# Patient Record
Sex: Male | Born: 1955 | Race: Black or African American | Hispanic: No | Smoking: Never smoker
Health system: Southern US, Community
[De-identification: ages and names within clinical notes are randomized; demographics above are authoritative.]

## PROBLEM LIST (undated history)

## (undated) DIAGNOSIS — I10 Essential (primary) hypertension: Secondary | ICD-10-CM

## (undated) DIAGNOSIS — I471 Supraventricular tachycardia, unspecified: Secondary | ICD-10-CM

## (undated) DIAGNOSIS — Z95810 Presence of automatic (implantable) cardiac defibrillator: Secondary | ICD-10-CM

## (undated) DIAGNOSIS — E785 Hyperlipidemia, unspecified: Secondary | ICD-10-CM

## (undated) DIAGNOSIS — H409 Unspecified glaucoma: Secondary | ICD-10-CM

## (undated) DIAGNOSIS — E119 Type 2 diabetes mellitus without complications: Secondary | ICD-10-CM

## (undated) DIAGNOSIS — J45909 Unspecified asthma, uncomplicated: Secondary | ICD-10-CM

## (undated) DIAGNOSIS — M549 Dorsalgia, unspecified: Secondary | ICD-10-CM

## (undated) DIAGNOSIS — I5022 Chronic systolic (congestive) heart failure: Secondary | ICD-10-CM

## (undated) DIAGNOSIS — I428 Other cardiomyopathies: Secondary | ICD-10-CM

## (undated) DIAGNOSIS — E669 Obesity, unspecified: Secondary | ICD-10-CM

## (undated) DIAGNOSIS — D869 Sarcoidosis, unspecified: Secondary | ICD-10-CM

## (undated) DIAGNOSIS — F32A Depression, unspecified: Secondary | ICD-10-CM

## (undated) DIAGNOSIS — F329 Major depressive disorder, single episode, unspecified: Secondary | ICD-10-CM

## (undated) DIAGNOSIS — I77810 Thoracic aortic ectasia: Secondary | ICD-10-CM

## (undated) DIAGNOSIS — G4730 Sleep apnea, unspecified: Secondary | ICD-10-CM

## (undated) DIAGNOSIS — I480 Paroxysmal atrial fibrillation: Secondary | ICD-10-CM

## (undated) DIAGNOSIS — I442 Atrioventricular block, complete: Secondary | ICD-10-CM

## (undated) HISTORY — PX: PACEMAKER INSERTION: SHX728

## (undated) HISTORY — PX: TONSILLECTOMY: SUR1361

## (undated) HISTORY — DX: Atrioventricular block, complete: I44.2

## (undated) HISTORY — DX: Unspecified glaucoma: H40.9

## (undated) HISTORY — DX: Depression, unspecified: F32.A

## (undated) HISTORY — DX: Essential (primary) hypertension: I10

## (undated) HISTORY — DX: Unspecified asthma, uncomplicated: J45.909

## (undated) HISTORY — DX: Obesity, unspecified: E66.9

## (undated) HISTORY — DX: Paroxysmal atrial fibrillation: I48.0

## (undated) HISTORY — DX: Supraventricular tachycardia: I47.1

## (undated) HISTORY — DX: Hyperlipidemia, unspecified: E78.5

## (undated) HISTORY — DX: Thoracic aortic ectasia: I77.810

## (undated) HISTORY — DX: Dorsalgia, unspecified: M54.9

## (undated) HISTORY — DX: Supraventricular tachycardia, unspecified: I47.10

## (undated) HISTORY — DX: Major depressive disorder, single episode, unspecified: F32.9

## (undated) HISTORY — DX: Sarcoidosis, unspecified: D86.9

## (undated) HISTORY — PX: HEMORRHOID SURGERY: SHX153

## (undated) HISTORY — DX: Type 2 diabetes mellitus without complications: E11.9

## (undated) HISTORY — DX: Other cardiomyopathies: I42.8

## (undated) HISTORY — DX: Chronic systolic (congestive) heart failure: I50.22

---

## 1997-04-10 ENCOUNTER — Encounter: Admission: RE | Admit: 1997-04-10 | Discharge: 1997-07-09 | Payer: Self-pay | Admitting: Internal Medicine

## 1999-07-30 ENCOUNTER — Emergency Department (HOSPITAL_COMMUNITY): Admission: EM | Admit: 1999-07-30 | Discharge: 1999-07-30 | Payer: Self-pay | Admitting: Emergency Medicine

## 1999-11-19 ENCOUNTER — Encounter: Payer: Self-pay | Admitting: Orthopedic Surgery

## 1999-11-19 ENCOUNTER — Encounter: Admission: RE | Admit: 1999-11-19 | Discharge: 1999-11-19 | Payer: Self-pay | Admitting: Orthopedic Surgery

## 2001-12-03 ENCOUNTER — Ambulatory Visit (HOSPITAL_BASED_OUTPATIENT_CLINIC_OR_DEPARTMENT_OTHER): Admission: RE | Admit: 2001-12-03 | Discharge: 2001-12-03 | Payer: Self-pay | Admitting: *Deleted

## 2002-07-11 ENCOUNTER — Encounter: Payer: Self-pay | Admitting: *Deleted

## 2002-07-11 ENCOUNTER — Encounter: Payer: Self-pay | Admitting: Emergency Medicine

## 2002-07-11 ENCOUNTER — Observation Stay (HOSPITAL_COMMUNITY): Admission: EM | Admit: 2002-07-11 | Discharge: 2002-07-12 | Payer: Self-pay | Admitting: Emergency Medicine

## 2002-12-25 ENCOUNTER — Ambulatory Visit (HOSPITAL_COMMUNITY): Admission: RE | Admit: 2002-12-25 | Discharge: 2002-12-25 | Payer: Self-pay | Admitting: Cardiology

## 2002-12-30 ENCOUNTER — Ambulatory Visit (HOSPITAL_COMMUNITY): Admission: RE | Admit: 2002-12-30 | Discharge: 2002-12-30 | Payer: Self-pay | Admitting: Gastroenterology

## 2003-01-17 ENCOUNTER — Emergency Department (HOSPITAL_COMMUNITY): Admission: EM | Admit: 2003-01-17 | Discharge: 2003-01-17 | Payer: Self-pay | Admitting: Emergency Medicine

## 2003-03-26 ENCOUNTER — Ambulatory Visit (HOSPITAL_BASED_OUTPATIENT_CLINIC_OR_DEPARTMENT_OTHER): Admission: RE | Admit: 2003-03-26 | Discharge: 2003-03-26 | Payer: Self-pay | Admitting: Internal Medicine

## 2003-05-21 ENCOUNTER — Encounter: Admission: RE | Admit: 2003-05-21 | Discharge: 2003-05-21 | Payer: Self-pay | Admitting: Internal Medicine

## 2003-06-17 ENCOUNTER — Encounter: Admission: RE | Admit: 2003-06-17 | Discharge: 2003-09-15 | Payer: Self-pay | Admitting: Internal Medicine

## 2003-10-23 ENCOUNTER — Encounter: Admission: RE | Admit: 2003-10-23 | Discharge: 2003-10-23 | Payer: Self-pay | Admitting: Internal Medicine

## 2004-02-22 ENCOUNTER — Emergency Department (HOSPITAL_COMMUNITY): Admission: EM | Admit: 2004-02-22 | Discharge: 2004-02-22 | Payer: Self-pay | Admitting: Emergency Medicine

## 2005-03-07 ENCOUNTER — Encounter: Admission: RE | Admit: 2005-03-07 | Discharge: 2005-03-07 | Payer: Self-pay | Admitting: Internal Medicine

## 2005-09-22 ENCOUNTER — Encounter: Admission: RE | Admit: 2005-09-22 | Discharge: 2005-09-22 | Payer: Self-pay | Admitting: Internal Medicine

## 2007-05-15 ENCOUNTER — Encounter: Admission: RE | Admit: 2007-05-15 | Discharge: 2007-05-15 | Payer: Self-pay | Admitting: Internal Medicine

## 2009-09-21 ENCOUNTER — Ambulatory Visit: Payer: Self-pay | Admitting: Internal Medicine

## 2009-09-21 ENCOUNTER — Inpatient Hospital Stay (HOSPITAL_COMMUNITY): Admission: EM | Admit: 2009-09-21 | Discharge: 2009-09-23 | Payer: Self-pay | Admitting: Emergency Medicine

## 2009-12-11 ENCOUNTER — Ambulatory Visit (HOSPITAL_COMMUNITY)
Admission: RE | Admit: 2009-12-11 | Discharge: 2009-12-12 | Payer: Self-pay | Source: Home / Self Care | Admitting: Cardiology

## 2010-03-22 LAB — GLUCOSE, CAPILLARY
Glucose-Capillary: 120 mg/dL — ABNORMAL HIGH (ref 70–99)
Glucose-Capillary: 152 mg/dL — ABNORMAL HIGH (ref 70–99)
Glucose-Capillary: 98 mg/dL (ref 70–99)

## 2010-03-22 LAB — SURGICAL PCR SCREEN
MRSA, PCR: NEGATIVE
Staphylococcus aureus: NEGATIVE

## 2010-03-25 LAB — GLUCOSE, CAPILLARY
Glucose-Capillary: 127 mg/dL — ABNORMAL HIGH (ref 70–99)
Glucose-Capillary: 140 mg/dL — ABNORMAL HIGH (ref 70–99)
Glucose-Capillary: 146 mg/dL — ABNORMAL HIGH (ref 70–99)
Glucose-Capillary: 189 mg/dL — ABNORMAL HIGH (ref 70–99)
Glucose-Capillary: 223 mg/dL — ABNORMAL HIGH (ref 70–99)

## 2010-03-25 LAB — POCT I-STAT, CHEM 8
BUN: 12 mg/dL (ref 6–23)
Calcium, Ion: 1.12 mmol/L (ref 1.12–1.32)
Chloride: 105 mEq/L (ref 96–112)
Creatinine, Ser: 1.1 mg/dL (ref 0.4–1.5)
Glucose, Bld: 294 mg/dL — ABNORMAL HIGH (ref 70–99)

## 2010-03-25 LAB — LIPID PANEL
Cholesterol: 134 mg/dL (ref 0–200)
Cholesterol: 139 mg/dL (ref 0–200)
HDL: 48 mg/dL (ref >39)
Total CHOL/HDL Ratio: 2.9 RATIO
VLDL: 25 mg/dL (ref 0–40)

## 2010-03-25 LAB — BRAIN NATRIURETIC PEPTIDE: Pro B Natriuretic peptide (BNP): 51 pg/mL (ref 0.0–100.0)

## 2010-03-25 LAB — CBC
Hemoglobin: 11.5 g/dL — ABNORMAL LOW (ref 13.0–17.0)
MCV: 77.8 fL — ABNORMAL LOW (ref 78.0–100.0)
RDW: 13.2 % (ref 11.5–15.5)

## 2010-03-25 LAB — TSH: TSH: 3.655 u[IU]/mL (ref 0.350–4.500)

## 2010-03-25 LAB — COMPREHENSIVE METABOLIC PANEL
Albumin: 3.5 g/dL (ref 3.5–5.2)
Alkaline Phosphatase: 104 U/L (ref 39–117)
BUN: 11 mg/dL (ref 6–23)
CO2: 25 mEq/L (ref 19–32)
Calcium: 8.7 mg/dL (ref 8.4–10.5)
GFR calc non Af Amer: >60 mL/min (ref >60)
Glucose, Bld: 287 mg/dL — ABNORMAL HIGH (ref 70–99)
Sodium: 133 mEq/L — ABNORMAL LOW (ref 135–145)
Total Protein: 7.3 g/dL (ref 6.0–8.3)

## 2010-03-25 LAB — HEMOGLOBIN A1C: Hgb A1c MFr Bld: 9.4 % — ABNORMAL HIGH (ref <5.7)

## 2010-03-25 LAB — POCT CARDIAC MARKERS
CKMB, poc: <1 ng/mL — ABNORMAL LOW (ref 1.0–8.0)
Myoglobin, poc: 41.6 ng/mL (ref 12–200)
Troponin i, poc: <0.05 ng/mL (ref 0.00–0.09)
Troponin i, poc: <0.05 ng/mL (ref 0.00–0.09)

## 2010-04-28 ENCOUNTER — Other Ambulatory Visit: Payer: Self-pay | Admitting: Orthopedic Surgery

## 2010-04-28 DIAGNOSIS — M545 Low back pain: Secondary | ICD-10-CM

## 2010-04-30 ENCOUNTER — Ambulatory Visit
Admission: RE | Admit: 2010-04-30 | Discharge: 2010-04-30 | Disposition: A | Discharge status: Home / Self Care | Payer: Medicare Other | Source: Ambulatory Visit | Attending: Orthopedic Surgery | Admitting: Orthopedic Surgery

## 2010-04-30 DIAGNOSIS — M545 Low back pain: Secondary | ICD-10-CM

## 2010-05-11 ENCOUNTER — Other Ambulatory Visit (HOSPITAL_COMMUNITY): Payer: Self-pay | Admitting: Neurosurgery

## 2010-05-11 DIAGNOSIS — M545 Low back pain, unspecified: Secondary | ICD-10-CM

## 2010-05-21 ENCOUNTER — Ambulatory Visit (HOSPITAL_COMMUNITY)
Admission: RE | Admit: 2010-05-21 | Discharge: 2010-05-21 | Disposition: A | Discharge status: Home / Self Care | Payer: Medicare Other | Source: Ambulatory Visit | Attending: Neurosurgery | Admitting: Neurosurgery

## 2010-05-21 DIAGNOSIS — M545 Low back pain: Secondary | ICD-10-CM

## 2010-05-21 DIAGNOSIS — M79609 Pain in unspecified limb: Secondary | ICD-10-CM | POA: Insufficient documentation

## 2010-05-21 DIAGNOSIS — M549 Dorsalgia, unspecified: Secondary | ICD-10-CM | POA: Insufficient documentation

## 2010-05-21 DIAGNOSIS — M48061 Spinal stenosis, lumbar region without neurogenic claudication: Secondary | ICD-10-CM | POA: Insufficient documentation

## 2010-05-21 LAB — GLUCOSE, CAPILLARY

## 2010-05-21 MED ORDER — IOHEXOL 180 MG/ML  SOLN
20.0000 mL | Freq: Once | INTRAMUSCULAR | Status: AC | PRN
  Administered 2010-05-21: 20 mL via INTRAVENOUS

## 2010-05-28 NOTE — H&P (Signed)
[name], [name]                      ACCOUNT NO.:  [id number]   MEDICAL RECORD NO.:  [id number]                   PATIENT TYPE:  OBV   LOCATION:  0347                                 FACILITY:  [facility]   PHYSICIAN:  Quita Skye. Waldon Reining, MD             DATE OF BIRTH:  [birth year]   DATE OF ADMISSION:  07/11/2002  DATE OF DISCHARGE:                                HISTORY & PHYSICAL   [name] is a 55 year old black male who was admitted to [facility] for further evaluation of chest pain.   The patient presented to the emergency department this evening with a one  week history of intermittent chest pain. The chest pain is described as a  pressure in the left anterior chest. It does not radiate. It is participated  by activity and is relieved with rest in approximately five minutes. He had  not experienced any episode at rest. Episodes are associated with dyspnea  and nausea but no diaphoresis. He has no other exacerbating or alleviating  factors. He has experienced episodes once or twice a day for the last 10  days. They have increased slightly in frequency since the onset 10 days ago.  The patient experienced chest pain at this time. The episodes appear not to  be related to position, meals, or respirations.   The patient has no past history of chest pain, myocardial infarction,  coronary artery disease, or congestive heart failure. He had a cardiac  catheterization in 1996 which reportedly demonstrated no coronary artery  disease. He underwent pacemaker implantation several years ago; it is not  clear what reason the pacemaker was implanted. He has a history of  noninsulin-dependent diabetes mellitus and a family history of early  coronary artery disease (father suffers from coronary artery disease in his  80s). There is no history of hyperlipidemia, smoking, or hypertension.   The patient is disabled due to degenerative disk disease and due to his  pacemaker. He was employed as a Copy.   The patient also has a history of sarcoid.   He is allergic to penicillin.   His current medications include:  1. Tambocor 50 mg p.o. b.i.d.  2. Avandia 8 mg p.o. daily.   The patient lives with his wife and children.   Review of systems reveals no problems related to his head, eyes, ears, nose,  mouth, throat, lungs, gastrointestinal system, genitourinary system, or  extremities. There is no history of neurological or psychiatric disorder.  There is no history of fever, chills, or weight loss.   PHYSICAL EXAMINATION:  VITAL SIGNS:  Blood pressure 160/98, pulse 104 and  regular, respirations 20, temperature 97.8.  GENERAL:  The patient was an obese, middle-aged, black male in no  discomfort. He is alert, oriented, appropriate, and responsive.  HEAD, EYES, EARS, NOSE AND MOUTH:  Normal.  NECK:  Without thyromegaly or adenopathy. Carotid pulses were palpable  bilaterally  without bruits.  CARDIAC:  Revealed a normal S1 and S2. There was no S3, S4, murmur, rub, or  click. Cardiac rhythm was regular. No chest wall tenderness was noted.  LUNGS:  Clear.  ABDOMEN:  Soft and nontender. There was no mass, hepatosplenomegaly, bruit,  distention, rebound, guarding, or rigidity. Bowel sounds were normal.  RECTAL/GENITAL EXAMINATIONS:  Not performed as they were not pertinent for  the reason for acute care hospitalization.  EXTREMITIES:  Without edema, deviation, or deformity. Radial and dorsalis  pedal pulses were palpable bilaterally.  NEUROLOGICAL:  Brief screening neurological survey was unremarkable.   Electrocardiogram revealed a paced rhythm. The chest radiograph report was  pending at the time of this dictation. Initial CK was 65 with a CK-MB of 1.0  and a troponin of 0.02. White count was 5.2 with a hemoglobin of 13.2 and  hematocrit 39.5. Potassium 3.7, BUN 11, creatinine 1.1. The remaining  studies were pending at the time  of this dictation.   IMPRESSION:  1. Chest pain, rule out coronary artery disease. The chest pain is described     as a left anterior chest pressure, participated by exertion and relieved     with rest in approximately five minutes. It has occurred once or twice a     day for the last 10 days. This history is consistent with angina.  2. Status post permanent pacemaker.  3. Noninsulin-dependent diabetes mellitus.  4. Sarcoid.   PLAN:  1. Telemetry.  2. Serial cardiac enzymes.  3. Aspirin.  4. Nitro paste.  5. Heparin.  6. Additional measures per Dr. Fraser Din.                                               Quita Skye. Waldon Reining, MD    MSC/MEDQ  D:  07/11/2002  T:  07/11/2002  Job:  [id number]   cc:   Meade Maw, M.D.  [address]  [place]  [place] [address]  Fax: [phone]

## 2010-05-28 NOTE — Op Note (Signed)
NAMEDELAINE, CANTER                      ACCOUNT NO.:  [id number]   MEDICAL RECORD NO.:  [id number]                   PATIENT TYPE:  OIB   LOCATION:  2899                                 FACILITY:  [facility]   PHYSICIAN:  Francisca December, M.D.               DATE OF BIRTH:  1955/12/06   DATE OF PROCEDURE:  12/25/2002  DATE OF DISCHARGE:                                 OPERATIVE REPORT   ADDENDUM:  The pacemaker representative has reassessed his interpretation of  the pacing data and in fact, the pacing threshold for the atrial lead is 0.5  V, 0.5 msec pulse width.  This results in a current at capture threshold of  1.6 mA, given that the lead impedance was 387 ohms.  Resulting longevity  assessment is greater than 10 years.                                               Francisca December, M.D.    JHE/MEDQ  D:  12/25/2002  T:  12/25/2002  Job:  [id number]

## 2010-05-28 NOTE — Op Note (Signed)
NAMERIGBY, [name]                      ACCOUNT NO.:  [id number]   MEDICAL RECORD NO.:  [id number]                   PATIENT TYPE:  AMB   LOCATION:  ENDO                                 FACILITY:  [facility]   PHYSICIAN:  Danise Edge, M.D.                DATE OF BIRTH:  December 14, 1955   DATE OF PROCEDURE:  12/30/2002  DATE OF DISCHARGE:                                 OPERATIVE REPORT   PROCEDURE:  Diagnostic colonoscopy.   PROCEDURE INDICATION:  [name] is a 55 year old male, born  [birth year].  [name] has guaiac positive stool and is referred for  his first diagnostic colonoscopy with polypectomy to prevent colon cancer.   ENDOSCOPIST:  Charolett Bumpers, M.D.   PREMEDICATION:  1. Versed 7.5 mg.  2. Demerol 100 mg.   DESCRIPTION OF PROCEDURE:  After obtaining informed consent, [name]  was placed in the left lateral decubitus position.  I administered  intravenous Demerol and intravenous Versed to achieve conscious sedation for  the procedure.  The patient's blood pressure, oxygen saturation, and cardiac  rhythm were monitored throughout the procedure and documented in the medical  record.   Anal inspection was normal.  Digital rectal examination was normal.  The  prostate was nonnodular.  The Olympus pediatric adjustable colonoscope was  introduced into the rectum and easily advanced to the cecum.  Colonic  preparation for the exam today was excellent.   RECTUM:  Normal.  SIGMOID COLON AND DESCENDING COLON:  Normal.  SPLENIC FLEXURE:  Normal.  TRANSVERSE COLON:  Normal.  HEPATIC FLEXURE:  Normal.  ASCENDING COLON:  Normal.  CECUM AND ILEOCECAL VALVE:  Normal.   ASSESSMENT:  1. Normal diagnostic proctocolonoscopy to the cecum.  2. No endoscopic evidence for the presence of colorectal neoplasia or     inflammatory bowel disease.                                               Danise Edge, M.D.    MJ/MEDQ  D:  12/30/2002  T:   12/30/2002  Job:  [id number]   cc:   Lilla Shook, M.D.  [address]  [place]  [place] [address]  Fax: [phone]

## 2010-05-28 NOTE — Discharge Summary (Signed)
[name], [name]                      ACCOUNT NO.:  [id number]   MEDICAL RECORD NO.:  [id number]                   PATIENT TYPE:  OBV   LOCATION:  0368                                 [facility]ACILITY:  [facility]   PHYSICIAN:  [name], M.D.                 DATE OF BIRTH:  1955/05/15   DATE OF ADMISSION:  07/11/2002  DATE OF DISCHARGE:  07/12/2002                                 DISCHARGE SUMMARY   PRIMARY CARE PHYSICIAN:  [name], M.D.   PROCEDURES:  A.  2D echocardiogram results detailed below.  B.  On (July 11, 2002) adenosine Cardiolite which was negative for ischemia,  ejection fraction of 20%.  Inferior scar.   DISCHARGE DIAGNOSES:  1. Cardiomyopathy with ejection fraction approximately 20%.  Ejection     fraction question related to sarcoidosis.  Adenosine Cardiolite this     admission was negative for evidence for ischemia.  Possible inferior     scar.  Negative cardiac valvular abnormalities.  No pericardial effusion.  2. Left atrium diameter okay at 41 millimeters.  Trivial TR.  Trivial MR.     Right ventricle not well-visualized; right atrium grossly normal.  The     patient ruled out for myocardial infarction by negative serial enzymes.     Negative congestive heart failure on chest x-ray though cardiac     enlargement.  The patient states prior cardiac catheterization in 1996     was negative for coronary artery disease.  3. Obesity with height 70 inches, weight 315 pounds.  4. Diabetes mellitus type 2.  Serum glucose this admission 339 to 184.  5. Dyslipidemia in setting of coronary artery disease; cholesterol 191,     triglycerides elevated at 156, HDL 39, LDL 121.  Further management     anticipated as an outpatient.  6. History of dual chamber pacemaker; the patient is a poor historian.  He     is on Tambocor.  Question prior ablation for supraventricular     tachyrhythmias.  7. History of sarcoid.   PLAN:  1. The patient discharged to home in  stable condition.  2. Options discussed with the patient; he wishes to have further workup as     an outpatient.  This includes plans for a left heart catheterization     assessing for evidence of coronary artery disease and right heart     catheterization for evaluation of pulmonary hypertension and hemodynamic     data.  3. Enroll in congestive heart failure clinic.  4. Medical management; continue aspirin 325 mg per day, initiation of Coreg     3.125 mg p.o. b.i.d. (new).  Continue Tambocor 15 mg p.o. b.i.d. and     Avandia 8 mg p.o. daily as prior to admission.   DISCHARGE ACTIVITIES:  No strenuous activity; no lifting more than 10  pounds.   FOLLOW UP:  1. The patient to call our  office after discharge for scheduling heart     catheterization.  2. Anticipate enrollment in our congestive heart failure clinic; he will     call our office for that appointment [phone]).  3. Follow up with rheumatology [name], as an outpatient.   HISTORY OF PRESENT ILLNESS:  [name] is a 55 year old gentleman  admitted to [name] by Dr. Darol Walsh for evaluation of  chest pain.  He has a history of obesity and sarcoid.  History of pacemaker  placement for which the patient was not clear on indication.  He is on  Tambocor.   The patient had been experiencing left anterior chest pressure, non  radiating, but precipitated by activity and relieved with rest.  Associated  dyspnea and nausea, but no diaphoresis.  These episodes were occurring daily  to b.i.d. for the last 10 days prior to admission, increasing in frequency.   The patient was admitted to telemetry and initiated on aspirin, intravenous  heparin, nitro paste.  He was given a total of 80 mEq of potassium for  admission potassium of 3.7.  Admission chest x-ray was negative for  congestive heart failure, but positive cardiomegaly.  He ruled out for a  myocardial infarction by negative serial  cardiac enzymes.  The patient had  no further chest pain during the course of his admission.  An followup  adenosine Cardiolite was negative for ischemia, but did reveal an ejection  fraction of 20% with inferior scar.  Options were discussed with the  patient.  [name] wished to have further workup as an outpatient.  Anticipate he will return for a left and right heart catheterization.  Dr.  Fraser Walsh intended for enrollment of patient in her congestive heart failure  clinic and follow up with rheumatology.  Discharge diagnosis of  cardiomyopathy, questionably related to sarcoidosis.  Dr. Fraser Walsh noted  inability to add calcium channel blockers to the patient's medical regimen  secondary to his taking Tambocor.   A followup 2D echocardiogram was performed which was negative for  pericardial effusion; only trivial MR; TR otherwise no significant cardiac  valvular abnormality.  Right atrium within normal size with inadequate  visualization of right ventricle.  Ejection fraction was estimated 15-20%.  Study was inadequate for left ventricular wall motion.  LV wall thickness  was normal.  There was no pericardial effusion.  Left atrial diameter was  okay at 41 millimeters.  End diastolic left ventricular diameter was 57.5  with left ventricular diameter and systolic 47.2.   LABORATORY DATA:  WBC 5.2, hemoglobin 13.2, hematocrit 39.5, platelets 263.  Differential within normal range.  Pro time 13.3, INR 1, PTT 31.  Sodium  136, potassium 3.7, chloride 106, CO2 24, glucose 339, subsequently 184.  BUN 11, creatinine 1.1.  Liver function tests all within normal range.  Serial cardiac enzymes including CK/MB and troponin were all totally within  normal range.  Serum cholesterol of 191, triglyceride slightly elevated at  156, HDL 39, LDL 121.  EKG revealed ventricularly paced rhythm, some atrial paced beats of varying PR intervals, question of underlying AV heart block.  Chest x-ray July 11, 2002 revealed cardiac enlargement.  Dual lead pacemaker  in place with leads in the region of right atrium and right ventricle.  No  infiltrate or effusion.   PRECYCLE HISTORY:  As above.       [name], N.P.  [name], M.D.    MES/MEDQ  D:  09/07/2002  T:  09/08/2002  Job:  [id number]   cc:   Thayer Headings, M.D.  Int. Med. - Resident - [address]  [place], [place] [address]  Fax: [phone]

## 2010-05-28 NOTE — Op Note (Signed)
[name], [name]                      ACCOUNT NO.:  [id number]   MEDICAL RECORD NO.:  [id number]                   PATIENT TYPE:  OIB   LOCATION:  2899                                 FACILITY:  [facility]   PHYSICIAN:  [name], M.D.               DATE OF BIRTH:  [birth year]   DATE OF PROCEDURE:  12/25/2002  DATE OF DISCHARGE:                                 OPERATIVE REPORT   PROCEDURE PERFORMED:  1. Temporary transvenous pacemaker.  2. Removal of old pacing generator.  3. Insertion new pacing generator dual chamber.  4. Lead testing.   INDICATIONS FOR PROCEDURE:  [name] is a pleasant 55 year old man with  a history of atrial flutter, who was status post ablation and subsequently  required permanent pacemaker insertion approximately seven years ago.  It is  now at elective replacement interval.  He has a nonischemic cardiomyopathy  with an ejection fraction of 15 to 20% by echocardiogram, July 2004.  He is  brought now to the cardiac catheterization laboratory for removal and  insertion of a new pacing generator.  Because he is pacemaker dependent, he  will have a transvenous pacemaker placed from the right groin temporarily.   DESCRIPTION OF PROCEDURE:  The patient was brought to the cardiac  catheterization laboratory in a fasting state.  The right groin and left  prepectoral region was prepped and draped in the usual sterile fashion.  Local anesthesia was obtained with infiltration of 1% lidocaine.  A 6 French  catheter sheath was inserted percutaneously into the right femoral artery  utilizing an anterior approach over a guiding J-wire.  A 100 cm balloon flow  directed temporary transvenous pacemaker was then manipulated into the right  ventricular apex using fluoroscopy where adequate pacing parameters were  obtained.  The pacing generator was placed at a rate of 55 beats per minute  on standby.  After changing gown and glove, attention was directed to the  left prepectoral region.  There again local anesthesia was obtained with  infiltration of 1% lidocaine with epinephrine throughout.  A 7 cm incision  was then made along the previous pacemaker scar.  This was carried down by  sharp dissection to the pacemaker capsule.  The capsule was incised and the  pacemaker subsequently delivered without difficulty.  The leads were  detached from the pacing generator.  The patient was stable on temporary  pacing at that time.  Each lead was tested with results as noted below,  which were adequate.  The pocket was then copiously irrigated using 1%  Kanamycin solution.  The leads were then attached to the pacing generator  and the generator then tested for security.  Each lead was identified by its  serial number and placed in the appropriate receptacle.  Each was tightened  and placed carefully and tested for security.  The pacemaker generator was  then placed in a pocket and the pocket  was inspected for bleeding.  None was  found.  The pocket was then closed using 2-0 Dexon in a running fashion for  the subcutaneous layer.  The skin was approximated using 5-0 Dexon in a  running subcuticular fashion.  Steri-Strips and a sterile dressing were  applied and the patient was transported to recovery area in stable condition  in an A sense V pace mode.   EQUIPMENT DATA:  The old pacing generator is a Production assistant, radio [id number],  serial [id number].  The new pulse generator is a Designer, jewellery model [id number],  serial [id number], Identity [id number].  The atrial lead is a Medtronic model  [id number], serial [id number]. The ventricular lead is a Medtronic model [id number],  serial [id number].   PACING DATA:  The right atrial lead had a pacing threshold of 1.5 V.  The  resistance was 540 ohms at a pulse width of 0.5 msec.  The P wave amplitude  detected was 2 mV.  The right ventricular lead had a pacing threshold of  0.5 V at 0.5 msec.  The resistance was 580 ohms.  This  resulted in a current  at capture threshold of 0.9 mA.  The lead was not tested for R wave  sensitivity since the patient was in complete heart block and temporarily  pacing.  Finally, the current at capture threshold for the right atrial lead  was 3 mA.                                               [name], M.D.    JHE/MEDQ  D:  12/25/2002  T:  12/25/2002  Job:  [id number]   cc:   [name], M.D.  [address]  [place], [place] [address]  Fax: [phone]

## 2010-05-31 ENCOUNTER — Other Ambulatory Visit: Payer: Self-pay | Admitting: Neurosurgery

## 2010-05-31 DIAGNOSIS — M47816 Spondylosis without myelopathy or radiculopathy, lumbar region: Secondary | ICD-10-CM

## 2010-06-02 ENCOUNTER — Ambulatory Visit
Admission: RE | Admit: 2010-06-02 | Discharge: 2010-06-02 | Disposition: A | Discharge status: Home / Self Care | Payer: Medicare Other | Source: Ambulatory Visit | Attending: Neurosurgery | Admitting: Neurosurgery

## 2010-06-02 ENCOUNTER — Other Ambulatory Visit: Payer: Self-pay | Admitting: Neurosurgery

## 2010-06-02 DIAGNOSIS — M47816 Spondylosis without myelopathy or radiculopathy, lumbar region: Secondary | ICD-10-CM

## 2010-06-16 ENCOUNTER — Other Ambulatory Visit: Payer: Medicare Other

## 2010-07-15 ENCOUNTER — Ambulatory Visit
Admission: RE | Admit: 2010-07-15 | Discharge: 2010-07-15 | Disposition: A | Discharge status: Home / Self Care | Payer: Medicare Other | Source: Ambulatory Visit | Attending: Neurosurgery | Admitting: Neurosurgery

## 2010-07-15 DIAGNOSIS — M47816 Spondylosis without myelopathy or radiculopathy, lumbar region: Secondary | ICD-10-CM

## 2011-03-28 ENCOUNTER — Encounter: Payer: Self-pay | Admitting: Internal Medicine

## 2011-03-28 ENCOUNTER — Ambulatory Visit (INDEPENDENT_AMBULATORY_CARE_PROVIDER_SITE_OTHER): Payer: Medicare Other | Admitting: Internal Medicine

## 2011-03-28 VITALS — BP 120/82 | HR 74 | Wt 173.8 lb

## 2011-03-28 DIAGNOSIS — I428 Other cardiomyopathies: Secondary | ICD-10-CM

## 2011-03-28 DIAGNOSIS — Z4502 Encounter for adjustment and management of automatic implantable cardiac defibrillator: Secondary | ICD-10-CM

## 2011-03-28 DIAGNOSIS — Z95810 Presence of automatic (implantable) cardiac defibrillator: Secondary | ICD-10-CM | POA: Insufficient documentation

## 2011-03-28 DIAGNOSIS — I442 Atrioventricular block, complete: Secondary | ICD-10-CM

## 2011-03-28 DIAGNOSIS — I5022 Chronic systolic (congestive) heart failure: Secondary | ICD-10-CM

## 2011-03-28 LAB — ICD DEVICE OBSERVATION
AL AMPLITUDE: 2.1 mv
AL IMPEDENCE ICD: 410 Ohm
AL THRESHOLD: 0.5 V
ATRIAL PACING ICD: 6.4 pct
DEVICE MODEL ICD: 629155
VENTRICULAR PACING ICD: 99 pct

## 2011-03-28 NOTE — Assessment & Plan Note (Signed)
Today had discussed the importance of weight loss with the patient and his wife. He has seen a nutritionist. He is not exercising. [facility] In addition he is instructed to reduce the fat in his diet.

## 2011-03-28 NOTE — Assessment & Plan Note (Signed)
His symptoms are class 2-3. He will continue his current medical therapy, maintain a low-sodium diet, and he has been asked to lose weight.

## 2011-03-28 NOTE — Patient Instructions (Addendum)
Remote monitoring is used to monitor your Pacemaker of ICD from home. This monitoring reduces the number of office visits required to check your device to one time per year. It allows us to keep an eye on the functioning of your device to ensure it is working properly. You are scheduled for a device check from home on June 30, 2011. You may send your transmission at any time that day. If you have a wireless device, the transmission will be sent automatically. After your physician reviews your transmission, you will receive a postcard with your next transmission date.  Your physician wants you to follow-up in: 1 year with Dr Taylor. You will receive a reminder letter in the mail two months in advance. If you don't receive a letter, please call our office to schedule the follow-up appointment.  

## 2011-03-28 NOTE — Assessment & Plan Note (Signed)
His device is working normally. He does not have a functioning left ventricular lead. 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 Because of his comorbidities, I would not recommend an attempt at placing a left ventricular lead at this time. We'll plan to recheck his device in several months.

## 2011-03-28 NOTE — Progress Notes (Signed)
HPI [name] is referred today by Dr. [name] for ongoing evaluation and management of his ICD. The patient has a long-standing nonischemic cardiomyopathy and chronic congestive heart failure. He also has high-grade heart block and is status post initial pacemaker insertion. He had an upgrade to an ICD several years ago. Attempts to place a left ventricular lead were unsuccessful. Despite RV pacing, his heart failure has been fairly well controlled. He has class II symptoms. He is limited by his obesity and arthritis. The patient denies chest pain. He has class III symptoms but is limited more by arthritis including back pain and knee pain. He admits to dietary indiscretion. He has had no recent ICD shock. He denies peripheral edema. No syncope. Allergies  Allergen Reactions  . Penicillins Other (See Comments)    dizziness     Current Outpatient Prescriptions  Medication Sig Dispense Refill  . albuterol (PROVENTIL HFA;VENTOLIN HFA) 108 (90 BASE) MCG/ACT inhaler Inhale 2 puffs into the lungs every 6 (six) hours as needed.      Marland Kitchen aspirin 81 MG tablet Take 81 mg by mouth daily.      . carvedilol (COREG) 25 MG tablet Take 25 mg by mouth 2 (two) times daily with a meal.      . furosemide (LASIX) 20 MG tablet Take 20 mg by mouth daily.      Marland Kitchen glimepiride (AMARYL) 2 MG tablet Take 2 mg by mouth daily before breakfast.      . insulin glargine (LANTUS) 100 UNIT/ML injection Inject into the skin as directed. 100 units and then 50 units      . insulin lispro (HUMALOG) 100 UNIT/ML injection Inject 100 Units into the skin as needed.      . simvastatin (ZOCOR) 40 MG tablet Take 40 mg by mouth every evening.      . valsartan (DIOVAN) 320 MG tablet Take 320 mg by mouth daily.         Past Medical History  Diagnosis Date  . DM (diabetes mellitus)   . Nonischemic cardiomyopathy     EF of 15%  . HTN (hypertension)   . Hyperlipidemia   . Sarcoid   . Obesity   . CHF (congestive heart failure)   .  Back pain     ROS:   All systems reviewed and negative except as noted in the HPI.   Past Surgical History  Procedure Date  . Pacemaker insertion      No family history on file.   History   Social History  . Marital Status: Married    Spouse Name: N/A    Number of Children: N/A  . Years of Education: N/A   Occupational History  . Not on file.   Social History Main Topics  . Smoking status: Never Smoker   . Smokeless tobacco: Not on file  . Alcohol Use: Yes     occasionally  . Drug Use: No  . Sexually Active: Not on file   Other Topics Concern  . Not on file   Social History Narrative  . No narrative on file     BP 120/82  Pulse 74  Wt 78.835 kg (173 lb 12.8 oz)  Physical Exam:  Obese, middle-aged but otherwise well appearing man, NAD HEENT: Unremarkable Neck:  7 cm JVD, no thyromegally Lungs:  Clear with minimal rales in the bases. No wheezes or rhonchi. HEART:  Regular rate rhythm, no murmurs, no rubs, no clicks Abd:  Obese, soft, positive bowel sounds,  no organomegally, no rebound, no guarding Ext:  2 plus pulses, 1+ peripheral edema, no cyanosis, no clubbing Skin:  No rashes no nodules Neuro:  CN II through XII intact, motor grossly intact  DEVICE  Normal device function.  See PaceArt for details.   Assess/Plan:

## 2011-06-30 ENCOUNTER — Encounter: Payer: Medicare Other | Admitting: *Deleted

## 2011-07-11 ENCOUNTER — Encounter: Payer: Self-pay | Admitting: *Deleted

## 2011-07-18 ENCOUNTER — Ambulatory Visit (INDEPENDENT_AMBULATORY_CARE_PROVIDER_SITE_OTHER): Payer: Medicare Other | Admitting: *Deleted

## 2011-07-18 ENCOUNTER — Telehealth: Payer: Self-pay | Admitting: Internal Medicine

## 2011-07-18 ENCOUNTER — Encounter: Payer: Self-pay | Admitting: Internal Medicine

## 2011-07-18 DIAGNOSIS — Z4502 Encounter for adjustment and management of automatic implantable cardiac defibrillator: Secondary | ICD-10-CM

## 2011-07-18 DIAGNOSIS — I5022 Chronic systolic (congestive) heart failure: Secondary | ICD-10-CM

## 2011-07-18 NOTE — Telephone Encounter (Signed)
Please return call to patient at [phone] or [phone] regarding pacer ck questions.

## 2011-07-19 NOTE — Telephone Encounter (Signed)
Spoke with patient, he will be released from [facility] then we can process his latest transmission for MD to read.  We will send him a letter informing him of results along with next transmission date.

## 2011-07-22 LAB — REMOTE ICD DEVICE
AL AMPLITUDE: 2 mv
ATRIAL PACING ICD: 9.8 pct
BAMS-0003: 70 {beats}/min
BRDY-0002RV: 60 {beats}/min
DEVICE MODEL ICD: 629155
RV LEAD IMPEDENCE ICD: 380 Ohm
TZAT-0012SLOWVT: 200 ms
TZAT-0013SLOWVT: 3
TZAT-0019SLOWVT: 7.5 V
TZAT-0020SLOWVT: 1 ms
TZON-0004SLOWVT: 16
TZON-0005SLOWVT: 6
TZST-0001SLOWVT: 4
TZST-0003SLOWVT: 36 J

## 2011-08-05 ENCOUNTER — Encounter: Payer: Self-pay | Admitting: *Deleted

## 2011-08-27 ENCOUNTER — Emergency Department (HOSPITAL_COMMUNITY)
Admission: EM | Admit: 2011-08-27 | Discharge: 2011-08-27 | Disposition: A | Discharge status: Home / Self Care | Payer: Medicare Other | Attending: Emergency Medicine | Admitting: Emergency Medicine

## 2011-08-27 ENCOUNTER — Encounter (HOSPITAL_COMMUNITY): Payer: Self-pay | Admitting: *Deleted

## 2011-08-27 ENCOUNTER — Emergency Department (HOSPITAL_COMMUNITY): Payer: Medicare Other

## 2011-08-27 DIAGNOSIS — Z7982 Long term (current) use of aspirin: Secondary | ICD-10-CM | POA: Insufficient documentation

## 2011-08-27 DIAGNOSIS — E785 Hyperlipidemia, unspecified: Secondary | ICD-10-CM | POA: Insufficient documentation

## 2011-08-27 DIAGNOSIS — I509 Heart failure, unspecified: Secondary | ICD-10-CM | POA: Insufficient documentation

## 2011-08-27 DIAGNOSIS — E119 Type 2 diabetes mellitus without complications: Secondary | ICD-10-CM | POA: Insufficient documentation

## 2011-08-27 DIAGNOSIS — M549 Dorsalgia, unspecified: Secondary | ICD-10-CM

## 2011-08-27 DIAGNOSIS — Z794 Long term (current) use of insulin: Secondary | ICD-10-CM | POA: Insufficient documentation

## 2011-08-27 DIAGNOSIS — Z880 Allergy status to penicillin: Secondary | ICD-10-CM | POA: Insufficient documentation

## 2011-08-27 DIAGNOSIS — I10 Essential (primary) hypertension: Secondary | ICD-10-CM | POA: Insufficient documentation

## 2011-08-27 DIAGNOSIS — Z79899 Other long term (current) drug therapy: Secondary | ICD-10-CM | POA: Insufficient documentation

## 2011-08-27 DIAGNOSIS — R109 Unspecified abdominal pain: Secondary | ICD-10-CM | POA: Insufficient documentation

## 2011-08-27 LAB — URINALYSIS, ROUTINE W REFLEX MICROSCOPIC
Bilirubin Urine: NEGATIVE
Nitrite: NEGATIVE
Specific Gravity, Urine: 1.019 (ref 1.005–1.030)
Urobilinogen, UA: 0.2 mg/dL (ref 0.0–1.0)

## 2011-08-27 MED ORDER — CYCLOBENZAPRINE HCL 10 MG PO TABS: 10.0000 mg | ORAL_TABLET | Freq: Two times a day (BID) | ORAL | Status: AC | PRN

## 2011-08-27 MED ORDER — HYDROCODONE-ACETAMINOPHEN 5-500 MG PO TABS: 1.0000 | ORAL_TABLET | Freq: Four times a day (QID) | ORAL | Status: AC | PRN

## 2011-08-27 NOTE — ED Provider Notes (Signed)
Medical screening examination/treatment/procedure(s) were performed by non-physician practitioner and as supervising physician I was immediately available for consultation/collaboration.   Amahia Madonia, MD 08/27/11 1459 

## 2011-08-27 NOTE — ED Notes (Signed)
NAD noted at time of d/c home 

## 2011-08-27 NOTE — ED Provider Notes (Signed)
History     CSN: [id number]  Arrival date & time 08/27/11  0941   First MD Initiated Contact with Patient 08/27/11 1110      Chief Complaint  Patient presents with  . Flank Pain    (Consider location/radiation/quality/duration/timing/severity/associated sxs/prior treatment) Patient is a 56 y.o. male presenting with flank pain. The history is provided by the patient.  Flank Pain This is a new problem. The current episode started in the past 7 days. The problem occurs constantly. The problem has been unchanged. Associated symptoms include coughing. Pertinent negatives include no abdominal pain, chest pain, chills, fever, nausea, neck pain, numbness, urinary symptoms or weakness. The symptoms are aggravated by bending and twisting. He has tried NSAIDs for the symptoms. The treatment provided mild relief.  Pt states pain in the right flank/ mid back. States noted it when woke up 5 days ago. Pain radiates into right hip. No abdominal pain, no urinary symptoms. Does have a cough. NO fever, chills, malaise. No known injuries. No hx of the same.   Past Medical History  Diagnosis Date  . DM (diabetes mellitus)   . Nonischemic cardiomyopathy     EF of 15%  . HTN (hypertension)   . Hyperlipidemia   . Sarcoid   . Obesity   . CHF (congestive heart failure)   . Back pain     Past Surgical History  Procedure Date  . Pacemaker insertion     History reviewed. No pertinent family history.  History  Substance Use Topics  . Smoking status: Never Smoker   . Smokeless tobacco: Not on file  . Alcohol Use: Yes     occasionally      Review of Systems  Constitutional: Negative for fever and chills.  HENT: Negative for neck pain and neck stiffness.   Respiratory: Positive for cough.   Cardiovascular: Negative for chest pain.  Gastrointestinal: Negative for nausea and abdominal pain.  Genitourinary: Positive for flank pain. Negative for dysuria, urgency and hematuria.  Musculoskeletal:  Positive for back pain.  Skin: Negative.   Neurological: Negative for dizziness, weakness and numbness.    Allergies  Penicillins  Home Medications   Current Outpatient Rx  Name Route Sig Dispense Refill  . ALBUTEROL SULFATE HFA 108 (90 BASE) MCG/ACT IN AERS Inhalation Inhale 2 puffs into the lungs every 6 (six) hours as needed. For shortness of breath    . ASPIRIN 81 MG PO TABS Oral Take 81 mg by mouth daily.    Marland Kitchen CARVEDILOL 25 MG PO TABS Oral Take 50 mg by mouth 2 (two) times daily with a meal.     . FUROSEMIDE 20 MG PO TABS Oral Take 20 mg by mouth every evening.     Marland Kitchen GLIMEPIRIDE 2 MG PO TABS Oral Take 2 mg by mouth daily at 12 noon.     . INSULIN GLARGINE 100 UNIT/ML [place] SOLN Subcutaneous Inject 50 Units into the skin 2 (two) times daily. 100 units and then 50 units    . INSULIN LISPRO (HUMAN) 100 UNIT/ML [place] SOLN Subcutaneous Inject 5-10 Units into the skin 3 (three) times daily before meals. Sliding scale    . OLOPATADINE HCL 0.2 % OP SOLN Ophthalmic Apply 1 drop to eye daily as needed. For runny eyes    . SIMVASTATIN 40 MG PO TABS Oral Take 40 mg by mouth every evening.    Marland Kitchen TRAMADOL HCL 50 MG PO TABS Oral Take 50 mg by mouth every 6 (six) hours as needed.  For pain    . VALSARTAN 320 MG PO TABS Oral Take 320 mg by mouth every evening.       BP 152/79  Pulse 80  Temp 98.4 F (36.9 C) (Oral)  Resp 20  SpO2 95%  Physical Exam  Nursing note and vitals reviewed. Constitutional: He is oriented to person, place, and time. He appears well-developed and well-nourished.       Morbidly obese  HENT:  Head: Normocephalic.  Cardiovascular: Normal rate and regular rhythm.   Pulmonary/Chest: Effort normal and breath sounds normal. No respiratory distress. He has no wheezes. He has no rales.  Abdominal: Soft. Bowel sounds are normal. He exhibits no distension. There is no tenderness. There is no rebound.       Obese, no CVA tenderness  Musculoskeletal: Normal range of motion. He  exhibits no edema.       No pain with bilateral straight leg raise. Full ROM at hip joint as allowed by pts body habitus. No tenderness over thoracic or lumbar spine midline, or paraspinal muscles. Normal dorsal pedal pulses bilat  Neurological: He is alert and oriented to person, place, and time.       5/5 and equal LE strength, 5/5 strength with dorsiflexion, plantar flexions of feet bilat. 2+ patellar reflexes bilat  Skin: Skin is warm and dry.  Psychiatric: He has a normal mood and affect.    ED Course  Procedures (including critical care time)  Pt with right mid back pain, reproduced with twisting of the torso and leaning forward. No neuro deficits. Normal LE sensation and strength. No abdominal pain. No urinary symptoms. No red flags to suggest cauda equina. No fever. WIll get UA and CXR.   Results for orders placed during the hospital encounter of 08/27/11  URINALYSIS, ROUTINE W REFLEX MICROSCOPIC      Component Value Range   Color, Urine YELLOW  YELLOW   APPearance CLEAR  CLEAR   Specific Gravity, Urine 1.019  1.005 - 1.030   pH 5.5  5.0 - 8.0   Glucose, UA NEGATIVE  NEGATIVE mg/dL   Hgb urine dipstick NEGATIVE  NEGATIVE   Bilirubin Urine NEGATIVE  NEGATIVE   Ketones, ur NEGATIVE  NEGATIVE mg/dL   Protein, ur NEGATIVE  NEGATIVE mg/dL   Urobilinogen, UA 0.2  0.0 - 1.0 mg/dL   Nitrite NEGATIVE  NEGATIVE   Leukocytes, UA NEGATIVE  NEGATIVE   Dg Chest 2 View  08/27/2011  *RADIOLOGY REPORT*  Clinical Data: Right-sided flank pain for 5 days.  History of pacemaker/defibrillator.  Diabetes.  Hypertension.  CHF.  CHEST - 2 VIEW  Comparison: 12/12/2009  Findings: Pacer / AICD device.  Leads unchanged position, without lead discontinuity.  Midline trachea.  Moderate cardiomegaly. No pleural effusion or pneumothorax.  Mild left base scarring suspected. No congestive failure.  IMPRESSION: Cardiomegaly, without congestive failure or acute disease.  Original Report Authenticated By: Consuello Bossier, M.D.    12:52 PM UA negative. CXR shows cardiomegaly with no acute findings. Pt non toxic, no acute distress. No abdominal pain or tenderness, no neuro deficits, normal distal pulses. I suspect pt's pain is muscular, will try muscle relaxant, pain medications, and follow up with pcp in 2 days.     1. Back pain       MDM          Lottie Mussel, PA 08/27/11 1255

## 2011-08-27 NOTE — ED Notes (Addendum)
Pt states pain started on Monday in right flank, denies urinary burning/urgency/frequency/blood. States pain 'comes and goes'. Pain radiates into right hip/RLQ/pelvis. Pt states OTC aleve helps with the pain

## 2011-08-27 NOTE — ED Notes (Signed)
Pt reports right flank/back pain that started on Monday, denies injury to back, denies urinary symptoms.

## 2011-10-24 ENCOUNTER — Ambulatory Visit (INDEPENDENT_AMBULATORY_CARE_PROVIDER_SITE_OTHER): Payer: Medicare Other | Admitting: *Deleted

## 2011-10-24 DIAGNOSIS — Z4502 Encounter for adjustment and management of automatic implantable cardiac defibrillator: Secondary | ICD-10-CM

## 2011-10-24 DIAGNOSIS — I5022 Chronic systolic (congestive) heart failure: Secondary | ICD-10-CM

## 2011-10-25 ENCOUNTER — Encounter: Payer: Self-pay | Admitting: Internal Medicine

## 2011-10-25 ENCOUNTER — Encounter: Payer: Self-pay | Admitting: *Deleted

## 2011-10-25 LAB — REMOTE ICD DEVICE
AL AMPLITUDE: 1.6 mv
BAMS-0001: 160 {beats}/min
DEVICE MODEL ICD: 629155
HV IMPEDENCE: 46 Ohm
RV LEAD IMPEDENCE ICD: 410 Ohm
TZAT-0001SLOWVT: 1
TZAT-0004SLOWVT: 8
TZON-0003SLOWVT: 350 ms
TZON-0010SLOWVT: 40 ms
TZST-0001SLOWVT: 3
TZST-0001SLOWVT: 5
TZST-0003SLOWVT: 25 J
TZST-0003SLOWVT: 40 J

## 2011-11-30 ENCOUNTER — Encounter: Payer: Self-pay | Admitting: *Deleted

## 2012-01-30 ENCOUNTER — Ambulatory Visit (INDEPENDENT_AMBULATORY_CARE_PROVIDER_SITE_OTHER): Payer: Medicare Other | Admitting: *Deleted

## 2012-01-30 ENCOUNTER — Encounter: Payer: Self-pay | Admitting: Internal Medicine

## 2012-01-30 DIAGNOSIS — I428 Other cardiomyopathies: Secondary | ICD-10-CM

## 2012-01-30 DIAGNOSIS — I5022 Chronic systolic (congestive) heart failure: Secondary | ICD-10-CM

## 2012-01-30 DIAGNOSIS — Z4502 Encounter for adjustment and management of automatic implantable cardiac defibrillator: Secondary | ICD-10-CM

## 2012-01-30 LAB — REMOTE ICD DEVICE
AL AMPLITUDE: 0.5 mv
AL IMPEDENCE ICD: 410 Ohm
BAMS-0003: 70 {beats}/min
BRDY-0002RV: 60 {beats}/min
BRDY-0003RV: 120 {beats}/min
RV LEAD IMPEDENCE ICD: 410 Ohm
TZAT-0012SLOWVT: 200 ms
TZAT-0013SLOWVT: 3
TZAT-0018SLOWVT: NEGATIVE
TZAT-0019SLOWVT: 7.5 V
TZAT-0020SLOWVT: 1 ms
TZON-0004SLOWVT: 16
TZON-0005SLOWVT: 6
TZST-0001SLOWVT: 2
TZST-0001SLOWVT: 4
TZST-0003SLOWVT: 36 J

## 2012-02-09 ENCOUNTER — Encounter: Payer: Self-pay | Admitting: *Deleted

## 2012-04-12 ENCOUNTER — Encounter: Payer: Self-pay | Admitting: Internal Medicine

## 2012-04-12 ENCOUNTER — Ambulatory Visit (INDEPENDENT_AMBULATORY_CARE_PROVIDER_SITE_OTHER): Payer: Medicare Other | Admitting: Internal Medicine

## 2012-04-12 VITALS — BP 152/87 | HR 72 | Ht 71.0 in | Wt 365.4 lb

## 2012-04-12 DIAGNOSIS — Z4502 Encounter for adjustment and management of automatic implantable cardiac defibrillator: Secondary | ICD-10-CM

## 2012-04-12 DIAGNOSIS — I5022 Chronic systolic (congestive) heart failure: Secondary | ICD-10-CM

## 2012-04-12 LAB — ICD DEVICE OBSERVATION
AL AMPLITUDE: 1.5 mv
BAMS-0001: 160 {beats}/min
CHARGE TIME: 9.5 s
DEVICE MODEL ICD: 629155
FVT: 0
HV IMPEDENCE: 43 Ohm
MODE SWITCH EPISODES: 19
RV LEAD AMPLITUDE: 12 mv
RV LEAD THRESHOLD: 0.625 V
TOT-0007: 1
TOT-0010: 8
TZAT-0001SLOWVT: 1
TZAT-0018SLOWVT: NEGATIVE
TZAT-0019SLOWVT: 7.5 V
TZON-0003SLOWVT: 350 ms
TZON-0004SLOWVT: 16
TZON-0010SLOWVT: 40 ms
TZST-0001SLOWVT: 2
TZST-0001SLOWVT: 4
TZST-0003SLOWVT: 25 J
TZST-0003SLOWVT: 40 J
VENTRICULAR PACING ICD: 99.53 pct
VF: 0

## 2012-04-12 NOTE — Assessment & Plan Note (Signed)
The patient remains morbidly obese. I discussed with him the importance of weight loss, and maintain a low-sodium diet.

## 2012-04-12 NOTE — Patient Instructions (Signed)
Remote monitoring is used to monitor your Pacemaker of ICD from home. This monitoring reduces the number of office visits required to check your device to one time per year. It allows [place] to keep an eye on the functioning of your device to ensure it is working properly. You are scheduled for a device check from home on 07/16/12. You may send your transmission at any time that day. If you have a wireless device, the transmission will be sent automatically. After your physician reviews your transmission, you will receive a postcard with your next transmission date.  Your physician wants you to follow-up in: 1 year with Dr. Ladona Ridgel. You will receive a reminder letter in the mail two months in advance. If you don't receive a letter, please call our office to schedule the follow-up appointment.  Your physician recommends that you continue on your current medications as directed. Please refer to the Current Medication list given to you today.

## 2012-04-12 NOTE — Assessment & Plan Note (Signed)
His [employer] biventricular device with dual-chamber pacing is working normally. He does not have a left ventricular lead. We'll plan to recheck in several months. His device demonstrates elevation of his fluid index, and I've asked the patient increase his dose of diuretic for 3 days.

## 2012-04-12 NOTE — Assessment & Plan Note (Signed)
Despite his massive obesity, his heart failure symptoms remain class II.he will continue his current medical therapy, and I've encouraged the patient to maintain a low-sodium diet.

## 2012-04-12 NOTE — Progress Notes (Signed)
HPI [name] returns today for followup. He is a very pleasant 57 year old man with morbid obesity, a nonischemic cardiomyopathy, complete heart block, status post ICD implantation. When his initial ICD was placed, the patient was unable to receive a left ventricular lead. In the interim, he has been stable. He denies chest pain or shortness of breath. He has minimal peripheral edema. He denies syncope. He is frustrated by his inability to lose and keep off weight. Allergies  Allergen Reactions  . Penicillins Other (See Comments)    dizziness     Current Outpatient Prescriptions  Medication Sig Dispense Refill  . albuterol (PROVENTIL HFA;VENTOLIN HFA) 108 (90 BASE) MCG/ACT inhaler Inhale 2 puffs into the lungs every 6 (six) hours as needed. For shortness of breath      . aspirin 81 MG tablet Take 81 mg by mouth daily.      . carvedilol (COREG) 25 MG tablet Take 50 mg by mouth 2 (two) times daily with a meal.       . furosemide (LASIX) 20 MG tablet Take 20 mg by mouth every evening.       Marland Kitchen glimepiride (AMARYL) 2 MG tablet Take 2 mg by mouth daily at 12 noon.       . insulin glargine (LANTUS) 100 UNIT/ML injection Inject 50 Units into the skin 2 (two) times daily. 100 units and then 50 units      . insulin lispro (HUMALOG) 100 UNIT/ML injection Inject 5-10 Units into the skin 3 (three) times daily before meals. Sliding scale      . Olopatadine HCl (PATADAY) 0.2 % SOLN Apply 1 drop to eye daily as needed. For runny eyes      . simvastatin (ZOCOR) 40 MG tablet Take 40 mg by mouth every evening.      . traMADol (ULTRAM) 50 MG tablet Take 50 mg by mouth every 6 (six) hours as needed. For pain      . valsartan (DIOVAN) 320 MG tablet Take 320 mg by mouth every evening.        No current facility-administered medications for this visit.     Past Medical History  Diagnosis Date  . DM (diabetes mellitus)   . Nonischemic cardiomyopathy     EF of 15%  . HTN (hypertension)   . Hyperlipidemia    . Sarcoid   . Obesity   . CHF (congestive heart failure)   . Back pain     ROS:   All systems reviewed and negative except as noted in the HPI.   Past Surgical History  Procedure Laterality Date  . Pacemaker insertion       No family history on file.   History   Social History  . Marital Status: Married    Spouse Name: N/A    Number of Children: N/A  . Years of Education: N/A   Occupational History  . Not on file.   Social History Main Topics  . Smoking status: Never Smoker   . Smokeless tobacco: Not on file  . Alcohol Use: Yes     Comment: occasionally  . Drug Use: No  . Sexually Active: Not on file   Other Topics Concern  . Not on file   Social History Narrative  . No narrative on file     BP 152/87  Pulse 72  Ht 5\' 11"  (1.803 m)  Wt 365 lb 6.4 oz (165.744 kg)  BMI 50.99 kg/m2  Physical Exam:  Morbidly obese appearing NAD HEENT:  Unremarkable Neck:  7 cm JVD, no thyromegally Lungs:  Clear except for scattered basilar rales. Decreased breath sounds. HEART:  Regular rate rhythm, no murmurs, no rubs, no clicks, decreased heart sounds. Abd:  soft, obese,positive bowel sounds, no organomegally, no rebound, no guarding Ext:  2 plus pulses, no edema, no cyanosis, no clubbing Skin:  No rashes no nodules Neuro:  CN II through XII intact, motor grossly intact  EKG  DEVICE  Normal device function.  See PaceArt for details.   Assess/Plan:

## 2012-05-14 ENCOUNTER — Other Ambulatory Visit: Payer: Self-pay | Admitting: Gastroenterology

## 2012-06-27 ENCOUNTER — Encounter (HOSPITAL_COMMUNITY): Payer: Self-pay | Admitting: Pharmacy Technician

## 2012-06-28 ENCOUNTER — Encounter (HOSPITAL_COMMUNITY): Payer: Self-pay | Admitting: *Deleted

## 2012-06-28 DIAGNOSIS — G4730 Sleep apnea, unspecified: Secondary | ICD-10-CM

## 2012-06-28 HISTORY — DX: Sleep apnea, unspecified: G47.30

## 2012-07-16 ENCOUNTER — Encounter: Payer: Self-pay | Admitting: Internal Medicine

## 2012-07-16 ENCOUNTER — Ambulatory Visit (INDEPENDENT_AMBULATORY_CARE_PROVIDER_SITE_OTHER): Payer: Medicare Other | Admitting: *Deleted

## 2012-07-16 DIAGNOSIS — I5022 Chronic systolic (congestive) heart failure: Secondary | ICD-10-CM

## 2012-07-16 DIAGNOSIS — Z4502 Encounter for adjustment and management of automatic implantable cardiac defibrillator: Secondary | ICD-10-CM

## 2012-07-16 LAB — REMOTE ICD DEVICE
AL AMPLITUDE: 2.1 mv
BAMS-0001: 160 {beats}/min
BAMS-0003: 70 {beats}/min
DEVICE MODEL ICD: 629155
HV IMPEDENCE: 43 Ohm
RV LEAD AMPLITUDE: 7 mv
TZAT-0012SLOWVT: 200 ms
TZAT-0013SLOWVT: 3
TZST-0001SLOWVT: 2
TZST-0001SLOWVT: 5
TZST-0003SLOWVT: 36 J
VENTRICULAR PACING ICD: 100 pct

## 2012-07-17 ENCOUNTER — Ambulatory Visit (HOSPITAL_COMMUNITY): Payer: Medicare Other | Admitting: Anesthesiology

## 2012-07-17 ENCOUNTER — Encounter (HOSPITAL_COMMUNITY): Payer: Self-pay | Admitting: Anesthesiology

## 2012-07-17 ENCOUNTER — Encounter (HOSPITAL_COMMUNITY)
Admission: RE | Disposition: A | Discharge status: Home / Self Care | Payer: Self-pay | Source: Ambulatory Visit | Attending: Gastroenterology

## 2012-07-17 ENCOUNTER — Encounter (HOSPITAL_COMMUNITY): Payer: Self-pay | Admitting: *Deleted

## 2012-07-17 ENCOUNTER — Ambulatory Visit (HOSPITAL_COMMUNITY)
Admission: RE | Admit: 2012-07-17 | Discharge: 2012-07-17 | Disposition: A | Discharge status: Home / Self Care | Payer: Medicare Other | Source: Ambulatory Visit | Attending: Gastroenterology | Admitting: Gastroenterology

## 2012-07-17 DIAGNOSIS — I459 Conduction disorder, unspecified: Secondary | ICD-10-CM | POA: Insufficient documentation

## 2012-07-17 DIAGNOSIS — Z95810 Presence of automatic (implantable) cardiac defibrillator: Secondary | ICD-10-CM | POA: Insufficient documentation

## 2012-07-17 DIAGNOSIS — G4733 Obstructive sleep apnea (adult) (pediatric): Secondary | ICD-10-CM | POA: Insufficient documentation

## 2012-07-17 DIAGNOSIS — I10 Essential (primary) hypertension: Secondary | ICD-10-CM | POA: Insufficient documentation

## 2012-07-17 DIAGNOSIS — Z1211 Encounter for screening for malignant neoplasm of colon: Secondary | ICD-10-CM | POA: Insufficient documentation

## 2012-07-17 DIAGNOSIS — I428 Other cardiomyopathies: Secondary | ICD-10-CM | POA: Insufficient documentation

## 2012-07-17 DIAGNOSIS — E119 Type 2 diabetes mellitus without complications: Secondary | ICD-10-CM | POA: Insufficient documentation

## 2012-07-17 DIAGNOSIS — E669 Obesity, unspecified: Secondary | ICD-10-CM | POA: Insufficient documentation

## 2012-07-17 DIAGNOSIS — E7800 Pure hypercholesterolemia, unspecified: Secondary | ICD-10-CM | POA: Insufficient documentation

## 2012-07-17 HISTORY — PX: COLONOSCOPY WITH PROPOFOL: SHX5780

## 2012-07-17 HISTORY — DX: Sleep apnea, unspecified: G47.30

## 2012-07-17 HISTORY — DX: Presence of automatic (implantable) cardiac defibrillator: Z95.810

## 2012-07-17 LAB — POCT I-STAT 4, (NA,K, GLUC, HGB,HCT)
Hemoglobin: 13.9 g/dL (ref 13.0–17.0)
Sodium: 143 mEq/L (ref 135–145)

## 2012-07-17 SURGERY — COLONOSCOPY WITH PROPOFOL
Anesthesia: Monitor Anesthesia Care

## 2012-07-17 MED ORDER — PROMETHAZINE HCL 25 MG/ML IJ SOLN: 6.2500 mg | INTRAMUSCULAR | Status: DC | PRN

## 2012-07-17 MED ORDER — KETAMINE HCL 10 MG/ML IJ SOLN
INTRAMUSCULAR | Status: DC | PRN
  Administered 2012-07-17: 20 mg via INTRAVENOUS
  Administered 2012-07-17: 10 mg via INTRAVENOUS

## 2012-07-17 MED ORDER — MIDAZOLAM HCL 5 MG/5ML IJ SOLN
INTRAMUSCULAR | Status: DC | PRN
  Administered 2012-07-17: 2 mg via INTRAVENOUS

## 2012-07-17 MED ORDER — SODIUM CHLORIDE 0.9 % IV SOLN: INTRAVENOUS | Status: DC

## 2012-07-17 MED ORDER — PROPOFOL INFUSION 10 MG/ML OPTIME
INTRAVENOUS | Status: DC | PRN
  Administered 2012-07-17: 180 ug/kg/min via INTRAVENOUS

## 2012-07-17 MED ORDER — LACTATED RINGERS IV SOLN
INTRAVENOUS | Status: DC
  Administered 2012-07-17: 1000 mL via INTRAVENOUS

## 2012-07-17 SURGICAL SUPPLY — 22 items

## 2012-07-17 NOTE — Anesthesia Preprocedure Evaluation (Addendum)
Anesthesia Evaluation  Patient identified by MRN, date of birth, ID band Patient awake    Reviewed: Allergy & Precautions, H&P , NPO status , Patient's Chart, lab work & pertinent test results  Airway Mallampati: III TM Distance: >3 FB Neck ROM: Full    Dental  (+) Teeth Intact and Dental Advisory Given   Pulmonary sleep apnea (Noncompliant with CPAP) ,  breath sounds clear to auscultation  Pulmonary exam normal       Cardiovascular hypertension, Pt. on medications +CHF + Cardiac Defibrillator Rhythm:Regular Rate:Normal  Prior cardiac ablation.   Neuro/Psych negative neurological ROS  negative psych ROS   GI/Hepatic negative GI ROS, Neg liver ROS,   Endo/Other  diabetes, Type 2, Insulin DependentMorbid obesity  Renal/GU negative Renal ROS  negative genitourinary   Musculoskeletal negative musculoskeletal ROS (+)   Abdominal (+) + obese,   Peds  Hematology negative hematology ROS (+)   Anesthesia Other Findings   Reproductive/Obstetrics                          Anesthesia Physical Anesthesia Plan  ASA: III  Anesthesia Plan: MAC   Post-op Pain Management:    Induction: Intravenous  Airway Management Planned: Simple Face Mask  Additional Equipment:   Intra-op Plan:   Post-operative Plan:   Informed Consent: I have reviewed the patients History and Physical, chart, labs and discussed the procedure including the risks, benefits and alternatives for the proposed anesthesia with the patient or authorized representative who has indicated his/her understanding and acceptance.   Dental advisory given  Plan Discussed with: CRNA  Anesthesia Plan Comments:         Anesthesia Quick Evaluation

## 2012-07-17 NOTE — H&P (Signed)
  Procedure: Screening colonoscopy.  History: The patient is a 57 year old male born 1955/08/01. The patient underwent a normal screening colonoscopy on 12/30/2002. The patient is scheduled to undergo a repeat screening colonoscopy.  The patient's BMI is 51. He has obstructive sleep apnea syndrome. He has a non-ischemic cardiomyopathy complicated by heart block. He has a cardiac pacemaker and cardiac defibrillator implanted.  The patient is at high-risk for sedation complications. Propofol will be used for conscious sedation for the procedure today.  Past medical history: Hypertension. Type 2 diabetes mellitus. Obesity. Nonischemic cardiomyopathy. Heart block. Obstructive sleep apnea syndrome. Hypercholesterolemia. Asthma. Glaucoma. Pulmonary sarcoidosis. Supraventricular tachycardia. Anxiety with depression. Bilateral hydrocele. Cervical disc disease. Hemorrhoid surgery  Medication allergies: Penicillin  Habits: The patient has never smoked cigarettes. He does not consume alcohol.  Exam: The patient is alert and lying comfortably on the Endoscopy stretcher. Abdomen is soft and nontender to palpation. Cardiac exam reveals a regular rhythm. Lungs are clear to auscultation.  Recommendations: Proceed with screening colonoscopy.

## 2012-07-17 NOTE — Op Note (Signed)
Procedure: Screening colonoscopy. Normal screening colonoscopy performed 12/30/2002.  Endoscopist: Danise Edge  Premedication: Propofol administered by anesthesia  Procedure: The patient was placed in the left lateral decubitus position. Anal inspection and digital rectal exam were normal. The Pentax pediatric colonoscope was introduced into the rectum and advanced to the cecum. A normal-appearing ileocecal valve and appendiceal orifice were identified. Colonic preparation for the exam today was good.  Rectum. Normal. Retroflex view of the distal rectum normal.  Sigmoid colon and descending colon. Normal.  Splenic flexure. Normal.  Transverse colon. Normal.  Hepatic flexure. Normal.  Ascending colon. Normal.  Cecum and ileocecal valve. Normal.  Assessment: Normal screening proctocolonoscopy to the cecum.  Recommendations: Schedule repeat screening colonoscopy in 10 years.

## 2012-07-17 NOTE — Transfer of Care (Signed)
Immediate Anesthesia Transfer of Care Note  Patient: [name]  Procedure(s) Performed: Procedure(s): COLONOSCOPY WITH PROPOFOL (N/A)  Patient Location: PACU and Endoscopy Unit  Anesthesia Type:MAC  Level of Consciousness: awake and alert   Airway & Oxygen Therapy: Patient Spontanous Breathing and Patient connected to face mask oxygen  Post-op Assessment: Report given to PACU RN and Post -op Vital signs reviewed and stable  Post vital signs: Reviewed and stable  Complications: No apparent anesthesia complications

## 2012-07-17 NOTE — Anesthesia Postprocedure Evaluation (Signed)
Anesthesia Post Note  Patient: [name]  Procedure(s) Performed: Procedure(s) (LRB): COLONOSCOPY WITH PROPOFOL (N/A)  Anesthesia type: MAC  Patient location: PACU  Post pain: Pain level controlled  Post assessment: Post-op Vital signs reviewed  Last Vitals:  Filed Vitals:   07/17/12 1235  BP: 112/87  Pulse:   Temp:   Resp: 18    Post vital signs: Reviewed  Level of consciousness: sedated  Complications: No apparent anesthesia complications

## 2012-07-18 ENCOUNTER — Encounter (HOSPITAL_COMMUNITY): Payer: Self-pay | Admitting: Gastroenterology

## 2012-07-20 ENCOUNTER — Encounter: Payer: Self-pay | Admitting: *Deleted

## 2012-10-22 ENCOUNTER — Ambulatory Visit (INDEPENDENT_AMBULATORY_CARE_PROVIDER_SITE_OTHER): Payer: Medicare Other | Admitting: *Deleted

## 2012-10-22 DIAGNOSIS — Z4502 Encounter for adjustment and management of automatic implantable cardiac defibrillator: Secondary | ICD-10-CM

## 2012-10-22 DIAGNOSIS — I5022 Chronic systolic (congestive) heart failure: Secondary | ICD-10-CM

## 2012-10-24 ENCOUNTER — Encounter: Payer: Self-pay | Admitting: Internal Medicine

## 2012-10-24 LAB — REMOTE ICD DEVICE
AL AMPLITUDE: 2.1 mv
BAMS-0001: 160 {beats}/min
BAMS-0003: 70 {beats}/min
BRDY-0002RV: 60 {beats}/min
DEVICE MODEL ICD: 629155
HV IMPEDENCE: 44 Ohm
RV LEAD AMPLITUDE: 12 mv
TZAT-0004SLOWVT: 8
TZAT-0012SLOWVT: 200 ms
TZAT-0013SLOWVT: 3
TZAT-0019SLOWVT: 7.5 V
TZAT-0020SLOWVT: 1 ms
TZON-0004SLOWVT: 16
TZON-0005SLOWVT: 6
TZST-0001SLOWVT: 3
TZST-0001SLOWVT: 4
TZST-0003SLOWVT: 25 J
TZST-0003SLOWVT: 36 J

## 2012-11-22 ENCOUNTER — Encounter: Payer: Self-pay | Admitting: Cardiology

## 2012-11-22 ENCOUNTER — Ambulatory Visit: Payer: Medicare Other | Admitting: Cardiology

## 2012-11-22 DIAGNOSIS — I442 Atrioventricular block, complete: Secondary | ICD-10-CM | POA: Insufficient documentation

## 2012-11-22 DIAGNOSIS — I10 Essential (primary) hypertension: Secondary | ICD-10-CM | POA: Insufficient documentation

## 2012-11-22 DIAGNOSIS — E785 Hyperlipidemia, unspecified: Secondary | ICD-10-CM | POA: Insufficient documentation

## 2012-11-22 DIAGNOSIS — I471 Supraventricular tachycardia: Secondary | ICD-10-CM | POA: Insufficient documentation

## 2012-11-22 DIAGNOSIS — I428 Other cardiomyopathies: Secondary | ICD-10-CM | POA: Insufficient documentation

## 2012-11-29 ENCOUNTER — Encounter: Payer: Self-pay | Admitting: Cardiology

## 2012-11-29 ENCOUNTER — Ambulatory Visit (INDEPENDENT_AMBULATORY_CARE_PROVIDER_SITE_OTHER): Payer: Medicare Other | Admitting: Cardiology

## 2012-11-29 VITALS — BP 134/90 | HR 78 | Ht 70.0 in | Wt 352.0 lb

## 2012-11-29 DIAGNOSIS — I428 Other cardiomyopathies: Secondary | ICD-10-CM

## 2012-11-29 DIAGNOSIS — I471 Supraventricular tachycardia: Secondary | ICD-10-CM

## 2012-11-29 DIAGNOSIS — I442 Atrioventricular block, complete: Secondary | ICD-10-CM

## 2012-11-29 DIAGNOSIS — I5022 Chronic systolic (congestive) heart failure: Secondary | ICD-10-CM

## 2012-11-29 DIAGNOSIS — I10 Essential (primary) hypertension: Secondary | ICD-10-CM

## 2012-11-29 DIAGNOSIS — Z4502 Encounter for adjustment and management of automatic implantable cardiac defibrillator: Secondary | ICD-10-CM

## 2012-11-29 DIAGNOSIS — I498 Other specified cardiac arrhythmias: Secondary | ICD-10-CM

## 2012-11-29 NOTE — Progress Notes (Addendum)
[address] [place], [place]  [address] Phone: [phone] Fax:  [phone]  Date:  11/29/2012   ID:  [name], DOB December 10, 1955, MRN [id number]  PCP:  Georgann Housekeeper, MD  Cardiologist:  [name], MD     History of Present Illness: [name] is a 57 y.o. male with a history of nonischemic DCM, Sarcoidosis, chronic systolic CHF, HTN, CHB s/p AICD, and SVT s/p ablation.  He is doing well.  He denies any chest pain, SOB, DOE, palpitations, Dizziness or syncope . He has chronic LE edema which he says is very well controlled.    Wt Readings from Last 3 Encounters:  11/29/12 352 lb (159.666 kg)  07/17/12 365 lb (165.563 kg)  07/17/12 365 lb (165.563 kg)     Past Medical History  Diagnosis Date  . DM (diabetes mellitus)   . Nonischemic cardiomyopathy     EF of 15%  . Sarcoid     "lung"-no problems  . Obesity   . CHF (congestive heart failure)   . Back pain   . Automatic implantable cardioverter-defibrillator in situ   . Sleep apnea 06-28-12    no cpap used-unable to tolerate  . HTN (hypertension)   . Complete heart block     s/p PPM 1997 with upgrade to AICD 2011  . Hyperlipidemia   . Asthma   . Glaucoma   . SVT (supraventricular tachycardia)     s/p ablation  . Depression     Current Outpatient Prescriptions  Medication Sig Dispense Refill  . acetaminophen (TYLENOL) 500 MG tablet Take 500 mg by mouth every 6 (six) hours as needed for pain.      Marland Kitchen albuterol (PROVENTIL HFA;VENTOLIN HFA) 108 (90 BASE) MCG/ACT inhaler Inhale 2 puffs into the lungs every 6 (six) hours as needed. For shortness of breath      . aspirin 81 MG tablet Take 81 mg by mouth daily.      . carvedilol (COREG) 25 MG tablet Take 50 mg by mouth 2 (two) times daily with a meal.       . furosemide (LASIX) 20 MG tablet Take 20 mg by mouth every evening.       . insulin glargine (LANTUS) 100 UNIT/ML injection Inject 50 Units into the skin 2 (two) times daily.       .  insulin lispro (HUMALOG) 100 UNIT/ML injection Inject 5-10 Units into the skin 3 (three) times daily before meals. Sliding scale      . Liraglutide (VICTOZA) 18 MG/3ML SOPN Inject 1.2 mg into the skin daily.      . Olopatadine HCl (PATADAY) 0.2 % SOLN Apply 1 drop to eye daily as needed (for runny eyes).       . simvastatin (ZOCOR) 40 MG tablet Take 40 mg by mouth every evening.      . valsartan (DIOVAN) 320 MG tablet Take 320 mg by mouth every evening.       . vitamin B-12 (CYANOCOBALAMIN) 250 MCG tablet Take 250 mcg by mouth daily.      . vitamin C (ASCORBIC ACID) 500 MG tablet Take 500 mg by mouth daily.      Marland Kitchen AFLURIA PRESERVATIVE FREE injection        No current facility-administered medications for this visit.    Allergies:    Allergies  Allergen Reactions  . Penicillins Other (See Comments)    dizziness    Social History:  The patient  reports that he  has never smoked. He does not have any smokeless tobacco history on file. He reports that he drinks alcohol. He reports that he does not use illicit drugs.   Family History:  The patient's family history includes Alzheimer's disease in his mother; CVA in his father; Diabetes in his brother, mother, and sister; Epilepsy in his sister; Heart failure in his father.   ROS:  Please see the history of present illness.      All other systems reviewed and negative.   PHYSICAL EXAM: VS:  BP 134/90  Pulse 78  Ht 5\' 10"  (1.778 m)  Wt 352 lb (159.666 kg)  BMI 50.51 kg/m2 Well nourished, well developed, in no acute distress HEENT: normal Neck: no JVD Cardiac:  normal S1, S2; RRR; no murmur Lungs:  clear to auscultation bilaterally, no wheezing, rhonchi or rales Abd: soft, nontender, no hepatomegaly Ext: no edema Skin: warm and dry Neuro:  CNs 2-12 intact, no focal abnormalities noted     EKG:  NSR with V pacing and PVC's  ASSESSMENT AND PLAN:  1. Nonischemic DCM 2.  HTN - borderline control  - continue Carvedilol/Diovan   -  check BMET 3. Complete heart block s/p PPM and then upgrade to AICD 4. Chronic systolic CHF- appears well compensated on exam  - continue Carvedilol/Lasix/Diovan  Followup with me in 6 months  Signed, [name], MD 11/29/2012 9:58 PM

## 2012-11-29 NOTE — Patient Instructions (Signed)
Your physician recommends that you continue on your current medications as directed. Please refer to the Current Medication list given to you today.  Your physician recommends that you go to the lab today for a BMET  Your physician wants you to follow-up in: 6 Months with Dr Turner You will receive a reminder letter in the mail two months in advance. If you don't receive a letter, please call our office to schedule the follow-up appointment.     

## 2012-11-30 ENCOUNTER — Encounter: Payer: Self-pay | Admitting: General Surgery

## 2012-11-30 LAB — BASIC METABOLIC PANEL
BUN: 9 mg/dL (ref 6–23)
Calcium: 9.4 mg/dL (ref 8.4–10.5)
Chloride: 106 mEq/L (ref 96–112)
Creatinine, Ser: 1 mg/dL (ref 0.4–1.5)
GFR: 103.68 mL/min (ref >60.00)

## 2013-01-24 ENCOUNTER — Encounter: Payer: Self-pay | Admitting: Internal Medicine

## 2013-01-24 ENCOUNTER — Encounter: Payer: Medicare Other | Admitting: *Deleted

## 2013-01-24 DIAGNOSIS — I5022 Chronic systolic (congestive) heart failure: Secondary | ICD-10-CM

## 2013-01-24 DIAGNOSIS — I442 Atrioventricular block, complete: Secondary | ICD-10-CM

## 2013-01-24 DIAGNOSIS — I428 Other cardiomyopathies: Secondary | ICD-10-CM

## 2013-01-24 LAB — MDC_IDC_ENUM_SESS_TYPE_REMOTE
Battery Remaining Longevity: 60 mo
Battery Remaining Percentage: 62 %
Battery Voltage: 2.95 V
Brady Statistic AP VS Percent: 1 %
Brady Statistic RA Percent Paced: 3.3 %
Brady Statistic RV Percent Paced: 95 %
HIGH POWER IMPEDANCE MEASURED VALUE: 41 Ohm
Lead Channel Impedance Value: 410 Ohm
Lead Channel Impedance Value: 410 Ohm
Lead Channel Pacing Threshold Amplitude: 0.5 V
Lead Channel Pacing Threshold Pulse Width: 0.5 ms
Lead Channel Sensing Intrinsic Amplitude: 2.1 mV
Lead Channel Setting Pacing Amplitude: 2 V
Lead Channel Setting Pacing Amplitude: 2 V
Lead Channel Setting Pacing Pulse Width: 0.5 ms
Lead Channel Setting Sensing Sensitivity: 2 mV
MDC IDC MSMT LEADCHNL RV PACING THRESHOLD AMPLITUDE: 0.625 V
MDC IDC MSMT LEADCHNL RV PACING THRESHOLD PULSEWIDTH: 0.5 ms
MDC IDC MSMT LEADCHNL RV SENSING INTR AMPL: 12 mV
MDC IDC PG SERIAL: 629155
MDC IDC SESS DTM: 20150115072136
MDC IDC STAT BRADY AP VP PERCENT: 4.6 %
MDC IDC STAT BRADY AS VP PERCENT: 91 %
MDC IDC STAT BRADY AS VS PERCENT: 2.6 %
Zone Setting Detection Interval: 310 ms
Zone Setting Detection Interval: 350 ms

## 2013-02-05 ENCOUNTER — Encounter: Payer: Self-pay | Admitting: *Deleted

## 2013-04-17 ENCOUNTER — Encounter: Payer: Self-pay | Admitting: Internal Medicine

## 2013-04-17 ENCOUNTER — Ambulatory Visit (INDEPENDENT_AMBULATORY_CARE_PROVIDER_SITE_OTHER): Payer: Medicare Other | Admitting: Internal Medicine

## 2013-04-17 VITALS — BP 113/75 | HR 81 | Ht 70.0 in | Wt 360.8 lb

## 2013-04-17 DIAGNOSIS — N529 Male erectile dysfunction, unspecified: Secondary | ICD-10-CM

## 2013-04-17 DIAGNOSIS — I428 Other cardiomyopathies: Secondary | ICD-10-CM

## 2013-04-17 DIAGNOSIS — I442 Atrioventricular block, complete: Secondary | ICD-10-CM

## 2013-04-17 DIAGNOSIS — I5022 Chronic systolic (congestive) heart failure: Secondary | ICD-10-CM

## 2013-04-17 LAB — MDC_IDC_ENUM_SESS_TYPE_INCLINIC
Battery Remaining Longevity: 57.6 mo
Brady Statistic RV Percent Paced: 96 %
Date Time Interrogation Session: 20150408180730
HighPow Impedance: 44.3045
Implantable Pulse Generator Serial Number: 629155
Lead Channel Impedance Value: 387.5 Ohm
Lead Channel Pacing Threshold Amplitude: 0.5 V
Lead Channel Pacing Threshold Amplitude: 0.5 V
Lead Channel Pacing Threshold Amplitude: 0.625 V
Lead Channel Pacing Threshold Pulse Width: 0.5 ms
Lead Channel Pacing Threshold Pulse Width: 0.5 ms
Lead Channel Pacing Threshold Pulse Width: 0.5 ms
Lead Channel Sensing Intrinsic Amplitude: 1.3 mV
Lead Channel Setting Pacing Amplitude: 2 V
Lead Channel Setting Pacing Amplitude: 2 V
Lead Channel Setting Sensing Sensitivity: 2 mV
MDC IDC MSMT LEADCHNL RA IMPEDANCE VALUE: 425 Ohm
MDC IDC MSMT LEADCHNL RV SENSING INTR AMPL: 12 mV
MDC IDC SET LEADCHNL RV PACING PULSEWIDTH: 0.5 ms
MDC IDC SET ZONE DETECTION INTERVAL: 310 ms
MDC IDC STAT BRADY RA PERCENT PACED: 3 %
Zone Setting Detection Interval: 350 ms

## 2013-04-17 MED ORDER — TADALAFIL 10 MG PO TABS: 10.0000 mg | ORAL_TABLET | Freq: Every day | ORAL | Status: DC | PRN

## 2013-04-17 NOTE — Progress Notes (Signed)
HPI [name] returns today for followup. He is a very pleasant 58 year old man with morbid obesity, a nonischemic cardiomyopathy, complete heart block, status post ICD implantation. When his initial ICD was placed, the patient was unable to receive a left ventricular lead. In the interim, he has been stable. He denies chest pain or shortness of breath. He has minimal peripheral edema. He denies syncope. He is frustrated by his inability to lose and keep off weight. He admits to dietary indiscretion. Finally, the patient complains of ED and is requesting medical therapy.  Allergies  Allergen Reactions  . Penicillins Other (See Comments)    dizziness     Current Outpatient Prescriptions  Medication Sig Dispense Refill  . acetaminophen (TYLENOL) 500 MG tablet Take 500 mg by mouth every 6 (six) hours as needed for pain.      Marland Kitchen AFLURIA PRESERVATIVE FREE injection Inject 0.25 mLs into the muscle daily.       Marland Kitchen albuterol (PROVENTIL HFA;VENTOLIN HFA) 108 (90 BASE) MCG/ACT inhaler Inhale 2 puffs into the lungs every 6 (six) hours as needed. For shortness of breath      . aspirin 81 MG tablet Take 81 mg by mouth daily.      . carvedilol (COREG) 25 MG tablet Take 50 mg by mouth 2 (two) times daily with a meal.       . furosemide (LASIX) 20 MG tablet Take 20 mg by mouth every evening.       . insulin glargine (LANTUS) 100 UNIT/ML injection Inject 50 Units into the skin 2 (two) times daily.       . Liraglutide (VICTOZA) 18 MG/3ML SOPN Inject 1.2 mg into the skin daily.      . Olopatadine HCl (PATADAY) 0.2 % SOLN Apply 1 drop to eye daily as needed (for runny eyes).       . simvastatin (ZOCOR) 40 MG tablet Take 40 mg by mouth every evening.      . valsartan (DIOVAN) 320 MG tablet Take 320 mg by mouth every evening.       . vitamin B-12 (CYANOCOBALAMIN) 250 MCG tablet Take 250 mcg by mouth daily.      . vitamin C (ASCORBIC ACID) 500 MG tablet Take 500 mg by mouth daily.      . tadalafil (CIALIS) 10 MG  tablet Take 1 tablet (10 mg total) by mouth daily as needed for erectile dysfunction.  10 tablet  3   No current facility-administered medications for this visit.     Past Medical History  Diagnosis Date  . DM (diabetes mellitus)   . Nonischemic cardiomyopathy     EF of 15%  . Sarcoid     "lung"-no problems  . Obesity   . CHF (congestive heart failure)   . Back pain   . Automatic implantable cardioverter-defibrillator in situ   . Sleep apnea 06-28-12    no cpap used-unable to tolerate  . HTN (hypertension)   . Complete heart block     s/p PPM 1997 with upgrade to AICD 2011  . Hyperlipidemia   . Asthma   . Glaucoma   . SVT (supraventricular tachycardia)     s/p ablation  . Depression     ROS:   All systems reviewed and negative except as noted in the HPI.   Past Surgical History  Procedure Laterality Date  . Pacemaker insertion    . Tonsillectomy    . Hemorrhoid surgery      early 20's  .  Colonoscopy with propofol N/A 07/17/2012    Procedure: COLONOSCOPY WITH PROPOFOL;  Surgeon: Charolett BumpersMartin K Johnson, MD;  Location: [facility];  Service: Endoscopy;  Laterality: N/A;     Family History  Problem Relation Age of Onset  . Diabetes Mother   . Alzheimer's disease Mother   . CVA Father   . Heart failure Father   . Epilepsy Sister   . Diabetes Brother   . Diabetes Sister      History   Social History  . Marital Status: Married    Spouse Name: N/A    Number of Children: N/A  . Years of Education: N/A   Occupational History  . Not on file.   Social History Main Topics  . Smoking status: Never Smoker   . Smokeless tobacco: Not on file  . Alcohol Use: Yes     Comment: occasionally  . Drug Use: No  . Sexual Activity: Yes   Other Topics Concern  . Not on file   Social History Narrative  . No narrative on file     BP 113/75  Pulse 81  Ht 5\' 10"  (1.778 m)  Wt 360 lb 12.8 oz (163.658 kg)  BMI 51.77 kg/m2  Physical Exam:  Morbidly obese appearing  NAD HEENT: Unremarkable Neck:  7 cm JVD, no thyromegally Lungs:  Clear except for scattered basilar rales. Decreased breath sounds. HEART:  Regular rate rhythm, no murmurs, no rubs, no clicks, decreased heart sounds. Abd:  soft, obese,positive bowel sounds, no organomegally, no rebound, no guarding Ext:  2 plus pulses, no edema, no cyanosis, no clubbing Skin:  No rashes no nodules Neuro:  CN II through XII intact, motor grossly intact   DEVICE  Normal device function.  See PaceArt for details.   Assess/Plan:

## 2013-04-17 NOTE — Assessment & Plan Note (Signed)
His symptoms are currently class 2. He is instructed to maintain a low sodium diet. I have asked the patient to continue his current meds and attempt to lose weight.

## 2013-04-17 NOTE — Assessment & Plan Note (Signed)
He has multiple reasons for this problem. I have asked him to avoid all nitrate continue substances. He will continue his current meds and will be prescribed Cialis.

## 2013-04-17 NOTE — Patient Instructions (Signed)
Remote monitoring is used to monitor your ICD from home. This monitoring reduces the number of office visits required to check your device to one time per year. It allows us to keep an eye on the functioning of your device to ensure it is working properly. You are scheduled for a device check from home on 07-22-2013. You may send your transmission at any time that day. If you have a wireless device, the transmission will be sent automatically. After your physician reviews your transmission, you will receive a postcard with your next transmission date.  Your physician recommends that you schedule a follow-up appointment in: 12 months with Dr.Taylor    

## 2013-04-17 NOTE — Assessment & Plan Note (Signed)
He admits to dietary indiscretion, eating high fat, sweet snacks in excess. I have asked the patient to lose weight and we have set a goal of 20 lbs. He is to avoid sweet snacks and walk every day.

## 2013-06-17 ENCOUNTER — Other Ambulatory Visit: Payer: Self-pay | Admitting: Cardiology

## 2013-06-20 ENCOUNTER — Telehealth: Payer: Self-pay | Admitting: Cardiology

## 2013-06-20 NOTE — Telephone Encounter (Signed)
Lm with leslie at [facility] to re-fax request and to call back if she has questions.

## 2013-06-20 NOTE — Telephone Encounter (Signed)
New message   Fax request sent on 5/20.    Checking to see if the fax request has been address .

## 2013-07-22 ENCOUNTER — Ambulatory Visit (INDEPENDENT_AMBULATORY_CARE_PROVIDER_SITE_OTHER): Payer: Medicare Other | Admitting: *Deleted

## 2013-07-22 DIAGNOSIS — I428 Other cardiomyopathies: Secondary | ICD-10-CM

## 2013-07-22 DIAGNOSIS — I5022 Chronic systolic (congestive) heart failure: Secondary | ICD-10-CM

## 2013-07-22 DIAGNOSIS — I442 Atrioventricular block, complete: Secondary | ICD-10-CM

## 2013-07-23 NOTE — Progress Notes (Signed)
Remote ICD transmission.   

## 2013-07-25 ENCOUNTER — Other Ambulatory Visit: Payer: Self-pay | Admitting: Cardiology

## 2013-07-25 LAB — MDC_IDC_ENUM_SESS_TYPE_REMOTE
Brady Statistic AP VS Percent: 1 %
Brady Statistic AS VP Percent: 97 %
Brady Statistic AS VS Percent: 1 %
Brady Statistic RA Percent Paced: 2.9 %
Brady Statistic RV Percent Paced: 99 %
Date Time Interrogation Session: 20150713062711
HighPow Impedance: 40 Ohm
Implantable Pulse Generator Serial Number: 629155
Lead Channel Impedance Value: 410 Ohm
Lead Channel Pacing Threshold Amplitude: 0.625 V
Lead Channel Pacing Threshold Pulse Width: 0.5 ms
Lead Channel Sensing Intrinsic Amplitude: 2.1 mV
Lead Channel Setting Sensing Sensitivity: 2 mV
MDC IDC MSMT BATTERY REMAINING LONGEVITY: 55 mo
MDC IDC MSMT BATTERY REMAINING PERCENTAGE: 57 %
MDC IDC MSMT BATTERY VOLTAGE: 2.93 V
MDC IDC MSMT LEADCHNL RA PACING THRESHOLD AMPLITUDE: 0.5 V
MDC IDC MSMT LEADCHNL RA PACING THRESHOLD PULSEWIDTH: 0.5 ms
MDC IDC MSMT LEADCHNL RV IMPEDANCE VALUE: 440 Ohm
MDC IDC MSMT LEADCHNL RV SENSING INTR AMPL: 5.4 mV
MDC IDC SET LEADCHNL RA PACING AMPLITUDE: 2 V
MDC IDC SET LEADCHNL RV PACING AMPLITUDE: 2 V
MDC IDC SET LEADCHNL RV PACING PULSEWIDTH: 0.5 ms
MDC IDC SET ZONE DETECTION INTERVAL: 310 ms
MDC IDC STAT BRADY AP VP PERCENT: 3.1 %
Zone Setting Detection Interval: 350 ms

## 2013-07-29 ENCOUNTER — Other Ambulatory Visit: Payer: Self-pay | Admitting: General Surgery

## 2013-07-29 DIAGNOSIS — R002 Palpitations: Secondary | ICD-10-CM

## 2013-07-30 ENCOUNTER — Encounter: Payer: Self-pay | Admitting: *Deleted

## 2013-07-30 ENCOUNTER — Encounter (INDEPENDENT_AMBULATORY_CARE_PROVIDER_SITE_OTHER): Payer: Medicare Other

## 2013-07-30 DIAGNOSIS — I428 Other cardiomyopathies: Secondary | ICD-10-CM

## 2013-07-30 DIAGNOSIS — R002 Palpitations: Secondary | ICD-10-CM

## 2013-07-30 DIAGNOSIS — I442 Atrioventricular block, complete: Secondary | ICD-10-CM

## 2013-07-30 NOTE — Progress Notes (Signed)
Patient ID: [name], male   DOB: November 15, 1955, 58 y.o.   MRN: [id number] [employer] 30 day cardiac event monitor applied to patient.

## 2013-08-16 ENCOUNTER — Encounter: Payer: Self-pay | Admitting: Cardiology

## 2013-08-21 ENCOUNTER — Encounter: Payer: Self-pay | Admitting: Internal Medicine

## 2013-09-03 ENCOUNTER — Telehealth: Payer: Self-pay | Admitting: Cardiology

## 2013-09-03 NOTE — Telephone Encounter (Signed)
Please let patient know that heart monitor showed paced rhythm

## 2013-09-03 NOTE — Telephone Encounter (Signed)
lmtrc

## 2013-09-06 ENCOUNTER — Encounter: Payer: Self-pay | Admitting: General Surgery

## 2013-09-06 NOTE — Telephone Encounter (Signed)
Released to pts mychart

## 2013-09-18 ENCOUNTER — Other Ambulatory Visit: Payer: Self-pay | Admitting: Internal Medicine

## 2013-10-23 ENCOUNTER — Encounter: Payer: Self-pay | Admitting: Internal Medicine

## 2013-10-23 ENCOUNTER — Ambulatory Visit (INDEPENDENT_AMBULATORY_CARE_PROVIDER_SITE_OTHER): Payer: Medicare Other | Admitting: *Deleted

## 2013-10-23 DIAGNOSIS — I429 Cardiomyopathy, unspecified: Secondary | ICD-10-CM

## 2013-10-23 DIAGNOSIS — I5022 Chronic systolic (congestive) heart failure: Secondary | ICD-10-CM

## 2013-10-23 DIAGNOSIS — I428 Other cardiomyopathies: Secondary | ICD-10-CM

## 2013-10-23 LAB — MDC_IDC_ENUM_SESS_TYPE_REMOTE
Battery Voltage: 2.93 V
Brady Statistic AP VP Percent: 3.7 %
Brady Statistic AP VS Percent: 1 %
Brady Statistic AS VP Percent: 96 %
Brady Statistic AS VS Percent: 1 %
Brady Statistic RA Percent Paced: 3.4 %
Brady Statistic RV Percent Paced: 99 %
Date Time Interrogation Session: 20151014070147
HighPow Impedance: 43 Ohm
Implantable Pulse Generator Serial Number: 629155
Lead Channel Impedance Value: 390 Ohm
Lead Channel Pacing Threshold Pulse Width: 0.5 ms
Lead Channel Sensing Intrinsic Amplitude: 1.1 mV
Lead Channel Sensing Intrinsic Amplitude: 12 mV
Lead Channel Setting Pacing Amplitude: 2 V
MDC IDC MSMT BATTERY REMAINING LONGEVITY: 52 mo
MDC IDC MSMT BATTERY REMAINING PERCENTAGE: 54 %
MDC IDC MSMT LEADCHNL RA IMPEDANCE VALUE: 410 Ohm
MDC IDC MSMT LEADCHNL RA PACING THRESHOLD AMPLITUDE: 0.5 V
MDC IDC MSMT LEADCHNL RA PACING THRESHOLD PULSEWIDTH: 0.5 ms
MDC IDC MSMT LEADCHNL RV PACING THRESHOLD AMPLITUDE: 0.625 V
MDC IDC SET LEADCHNL RA PACING AMPLITUDE: 2 V
MDC IDC SET LEADCHNL RV PACING PULSEWIDTH: 0.5 ms
MDC IDC SET LEADCHNL RV SENSING SENSITIVITY: 2 mV
MDC IDC SET ZONE DETECTION INTERVAL: 310 ms
Zone Setting Detection Interval: 350 ms

## 2013-10-23 NOTE — Progress Notes (Signed)
Remote ICD transmission.   

## 2013-10-25 ENCOUNTER — Other Ambulatory Visit: Payer: Self-pay

## 2013-10-25 MED ORDER — FUROSEMIDE 20 MG PO TABS: ORAL_TABLET | ORAL | Status: DC

## 2013-10-28 ENCOUNTER — Encounter: Payer: Self-pay | Admitting: *Deleted

## 2013-10-28 ENCOUNTER — Encounter: Payer: Medicare Other | Attending: Internal Medicine | Admitting: *Deleted

## 2013-10-28 DIAGNOSIS — Z713 Dietary counseling and surveillance: Secondary | ICD-10-CM | POA: Diagnosis not present

## 2013-10-28 DIAGNOSIS — E119 Type 2 diabetes mellitus without complications: Secondary | ICD-10-CM

## 2013-10-28 DIAGNOSIS — Z794 Long term (current) use of insulin: Secondary | ICD-10-CM | POA: Diagnosis not present

## 2013-10-28 NOTE — Patient Instructions (Signed)
Goals:  Follow Diabetes Meal Plan as instructed  Eat 3 meals and 2 snacks, every 3-5 hrs  Limit carbohydrate intake to 60 grams carbohydrate/meal  Limit carbohydrate intake to 30 grams carbohydrate/snack  Add lean protein foods to meals/snacks  Monitor glucose levels as instructed by your doctor  Aim for 30 mins of physical activity daily  Bring food record and glucose log to your next nutrition visit  Take insulin at 11:30 am and 11:30 pm

## 2013-10-28 NOTE — Progress Notes (Signed)
Diabetes Self-Management Education  Visit Type:    Appt. Start Time: 1400 Appt. End Time: 1500  10/28/2013  [name], identified by name and date of birth, is a 58 y.o. male with a diagnosis of Diabetes:  .  Other people present during visit:  Patient   ASSESSMENT   Initial Visit Information:  Are you currently following a meal plan?: No   Are you taking your medications as prescribed?: Yes Are you checking your feet?: No   How often do you need to have someone help you when you read instructions, pamphlets, or other written materials from your doctor or pharmacy?: 1 - Never What is the last grade level you completed in school?: high school diploma  Psychosocial:     Patient Belief/Attitude about Diabetes: Other (comment) Self-management support: None Other persons present: Patient Patient Concerns: Nutrition/Meal planning;Medication;Monitoring;Healthy Lifestyle;Weight Control;Support Special Needs: None Preferred Learning Style: Visual;Hands on Learning Readiness: Ready  Complications:   How often do you check your blood sugar?: 3-4 times/day Fasting Blood glucose range (mg/dL): 53-664 Postprandial Blood glucose range (mg/dL): 403-474 Number of hypoglycemic episodes per month: 6 Number of hyperglycemic episodes per week: 1 Have you had a dental exam in the past 12 months?: No  Diet Intake:  Breakfast: banana and ritz crackers; oatmeal; biscuit and coffee Snack (morning): not usually Lunch: chicken salad or sandwich with carrots; burger Snack (afternoon): trail mix or carrots or peanut butter Dinner: chicken or biscuit or peanuts or frozen meal Snack (evening): cookies sometimes Beverage(s): water, tea sometimes or coffee; sometimes soda; sometimes lemonade  Exercise:  Exercise: Light (walking / raking leaves)  Individualized Plan for Diabetes Self-Management Training:   Learning Objective:  Patient will have a greater understanding of diabetes  self-management.  Patient education plan per assessed needs and concerns is to attend individual sessions for     Education Topics Reviewed with Patient Today:  Definition of diabetes, type 1 and 2, and the diagnosis of diabetes;Factors that contribute to the development of diabetes Role of diet in the treatment of diabetes and the relationship between the three main macronutrients and blood glucose level;Food label reading, portion sizes and measuring food.;Carbohydrate counting;Meal timing in regards to the patients' current diabetes medication.;Effects of alcohol on blood glucose and safety factors with consumption of alcohol. Role of exercise on diabetes management, blood pressure control and cardiac health.;Identified with patient nutritional and/or medication changes necessary with exercise. Reviewed patients medication for diabetes, action, purpose, timing of dose and side effects. Purpose and frequency of SMBG. Taught treatment of hypoglycemia - the 15 rule.;Discussed and identified patients' treatment of hyperglycemia. Relationship between chronic complications and blood glucose control        PATIENTS GOALS/Plan (Developed by the patient):  Nutrition: Follow meal plan discussed Physical Activity: Exercise 3-5 times per week;30 minutes per day Medications: take my medication as prescribed Monitoring : test my blood glucose as discussed (note x per day with comment);test blood glucose pre and post meals as discussed  Plan:   Patient Instructions  Goals:  Follow Diabetes Meal Plan as instructed  Eat 3 meals and 2 snacks, every 3-5 hrs  Limit carbohydrate intake to 60 grams carbohydrate/meal  Limit carbohydrate intake to 30 grams carbohydrate/snack  Add lean protein foods to meals/snacks  Monitor glucose levels as instructed by your doctor  Aim for 30 mins of physical activity daily  Bring food record and glucose log to your next nutrition visit  Take insulin at  11:30 am and 11:30  pm   Expected Outcomes:  Demonstrated interest in learning. Expect positive outcomes  Education material provided: Living Well with Diabetes, Meal plan card and Support group flyer  If problems or questions, patient to contact team via:  Phone  Future DSME appointment: 3-4 months

## 2013-11-01 ENCOUNTER — Encounter: Payer: Self-pay | Admitting: *Deleted

## 2013-11-21 ENCOUNTER — Ambulatory Visit (INDEPENDENT_AMBULATORY_CARE_PROVIDER_SITE_OTHER): Payer: Medicare Other | Admitting: Cardiology

## 2013-11-21 ENCOUNTER — Encounter: Payer: Self-pay | Admitting: Cardiology

## 2013-11-21 VITALS — BP 126/92 | HR 141 | Ht 70.0 in | Wt 368.6 lb

## 2013-11-21 DIAGNOSIS — I442 Atrioventricular block, complete: Secondary | ICD-10-CM

## 2013-11-21 DIAGNOSIS — I429 Cardiomyopathy, unspecified: Secondary | ICD-10-CM

## 2013-11-21 DIAGNOSIS — I428 Other cardiomyopathies: Secondary | ICD-10-CM

## 2013-11-21 DIAGNOSIS — I77810 Thoracic aortic ectasia: Secondary | ICD-10-CM

## 2013-11-21 DIAGNOSIS — I5022 Chronic systolic (congestive) heart failure: Secondary | ICD-10-CM

## 2013-11-21 DIAGNOSIS — I10 Essential (primary) hypertension: Secondary | ICD-10-CM

## 2013-11-21 LAB — BASIC METABOLIC PANEL
BUN: 7 mg/dL (ref 6–23)
CALCIUM: 9.2 mg/dL (ref 8.4–10.5)
CO2: 22 mEq/L (ref 19–32)
CREATININE: 1 mg/dL (ref 0.4–1.5)
Chloride: 110 mEq/L (ref 96–112)
GFR: 103.33 mL/min (ref >60.00)
Glucose, Bld: 174 mg/dL — ABNORMAL HIGH (ref 70–99)
POTASSIUM: 4.1 meq/L (ref 3.5–5.1)
Sodium: 140 mEq/L (ref 135–145)

## 2013-11-21 NOTE — Patient Instructions (Addendum)
Your physician has requested that you have an echocardiogram. Echocardiography is a painless test that uses sound waves to create images of your heart. It provides your doctor with information about the size and shape of your heart and how well your heart's chambers and valves are working. This procedure takes approximately one hour. There are no restrictions for this procedure.  Your physician recommends that you have lab work TODAY (BMET).  You are schedule for a blood pressure check in two weeks. (November 24)  Your physician wants you to follow-up in: 6 months with Dr. [name]. You will receive a reminder letter in the mail two months in advance. If you don't receive a letter, please call our office to schedule the follow-up appointment.  Low-Sodium Eating Plan Sodium raises blood pressure and causes water to be held in the body. Getting less sodium from food will help lower your blood pressure, reduce any swelling, and protect your heart, liver, and kidneys. We get sodium by adding salt (sodium chloride) to food. Most of our sodium comes from canned, boxed, and frozen foods. Restaurant foods, fast foods, and pizza are also very high in sodium. Even if you take medicine to lower your blood pressure or to reduce fluid in your body, getting less sodium from your food is important. WHAT IS MY PLAN? Most people should limit their sodium intake to 2,300 mg a day. Your health care provider recommends that you limit your sodium intake to ___2 GRAMS_______ a day.  WHAT DO I NEED TO KNOW ABOUT THIS EATING PLAN? For the low-sodium eating plan, you will follow these general guidelines:  Choose foods with a % Daily Value for sodium of less than 5% (as listed on the food label).   Use salt-free seasonings or herbs instead of table salt or sea salt.   Check with your health care provider or pharmacist before using salt substitutes.   Eat fresh foods.  Eat more vegetables and fruits.  Limit canned  vegetables. If you do use them, rinse them well to decrease the sodium.   Limit cheese to 1 oz (28 g) per day.   Eat lower-sodium products, often labeled as "lower sodium" or "no salt added."  Avoid foods that contain monosodium glutamate (MSG). MSG is sometimes added to [place] food and some canned foods.  Check food labels (Nutrition Facts labels) on foods to learn how much sodium is in one serving.  Eat more home-cooked food and less restaurant, buffet, and fast food.  When eating at a restaurant, ask that your food be prepared with less salt or none, if possible.  HOW DO I READ FOOD LABELS FOR SODIUM INFORMATION? The Nutrition Facts label lists the amount of sodium in one serving of the food. If you eat more than one serving, you must multiply the listed amount of sodium by the number of servings. Food labels may also identify foods as:  Sodium free--Less than 5 mg in a serving.  Very low sodium--35 mg or less in a serving.  Low sodium--140 mg or less in a serving.  Light in sodium--50% less sodium in a serving. For example, if a food that usually has 300 mg of sodium is changed to become light in sodium, it will have 150 mg of sodium.  Reduced sodium--25% less sodium in a serving. For example, if a food that usually has 400 mg of sodium is changed to reduced sodium, it will have 300 mg of sodium. WHAT FOODS CAN I EAT? Grains Low-sodium  cereals, including oats, puffed wheat and rice, and shredded wheat cereals. Low-sodium crackers. Unsalted rice and pasta. Lower-sodium bread.  Vegetables Frozen or fresh vegetables. Low-sodium or reduced-sodium canned vegetables. Low-sodium or reduced-sodium tomato sauce and paste. Low-sodium or reduced-sodium tomato and vegetable juices.  Fruits Fresh, frozen, and canned fruit. Fruit juice.  Meat and Other Protein Products Low-sodium canned tuna and salmon. Fresh or frozen meat, poultry, seafood, and fish. Lamb. Unsalted nuts. Dried  beans, peas, and lentils without added salt. Unsalted canned beans. Homemade soups without salt. Eggs.  Dairy Milk. Soy milk. Ricotta cheese. Low-sodium or reduced-sodium cheeses. Yogurt.  Condiments Fresh and dried herbs and spices. Salt-free seasonings. Onion and garlic powders. Low-sodium varieties of mustard and ketchup. Lemon juice.  Fats and Oils Reduced-sodium salad dressings. Unsalted butter.  Other Unsalted popcorn and pretzels.  The items listed above may not be a complete list of recommended foods or beverages. Contact your dietitian for more options. WHAT FOODS ARE NOT RECOMMENDED? Grains Instant hot cereals. Bread stuffing, pancake, and biscuit mixes. Croutons. Seasoned rice or pasta mixes. Noodle soup cups. Boxed or frozen macaroni and cheese. Self-rising flour. Regular salted crackers. Vegetables Regular canned vegetables. Regular canned tomato sauce and paste. Regular tomato and vegetable juices. Frozen vegetables in sauces. Salted french fries. Olives. Rosita FirePickles. Relishes. Sauerkraut. Salsa. Meat and Other Protein Products Salted, canned, smoked, spiced, or pickled meats, seafood, or fish. Bacon, ham, sausage, hot dogs, corned beef, chipped beef, and packaged luncheon meats. Salt pork. Jerky. Pickled herring. Anchovies, regular canned tuna, and sardines. Salted nuts. Dairy Processed cheese and cheese spreads. Cheese curds. Blue cheese and cottage cheese. Buttermilk.  Condiments Onion and garlic salt, seasoned salt, table salt, and sea salt. Canned and packaged gravies. Worcestershire sauce. Tartar sauce. Barbecue sauce. Teriyaki sauce. Soy sauce, including reduced sodium. Steak sauce. Fish sauce. Oyster sauce. Cocktail sauce. Horseradish. Regular ketchup and mustard. Meat flavorings and tenderizers. Bouillon cubes. Hot sauce. Tabasco sauce. Marinades. Taco seasonings. Relishes. Fats and Oils Regular salad dressings. Salted butter. Margarine. Ghee. Bacon fat.   Other Potato and tortilla chips. Corn chips and puffs. Salted popcorn and pretzels. Canned or dried soups. Pizza. Frozen entrees and pot pies.  The items listed above may not be a complete list of foods and beverages to avoid. Contact your dietitian for more information. Document Released: 06/18/2001 Document Revised: 01/01/2013 Document Reviewed: 10/31/2012 [facility] Patient Information 2015 [employer] [place]. This information is not intended to replace advice given to you by your health care provider. Make sure you discuss any questions you have with your health care provider.

## 2013-11-21 NOTE — Progress Notes (Signed)
[address] [place], [place]  [address] Phone: [phone] Fax:  [phone]  Date:  11/21/2013   ID:  [name], DOB 03-19-1955, MRN [id number]  PCP:  Georgann Housekeeper, MD  Cardiologist:  [name], MD    History of Present Illness: [name] is a 58 y.o. male with a history of nonischemic DCM, Sarcoidosis, chronic systolic CHF, HTN, CHB s/p AICD, and SVT s/p ablation. He is doing well. He denies any chest pain, palpitations, Dizziness or syncope . He has chronic LE edema which he says is very well controlled on diuretics.  He has gained 8 pounds since he saw Dr. Thereasa Distance Readings from Last 3 Encounters:  11/21/13 368 lb 9.6 oz (167.196 kg)  04/17/13 360 lb 12.8 oz (163.658 kg)  11/29/12 352 lb (159.666 kg)     Past Medical History  Diagnosis Date  . DM (diabetes mellitus)   . Nonischemic cardiomyopathy     EF of 15%  . Sarcoid     "lung"-no problems  . Obesity   . CHF (congestive heart failure)   . Back pain   . Automatic implantable cardioverter-defibrillator in situ   . Sleep apnea 06-28-12    no cpap used-unable to tolerate  . HTN (hypertension)   . Complete heart block     s/p PPM 1997 with upgrade to AICD 2011  . Hyperlipidemia   . Asthma   . Glaucoma   . SVT (supraventricular tachycardia)     s/p ablation  . Depression     Current Outpatient Prescriptions  Medication Sig Dispense Refill  . acetaminophen (TYLENOL) 500 MG tablet Take 500 mg by mouth every 6 (six) hours as needed for pain.    Marland Kitchen AFLURIA PRESERVATIVE FREE injection Inject 0.25 mLs into the muscle daily.     Marland Kitchen albuterol (PROVENTIL HFA;VENTOLIN HFA) 108 (90 BASE) MCG/ACT inhaler Inhale 2 puffs into the lungs every 6 (six) hours as needed. For shortness of breath    . aspirin 81 MG tablet Take 81 mg by mouth daily.    . carvedilol (COREG) 25 MG tablet take 1 tablet by mouth twice a day with food 180 tablet 3  . DIOVAN 320 MG tablet take 1 tablet by mouth once  daily 90 tablet 0  . furosemide (LASIX) 20 MG tablet take 1 tablet by mouth once daily 30 tablet 0  . insulin glargine (LANTUS) 100 UNIT/ML injection Inject 50 Units into the skin 2 (two) times daily.     . Liraglutide (VICTOZA) 18 MG/3ML SOPN Inject 1.2 mg into the skin daily.    . Olopatadine HCl (PATADAY) 0.2 % SOLN Apply 1 drop to eye daily as needed (for runny eyes).     . simvastatin (ZOCOR) 40 MG tablet Take 40 mg by mouth every evening.    . tadalafil (CIALIS) 10 MG tablet Take 1 tablet (10 mg total) by mouth daily as needed for erectile dysfunction. 10 tablet 3  . vitamin B-12 (CYANOCOBALAMIN) 250 MCG tablet Take 250 mcg by mouth daily.    . vitamin C (ASCORBIC ACID) 500 MG tablet Take 500 mg by mouth daily.     No current facility-administered medications for this visit.    Allergies:    Allergies  Allergen Reactions  . Penicillins Other (See Comments)    dizziness    Social History:  The patient  reports that he has never smoked. He does not have any smokeless tobacco history  on file. He reports that he drinks alcohol. He reports that he does not use illicit drugs.   Family History:  The patient's family history includes Alzheimer's disease in his mother; CVA in his father; Diabetes in his brother, mother, and sister; Epilepsy in his sister; Heart failure in his father.   ROS:  Please see the history of present illness.      All other systems reviewed and negative.   PHYSICAL EXAM: VS:  BP 126/92 mmHg  Pulse 141  Ht 5\' 10"  (1.778 m)  Wt 368 lb 9.6 oz (167.196 kg)  BMI 52.89 kg/m2 Well nourished, well developed, in no acute distress HEENT: normal Neck: no JVD Cardiac:  normal S1, S2; RRR; no murmur Lungs:  clear to auscultation bilaterally, no wheezing, rhonchi or rales Abd: soft, nontender, no hepatomegaly Ext: no edema Skin: warm and dry Neuro:  CNs 2-12 intact, no focal abnormalities noted  EKG:  Sinus tachcyardia with V paced rhythm   ASSESSMENT AND  PLAN:  1. Nonischemic DCM EF 35-40% by echo 2013 2. HTN - borderline control - continue Carvedilol/Diovan - check BMET             - I have instructed him to try to follow a low sodium diet. He has been eating a lot of packaged food and ritz crackers.  I will have him come back in 2 weeks for BP check with the nurse 3. Complete heart block s/p PPM and then upgrade to AICD.  He was tachycardic on EKG but after pacer interrogation his HR was 70bpm with HR>100bpm on histogram on ly 5% of the time.   4. Chronic systolic CHF- appears well compensated on exam - continue Carvedilol/Lasix/Diovan   5.  Mildly dilated aortic root - repeat 2D echo since it has been 3 years  Followup with me in 6 months   Signed, Armanda Magicraci Zorah Backes, MD [facility] 11/21/2013 2:46 PM

## 2013-11-23 ENCOUNTER — Telehealth: Payer: Self-pay | Admitting: Cardiology

## 2013-11-25 ENCOUNTER — Other Ambulatory Visit: Payer: Self-pay

## 2013-11-25 MED ORDER — FUROSEMIDE 20 MG PO TABS: ORAL_TABLET | ORAL | Status: DC

## 2013-12-04 ENCOUNTER — Ambulatory Visit (HOSPITAL_COMMUNITY): Payer: Medicare Other | Attending: Cardiology | Admitting: Radiology

## 2013-12-04 ENCOUNTER — Ambulatory Visit (INDEPENDENT_AMBULATORY_CARE_PROVIDER_SITE_OTHER): Payer: Medicare Other

## 2013-12-04 VITALS — BP 132/82 | HR 76 | Resp 18 | Ht 70.0 in | Wt 365.4 lb

## 2013-12-04 DIAGNOSIS — E785 Hyperlipidemia, unspecified: Secondary | ICD-10-CM | POA: Diagnosis not present

## 2013-12-04 DIAGNOSIS — E119 Type 2 diabetes mellitus without complications: Secondary | ICD-10-CM | POA: Insufficient documentation

## 2013-12-04 DIAGNOSIS — I77810 Thoracic aortic ectasia: Secondary | ICD-10-CM

## 2013-12-04 DIAGNOSIS — I10 Essential (primary) hypertension: Secondary | ICD-10-CM | POA: Insufficient documentation

## 2013-12-04 DIAGNOSIS — I5022 Chronic systolic (congestive) heart failure: Secondary | ICD-10-CM | POA: Insufficient documentation

## 2013-12-04 NOTE — Progress Notes (Signed)
Echocardiogram performed.  

## 2013-12-04 NOTE — Patient Instructions (Signed)
Your physician recommends that you continue on your current medications as directed. Please refer to the Current Medication list given to you today.     

## 2013-12-04 NOTE — Progress Notes (Signed)
Patient in for nurse visit/ BP check. Patient has no complaints.  BP 132/82, HR 76, 96% RA.  Medications reviewed and patient states he is compliant.   To Dr. [name] and Dr. Ladona Ridgel (DOD) for review.

## 2013-12-11 ENCOUNTER — Telehealth: Payer: Self-pay

## 2013-12-11 DIAGNOSIS — I77810 Thoracic aortic ectasia: Secondary | ICD-10-CM

## 2013-12-11 NOTE — Telephone Encounter (Signed)
Patient informed of ECHO results and verbal understanding expressed.  Repeat ECHO ordered for scheduling in 11/2014.

## 2013-12-11 NOTE — Telephone Encounter (Signed)
New message ° ° °Patient returning call back to nurse.  °

## 2013-12-11 NOTE — Telephone Encounter (Signed)
-----   Message from Quintella Reichert, MD sent at 12/09/2013 10:55 AM EST ----- Please let patient know that echo showed moderately enlarged LV with severe LV dysfunction and mildly enlarged RV. The aortic root was borderline dilated at 12mm.  Repeat echo in 1 year

## 2013-12-11 NOTE — Telephone Encounter (Signed)
Patient informed of ECHO results and verbal understanding expressed.  Repeat ECHO ordered for scheduling.

## 2013-12-24 ENCOUNTER — Other Ambulatory Visit: Payer: Self-pay | Admitting: Internal Medicine

## 2014-01-27 ENCOUNTER — Other Ambulatory Visit: Payer: Self-pay | Admitting: Internal Medicine

## 2014-01-27 ENCOUNTER — Encounter: Payer: Self-pay | Admitting: Internal Medicine

## 2014-01-27 ENCOUNTER — Ambulatory Visit (INDEPENDENT_AMBULATORY_CARE_PROVIDER_SITE_OTHER): Payer: Medicare Other | Admitting: *Deleted

## 2014-01-27 ENCOUNTER — Encounter: Payer: Medicare Other | Attending: Internal Medicine | Admitting: *Deleted

## 2014-01-27 VITALS — Wt 375.0 lb

## 2014-01-27 DIAGNOSIS — Z794 Long term (current) use of insulin: Secondary | ICD-10-CM | POA: Diagnosis not present

## 2014-01-27 DIAGNOSIS — I5022 Chronic systolic (congestive) heart failure: Secondary | ICD-10-CM

## 2014-01-27 DIAGNOSIS — I429 Cardiomyopathy, unspecified: Secondary | ICD-10-CM

## 2014-01-27 DIAGNOSIS — E119 Type 2 diabetes mellitus without complications: Secondary | ICD-10-CM | POA: Diagnosis present

## 2014-01-27 DIAGNOSIS — Z713 Dietary counseling and surveillance: Secondary | ICD-10-CM | POA: Diagnosis not present

## 2014-01-27 DIAGNOSIS — I428 Other cardiomyopathies: Secondary | ICD-10-CM

## 2014-01-27 LAB — MDC_IDC_ENUM_SESS_TYPE_REMOTE
Battery Remaining Longevity: 48 mo
Battery Voltage: 2.93 V
Brady Statistic AP VP Percent: 4.1 %
Brady Statistic AP VS Percent: 1 %
Brady Statistic AS VP Percent: 95 %
Date Time Interrogation Session: 20160118073239
HIGH POWER IMPEDANCE MEASURED VALUE: 38 Ohm
Implantable Pulse Generator Serial Number: 629155
Lead Channel Impedance Value: 360 Ohm
Lead Channel Impedance Value: 380 Ohm
Lead Channel Pacing Threshold Pulse Width: 0.5 ms
Lead Channel Setting Sensing Sensitivity: 2 mV
MDC IDC MSMT BATTERY REMAINING PERCENTAGE: 51 %
MDC IDC MSMT LEADCHNL RA PACING THRESHOLD AMPLITUDE: 0.5 V
MDC IDC MSMT LEADCHNL RA SENSING INTR AMPL: 1.5 mV
MDC IDC MSMT LEADCHNL RV PACING THRESHOLD AMPLITUDE: 0.625 V
MDC IDC MSMT LEADCHNL RV PACING THRESHOLD PULSEWIDTH: 0.5 ms
MDC IDC MSMT LEADCHNL RV SENSING INTR AMPL: 12 mV
MDC IDC SET LEADCHNL RA PACING AMPLITUDE: 2 V
MDC IDC SET LEADCHNL RV PACING AMPLITUDE: 2 V
MDC IDC SET LEADCHNL RV PACING PULSEWIDTH: 0.5 ms
MDC IDC STAT BRADY AS VS PERCENT: 1 %
MDC IDC STAT BRADY RA PERCENT PACED: 3.6 %
MDC IDC STAT BRADY RV PERCENT PACED: 99 %
Zone Setting Detection Interval: 310 ms
Zone Setting Detection Interval: 350 ms

## 2014-01-27 NOTE — Patient Instructions (Addendum)
Check sugar  Before bed.  Goal is 100-140.  If you're less than 100, eat a snack before bed Check 2 hours after lunch and indicate on log that it's after lunch Don't worry about scale;  Pay attention to how clothes fit and how you feel better from exercising Consider portion plate for more vegetables and less starch

## 2014-01-27 NOTE — Progress Notes (Signed)
Remote ICD transmission.   

## 2014-01-27 NOTE — Progress Notes (Signed)
Appointment start time: 1400  Appointment end time: 1430  Assessment:  [name] is here for follow up diabetes self management education.  patient reports he is drinking more water, he also eats less, but states he is still eating cookies.  He think he is exercising more, but feels like he is getting heavier.  He states his glucose levels are stable.  He started off trying to portion things out, but wasn't able to sustain the smaller portions  Physical activty: elliptical 30 minutes total, stationary bike 10 minutes, weight training also.  3-4 days/week most weeks.  His pants fit looser, but his weight is increasing.  He does have issues with fluid retention and he is taking a diuretic for this. He does have 148 lb of fluid  TANITA  BODY COMP RESULTS  01/27/14   BMI (kg/m^2) 53.8   Fat Mass (lbs) 172.5   Fat Free Mass (lbs) 202.5   Total Body Water (lbs) 148  %body fat 46      Nutrition diagnosis:  Unintentional weight gain as related to increased activity level and fluid retention.  As evidenced by 10 pound weight gain in 2 months.   Intervention:  Nutrition counseling provided.  Discussed effects of increased muscle mass on weight.   Discussed glucose goals for before and after meals.  Discussed strategies for treating hypoglycemia.   Goals: Check sugar  Before bed.  Goal is 100-140.  If you're less than 100, eat a snack before bed Check 2 hours after lunch and indicate on log that it's after lunch Don't worry about scale;  Pay attention to how clothes fit and how you feel better from exercising Consider portion plate for more vegetables and less starch Take 3-4 glucose tablets if glucose is less than 70 mg/dl   Monitoring:  Patient will follow up in 3 months

## 2014-02-11 ENCOUNTER — Encounter: Payer: Self-pay | Admitting: Cardiology

## 2014-02-25 ENCOUNTER — Encounter: Payer: Self-pay | Admitting: Cardiology

## 2014-03-25 ENCOUNTER — Other Ambulatory Visit: Payer: Self-pay | Admitting: Internal Medicine

## 2014-04-22 ENCOUNTER — Encounter: Payer: Self-pay | Admitting: Internal Medicine

## 2014-04-22 ENCOUNTER — Ambulatory Visit (INDEPENDENT_AMBULATORY_CARE_PROVIDER_SITE_OTHER): Payer: Medicare Other | Admitting: Internal Medicine

## 2014-04-22 VITALS — BP 128/84 | HR 103 | Ht 70.0 in | Wt 375.4 lb

## 2014-04-22 DIAGNOSIS — I10 Essential (primary) hypertension: Secondary | ICD-10-CM

## 2014-04-22 DIAGNOSIS — I442 Atrioventricular block, complete: Secondary | ICD-10-CM

## 2014-04-22 DIAGNOSIS — I429 Cardiomyopathy, unspecified: Secondary | ICD-10-CM

## 2014-04-22 DIAGNOSIS — I471 Supraventricular tachycardia: Secondary | ICD-10-CM

## 2014-04-22 DIAGNOSIS — I428 Other cardiomyopathies: Secondary | ICD-10-CM

## 2014-04-22 DIAGNOSIS — I5022 Chronic systolic (congestive) heart failure: Secondary | ICD-10-CM | POA: Diagnosis not present

## 2014-04-22 DIAGNOSIS — Z95810 Presence of automatic (implantable) cardiac defibrillator: Secondary | ICD-10-CM

## 2014-04-22 LAB — MDC_IDC_ENUM_SESS_TYPE_INCLINIC
Battery Remaining Longevity: 45.6 mo
Brady Statistic RV Percent Paced: 99 %
Date Time Interrogation Session: 20160412125257
HighPow Impedance: 39.4629
Implantable Pulse Generator Serial Number: 629155
Lead Channel Impedance Value: 362.5 Ohm
Lead Channel Impedance Value: 375 Ohm
Lead Channel Pacing Threshold Pulse Width: 0.5 ms
Lead Channel Sensing Intrinsic Amplitude: 1.5 mV
Lead Channel Sensing Intrinsic Amplitude: 12 mV
Lead Channel Setting Pacing Amplitude: 2 V
Lead Channel Setting Pacing Amplitude: 2 V
Lead Channel Setting Sensing Sensitivity: 2 mV
MDC IDC MSMT LEADCHNL RA PACING THRESHOLD AMPLITUDE: 0.75 V
MDC IDC MSMT LEADCHNL RA PACING THRESHOLD PULSEWIDTH: 0.5 ms
MDC IDC MSMT LEADCHNL RV PACING THRESHOLD AMPLITUDE: 0.625 V
MDC IDC SET LEADCHNL RV PACING PULSEWIDTH: 0.5 ms
MDC IDC SET ZONE DETECTION INTERVAL: 350 ms
MDC IDC STAT BRADY RA PERCENT PACED: 3.3 %
Zone Setting Detection Interval: 310 ms

## 2014-04-22 NOTE — Assessment & Plan Note (Signed)
His blood pressure is controlled. He will try to reduce his sodium intake and to lose weight.

## 2014-04-22 NOTE — Assessment & Plan Note (Signed)
We discussed the importance of regular daily exercise and stopping sweetened drinks. He will try to do better.

## 2014-04-22 NOTE — Progress Notes (Signed)
HPI [name] returns today for followup. He is a very pleasant 59 year old man with morbid obesity, a nonischemic cardiomyopathy, complete heart block, status post ICD implantation. When his initial ICD was placed, the patient was unable to receive a left ventricular lead. In the interim, he has been stable. He denies chest pain or shortness of breath. He has minimal peripheral edema. He denies syncope. He is frustrated by his inability to lose and keep off weight. He admits to dietary indiscretion.  Allergies  Allergen Reactions  . Penicillins Other (See Comments)    dizziness     Current Outpatient Prescriptions  Medication Sig Dispense Refill  . acetaminophen (TYLENOL) 500 MG tablet Take 500 mg by mouth every 6 (six) hours as needed for pain.    Marland Kitchen AFLURIA PRESERVATIVE FREE injection Inject 0.25 mLs into the muscle once.     Marland Kitchen albuterol (PROVENTIL HFA;VENTOLIN HFA) 108 (90 BASE) MCG/ACT inhaler Inhale 2 puffs into the lungs every 6 (six) hours as needed. For shortness of breath    . aspirin 81 MG tablet Take 81 mg by mouth daily.    . carvedilol (COREG) 25 MG tablet take 1 tablet by mouth twice a day with food 180 tablet 3  . furosemide (LASIX) 20 MG tablet take 1 tablet by mouth once daily 90 tablet 3  . insulin glargine (LANTUS) 100 UNIT/ML injection Inject 50 Units into the skin 2 (two) times daily.     . Liraglutide (VICTOZA) 18 MG/3ML SOPN Inject 1.2 mg into the skin daily.    . simvastatin (ZOCOR) 40 MG tablet Take 40 mg by mouth every evening.    . tadalafil (CIALIS) 10 MG tablet Take 1 tablet (10 mg total) by mouth daily as needed for erectile dysfunction. 10 tablet 3  . valsartan (DIOVAN) 320 MG tablet take 1 tablet by mouth once daily 90 tablet 0  . vitamin B-12 (CYANOCOBALAMIN) 250 MCG tablet Take 250 mcg by mouth daily.    . vitamin C (ASCORBIC ACID) 500 MG tablet Take 500 mg by mouth daily.     No current facility-administered medications for this visit.     Past  Medical History  Diagnosis Date  . DM (diabetes mellitus)   . Nonischemic cardiomyopathy     EF of 15%  . Sarcoid     "lung"-no problems  . Obesity   . CHF (congestive heart failure)   . Back pain   . Automatic implantable cardioverter-defibrillator in situ   . Sleep apnea 06-28-12    no cpap used-unable to tolerate  . HTN (hypertension)   . Complete heart block     s/p PPM 1997 with upgrade to AICD 2011  . Hyperlipidemia   . Asthma   . Glaucoma   . SVT (supraventricular tachycardia)     s/p ablation  . Depression     ROS:   All systems reviewed and negative except as noted in the HPI.   Past Surgical History  Procedure Laterality Date  . Pacemaker insertion    . Tonsillectomy    . Hemorrhoid surgery      early 20's  . Colonoscopy with propofol N/A 07/17/2012    Procedure: COLONOSCOPY WITH PROPOFOL;  Surgeon: Charolett Bumpers, MD;  Location: [facility];  Service: Endoscopy;  Laterality: N/A;     Family History  Problem Relation Age of Onset  . Diabetes Mother   . Alzheimer's disease Mother   . CVA Father   . Heart failure Father   .  Epilepsy Sister   . Diabetes Brother   . Diabetes Sister      History   Social History  . Marital Status: Married    Spouse Name: N/A  . Number of Children: N/A  . Years of Education: N/A   Occupational History  . Not on file.   Social History Main Topics  . Smoking status: Never Smoker   . Smokeless tobacco: Not on file  . Alcohol Use: Yes     Comment: occasionally  . Drug Use: No  . Sexual Activity: Yes   Other Topics Concern  . Not on file   Social History Narrative     BP 128/84 mmHg  Pulse 103  Ht 5\' 10"  (1.778 m)  Wt 375 lb 6.4 oz (170.28 kg)  BMI 53.86 kg/m2  Physical Exam:  Morbidly obese appearing NAD HEENT: Unremarkable Neck:  7 cm JVD, no thyromegally Lungs:  Clear except for scattered basilar rales. Decreased breath sounds. HEART:  Regular rate rhythm, no murmurs, no rubs, no clicks,  decreased heart sounds. Abd:  soft, obese,positive bowel sounds, no organomegally, no rebound, no guarding Ext:  2 plus pulses, no edema, no cyanosis, no clubbing Skin:  No rashes no nodules Neuro:  CN II through XII intact, motor grossly intact   DEVICE  Normal device function.  See PaceArt for details.   Assess/Plan:

## 2014-04-22 NOTE — Assessment & Plan Note (Signed)
His ICD is working normally. He is RV pacing 99% of the time. Will plan to recheck in several months.

## 2014-04-22 NOTE — Patient Instructions (Signed)
Your physician recommends that you continue on your current medications as directed. Please refer to the Current Medication list given to you today.  Remote monitoring is used to monitor your Pacemaker of ICD from home. This monitoring reduces the number of office visits required to check your device to one time per year. It allows [place] to keep an eye on the functioning of your device to ensure it is working properly. You are scheduled for a device check from home on 07/22/14. You may send your transmission at any time that day. If you have a wireless device, the transmission will be sent automatically. After your physician reviews your transmission, you will receive a postcard with your next transmission date.  Your physician wants you to follow-up in:  One year with Dr. Ladona Ridgel. You will receive a reminder letter in the mail two months in advance. If you don't receive a letter, please call our office to schedule the follow-up appointment.

## 2014-04-22 NOTE — Assessment & Plan Note (Signed)
His symptoms are class 2B. He will continue his current meds. Will hold off on attempted biv implant until the patient develops worsening CHF.

## 2014-04-29 ENCOUNTER — Ambulatory Visit
Admission: RE | Admit: 2014-04-29 | Discharge: 2014-04-29 | Disposition: A | Discharge status: Home / Self Care | Payer: Medicare Other | Source: Ambulatory Visit | Attending: Internal Medicine | Admitting: Internal Medicine

## 2014-04-29 ENCOUNTER — Other Ambulatory Visit: Payer: Self-pay | Admitting: Internal Medicine

## 2014-04-29 DIAGNOSIS — D860 Sarcoidosis of lung: Secondary | ICD-10-CM

## 2014-05-01 ENCOUNTER — Encounter: Payer: Self-pay | Admitting: Cardiology

## 2014-05-01 ENCOUNTER — Telehealth: Payer: Self-pay | Admitting: Cardiology

## 2014-05-01 NOTE — Telephone Encounter (Signed)
Reviewed pacer check and he had several mode switches with longest lasting 1 minutes with peak A 171 and peak V 76bpm.  Please get an event monitor to assess for atrial fibrillation vs. Atrial tachycardia

## 2014-05-03 NOTE — Telephone Encounter (Signed)
This encounter was created in error - please disregard.

## 2014-05-06 NOTE — Telephone Encounter (Signed)
Per [facility], patient was in A-tach.   Per Dr. [name], no monitor is needed.

## 2014-06-26 ENCOUNTER — Other Ambulatory Visit: Payer: Self-pay | Admitting: Internal Medicine

## 2014-07-07 ENCOUNTER — Other Ambulatory Visit: Payer: Self-pay

## 2014-07-22 ENCOUNTER — Ambulatory Visit (INDEPENDENT_AMBULATORY_CARE_PROVIDER_SITE_OTHER): Payer: Medicare Other | Admitting: *Deleted

## 2014-07-22 ENCOUNTER — Encounter: Payer: Self-pay | Admitting: Internal Medicine

## 2014-07-22 DIAGNOSIS — I429 Cardiomyopathy, unspecified: Secondary | ICD-10-CM | POA: Diagnosis not present

## 2014-07-22 DIAGNOSIS — I5022 Chronic systolic (congestive) heart failure: Secondary | ICD-10-CM

## 2014-07-22 DIAGNOSIS — I428 Other cardiomyopathies: Secondary | ICD-10-CM

## 2014-07-23 NOTE — Progress Notes (Signed)
Remote ICD transmission.   

## 2014-07-31 LAB — CUP PACEART REMOTE DEVICE CHECK
Battery Remaining Longevity: 43 mo
Battery Remaining Percentage: 46 %
Battery Voltage: 2.92 V
Brady Statistic AS VS Percent: 1.6 %
Brady Statistic RA Percent Paced: 3 %
Brady Statistic RV Percent Paced: 97 %
HIGH POWER IMPEDANCE MEASURED VALUE: 41 Ohm
Lead Channel Pacing Threshold Amplitude: 0.625 V
Lead Channel Pacing Threshold Pulse Width: 0.5 ms
Lead Channel Sensing Intrinsic Amplitude: 1.2 mV
Lead Channel Sensing Intrinsic Amplitude: 3.5 mV
Lead Channel Setting Pacing Amplitude: 2 V
Lead Channel Setting Pacing Amplitude: 2 V
Lead Channel Setting Pacing Pulse Width: 0.5 ms
MDC IDC MSMT LEADCHNL RA IMPEDANCE VALUE: 390 Ohm
MDC IDC MSMT LEADCHNL RV IMPEDANCE VALUE: 350 Ohm
MDC IDC SESS DTM: 20160712083350
MDC IDC SET LEADCHNL RV SENSING SENSITIVITY: 2 mV
MDC IDC STAT BRADY AP VP PERCENT: 3.9 %
MDC IDC STAT BRADY AP VS PERCENT: 1 %
MDC IDC STAT BRADY AS VP PERCENT: 93 %
Pulse Gen Serial Number: 629155
Zone Setting Detection Interval: 310 ms
Zone Setting Detection Interval: 350 ms

## 2014-08-15 ENCOUNTER — Encounter: Payer: Self-pay | Admitting: Cardiology

## 2014-09-30 ENCOUNTER — Other Ambulatory Visit: Payer: Self-pay | Admitting: Internal Medicine

## 2014-10-07 ENCOUNTER — Other Ambulatory Visit: Payer: Self-pay

## 2014-10-07 NOTE — Telephone Encounter (Signed)
Approved      Disp Refills Start End    furosemide (LASIX) 20 MG tablet 90 tablet 3 11/25/2013     Sig:  take 1 tablet by mouth once daily    Class:  Normal    DAW:  No    Authorizing Provider:  Quintella Reichert, MD    Ordering User:  Judy Pimple, LPN      Visit Pharmacy     [id number] - [place], [place] - [address]

## 2014-10-10 ENCOUNTER — Other Ambulatory Visit: Payer: Self-pay | Admitting: *Deleted

## 2014-10-10 MED ORDER — FUROSEMIDE 20 MG PO TABS: ORAL_TABLET | ORAL | Status: DC

## 2014-10-22 ENCOUNTER — Other Ambulatory Visit: Payer: Self-pay | Admitting: Cardiology

## 2014-10-22 MED ORDER — FUROSEMIDE 20 MG PO TABS: ORAL_TABLET | ORAL | Status: DC

## 2014-10-27 ENCOUNTER — Ambulatory Visit (INDEPENDENT_AMBULATORY_CARE_PROVIDER_SITE_OTHER): Payer: Medicare Other | Admitting: *Deleted

## 2014-10-27 DIAGNOSIS — I5022 Chronic systolic (congestive) heart failure: Secondary | ICD-10-CM | POA: Diagnosis not present

## 2014-10-27 DIAGNOSIS — I428 Other cardiomyopathies: Secondary | ICD-10-CM

## 2014-10-27 DIAGNOSIS — I429 Cardiomyopathy, unspecified: Secondary | ICD-10-CM

## 2014-10-27 LAB — CUP PACEART REMOTE DEVICE CHECK
Battery Remaining Percentage: 43 %
Brady Statistic AP VP Percent: 3.8 %
Brady Statistic AS VP Percent: 93 %
Brady Statistic AS VS Percent: 1.8 %
Date Time Interrogation Session: 20161017110648
HighPow Impedance: 42 Ohm
Implantable Lead Implant Date: 19961213
Implantable Lead Model: 5524
Implantable Lead Model: 7121
Lead Channel Impedance Value: 380 Ohm
Lead Channel Impedance Value: 390 Ohm
Lead Channel Pacing Threshold Amplitude: 0.75 V
Lead Channel Setting Pacing Amplitude: 2 V
Lead Channel Setting Pacing Pulse Width: 0.5 ms
Lead Channel Setting Sensing Sensitivity: 2 mV
MDC IDC LEAD IMPLANT DT: 20111202
MDC IDC LEAD LOCATION: 753859
MDC IDC LEAD LOCATION: 753860
MDC IDC MSMT BATTERY REMAINING LONGEVITY: 40 mo
MDC IDC MSMT BATTERY VOLTAGE: 2.9 V
MDC IDC MSMT LEADCHNL RA SENSING INTR AMPL: 1.7 mV
MDC IDC MSMT LEADCHNL RV PACING THRESHOLD PULSEWIDTH: 0.5 ms
MDC IDC SET LEADCHNL RA PACING AMPLITUDE: 2 V
MDC IDC STAT BRADY AP VS PERCENT: 1 %
MDC IDC STAT BRADY RA PERCENT PACED: 2.8 %
MDC IDC STAT BRADY RV PERCENT PACED: 97 %
Pulse Gen Serial Number: 629155

## 2014-10-27 NOTE — Progress Notes (Signed)
Remote ICD transmission.   

## 2014-11-27 ENCOUNTER — Encounter: Payer: Self-pay | Admitting: Dietician

## 2014-11-27 ENCOUNTER — Encounter: Payer: Medicare Other | Attending: Internal Medicine | Admitting: Dietician

## 2014-11-27 DIAGNOSIS — E119 Type 2 diabetes mellitus without complications: Secondary | ICD-10-CM | POA: Insufficient documentation

## 2014-11-27 DIAGNOSIS — Z713 Dietary counseling and surveillance: Secondary | ICD-10-CM | POA: Insufficient documentation

## 2014-11-27 DIAGNOSIS — Z794 Long term (current) use of insulin: Secondary | ICD-10-CM | POA: Insufficient documentation

## 2014-11-27 DIAGNOSIS — Z6841 Body Mass Index (BMI) 40.0 and over, adult: Secondary | ICD-10-CM | POA: Diagnosis not present

## 2014-11-27 NOTE — Progress Notes (Signed)
Medical Nutrition Therapy:  Appt start time: 0945 end time:  1045.   Assessment:  Primary concerns today: obesity. Hx of HTN, HLP, and T2DM.  Patient has goals to lose weight.  He states he eats the "wrong types of foods and can't back away from the table" referring to his large portion sizes and tendencies to go back for seconds when he has cooked enough for left overs.  He states he loves cookies and has a big sweet tooth, he will not have sweets often but when he does he "goes overboard."  He eats at the dining room table without distractions.  He goes out to eat 3-4x/week.  At home he likes to cook occasionally but mainly relies on quick and easy items.  He exercises regularly but is limited due to back pain.  He prefers wading around in the pool or light weight lifting. He states when he eats he often feels tired afterwards and so he will sometimes skip meals to avoid feeling tired.  His sleep patterns are often interrupted by getting up to urinate in the night and so he will often nap in the afternoon/late evening.  Sometimes this causes him to skip his evening meal and he will snack later in the night.  He is skeptical about losing weight at his age but appears motivated.   Preferred Learning Style:  No preference indicated   Learning Readiness:  Ready  MEDICATIONS: see list   DIETARY INTAKE:  Usual eating pattern includes 1-3 meals and 1-2 snacks per day.  Everyday foods include grits, convenience foods.  24-hr recall:  B (8:30-9 AM): grits and bacon or eggs, coffee or McD[employer] sandwich 1-2x/week or skips Snk (10-11 AM): apple and peanut butter or none  L (3 PM): [employer]'s meal or chicken wings baked in the oven with mashed potatoes and green peas or skips Snk ( PM): none D ( PM): same as lunch or skips Snk (10-11 PM): apple or sandwich or peanut butter on spoon Beverages: water, coffee 2x/week, sweet tea occasionally    Usual physical activity: Pool for 1 hour 2x/week, lifts  weights for approx. 1-2 hours 3-4 x/week  Progress Towards Goal(s):  In progress.   Nutritional Diagnosis:  NB-1.1 Food and nutrition-related knowledge deficit As related to no prior formal nutrition education for weight management.  As evidenced by patient report and request for a meal guide to help him lose weight.    Intervention:  Nutrition education and counseling for weight management and blood sugar management with diet/lifestyle strategies.  Discussed in depth the importance of regular meal timing, having meals and snacks about every 3-5 hours throughout his day.  Described how skipping meals can affect metabolism and appetite levels later in the day.  Discussed mindful eating techniques: slowing down when eating, making meals last 20-30 min, continuing to eat without distractions, getting the most enjoyment and satisfaction from eating experiences.  Stated role of deprivation/restriction in making foods more desirable and easier to binge on when around a "treat" food we may not have often.  Encouraged eating more wholesome, natural foods at home versus prepackaged convenience foods.  Provided sample quick and easy meals.  Utilized the diabetes portion plate to encourage increased vegetable consumption and demonstrated using this tool for healthy, balanced meals.  His main goal is to cook only what he wants to eat for that meal time so that he is not tempted to eat more in order to prevent wasting food.  Discussed importance of  exercise and praised him for making that a part of his normal, weekly routine.  Encouraged continued exercise of at least 250 min per week.  Discussed realistic, sustainable weight loss goals.  Goals: Cooking only what is needed for one meal.  Eat slowly making meals last 20 min. Work on reducing portion sizes which will be easier with regular meals and snacks to prevent being overly hungry/ravenous at meal times.  Teaching Method Utilized: Visual Auditory Hands  on  Handouts given during visit include:  Meal Options for Men (45 g of carb per meal)  Diabetes Portion Plate  Low Carb Snack Suggestions  Nutrition Strategies for Weight Loss  Barriers to learning/adherence to lifestyle change: none  Demonstrated degree of understanding via:  Teach Back   Monitoring/Evaluation:  Dietary intake, exercise, labs, and body weight in 2 month(s).

## 2014-12-02 ENCOUNTER — Encounter: Payer: Self-pay | Admitting: Cardiology

## 2014-12-02 ENCOUNTER — Ambulatory Visit (INDEPENDENT_AMBULATORY_CARE_PROVIDER_SITE_OTHER): Payer: Medicare Other | Admitting: Cardiology

## 2014-12-02 VITALS — BP 96/70 | HR 83 | Ht 70.0 in | Wt 382.4 lb

## 2014-12-02 DIAGNOSIS — I10 Essential (primary) hypertension: Secondary | ICD-10-CM | POA: Diagnosis not present

## 2014-12-02 DIAGNOSIS — E785 Hyperlipidemia, unspecified: Secondary | ICD-10-CM

## 2014-12-02 DIAGNOSIS — I429 Cardiomyopathy, unspecified: Secondary | ICD-10-CM

## 2014-12-02 DIAGNOSIS — I77810 Thoracic aortic ectasia: Secondary | ICD-10-CM | POA: Diagnosis not present

## 2014-12-02 DIAGNOSIS — I5022 Chronic systolic (congestive) heart failure: Secondary | ICD-10-CM

## 2014-12-02 DIAGNOSIS — I471 Supraventricular tachycardia: Secondary | ICD-10-CM | POA: Diagnosis not present

## 2014-12-02 DIAGNOSIS — I428 Other cardiomyopathies: Secondary | ICD-10-CM

## 2014-12-02 DIAGNOSIS — I442 Atrioventricular block, complete: Secondary | ICD-10-CM

## 2014-12-02 NOTE — Patient Instructions (Signed)

## 2014-12-02 NOTE — Progress Notes (Signed)
Cardiology Office Note   Date:  12/02/2014   ID:  [name], DOB May 13, 1955, MRN [id number]  PCP:  Georgann Housekeeper, MD    Chief Complaint  Patient presents with  . Congestive Heart Failure    no refills  . Cardiomyopathy      History of Present Illness: [name] is a 59 y.o. male with a history of nonischemic DCM, Sarcoidosis, chronic systolic CHF, HTN, CHB s/p AICD, and SVT s/p ablation. He is doing well. He denies any chest pain, palpitations, Dizziness or syncope . He has chronic LE edema which he says is very well controlled on diuretics. He has chronic DOE which is stable.  His weight is up 7 lbs.  He goes to the gym 3 times weekly but works with weights and no aerobic activity.  He has gained 7lbs since he saw Dr. Ladona Ridgel.  Past Medical History  Diagnosis Date  . DM (diabetes mellitus) (HCC)   . Nonischemic cardiomyopathy (HCC)     EF of 15%  . Sarcoid (HCC)     "lung"-no problems  . Obesity   . CHF (congestive heart failure) (HCC)   . Back pain   . Automatic implantable cardioverter-defibrillator in situ   . Sleep apnea 06-28-12    no cpap used-unable to tolerate  . HTN (hypertension)   . Complete heart block [facility])     s/p PPM 1997 with upgrade to AICD 2011  . Hyperlipidemia   . Asthma   . Glaucoma   . SVT (supraventricular tachycardia) (HCC)     s/p ablation  . Depression     Past Surgical History  Procedure Laterality Date  . Pacemaker insertion    . Tonsillectomy    . Hemorrhoid surgery      early 20's  . Colonoscopy with propofol N/A 07/17/2012    Procedure: COLONOSCOPY WITH PROPOFOL;  Surgeon: Charolett Bumpers, MD;  Location: [facility];  Service: Endoscopy;  Laterality: N/A;     Current Outpatient Prescriptions  Medication Sig Dispense Refill  . acetaminophen (TYLENOL) 500 MG tablet Take 500 mg by mouth every 6 (six) hours as needed for pain.    Marland Kitchen AFLURIA PRESERVATIVE FREE injection Inject 0.25 mLs into the  muscle once.     Marland Kitchen albuterol (PROVENTIL HFA;VENTOLIN HFA) 108 (90 BASE) MCG/ACT inhaler Inhale 2 puffs into the lungs every 6 (six) hours as needed. For shortness of breath    . aspirin 81 MG tablet Take 81 mg by mouth daily.    . carvedilol (COREG) 25 MG tablet take 1 tablet by mouth twice a day 180 tablet 3  . furosemide (LASIX) 20 MG tablet take 1 tablet by mouth once daily 30 tablet 1  . insulin glargine (LANTUS) 100 UNIT/ML injection Inject 50 Units into the skin 2 (two) times daily.     . Liraglutide (VICTOZA) 18 MG/3ML SOPN Inject 1.2 mg into the skin daily.    . simvastatin (ZOCOR) 40 MG tablet Take 40 mg by mouth every evening.    . tadalafil (CIALIS) 10 MG tablet Take 1 tablet (10 mg total) by mouth daily as needed for erectile dysfunction. 10 tablet 3  . valsartan (DIOVAN) 320 MG tablet take 1 tablet by mouth once daily 90 tablet 3  . vitamin B-12 (CYANOCOBALAMIN) 250 MCG tablet Take 250 mcg by mouth daily.    . vitamin C (ASCORBIC ACID) 500 MG  tablet Take 500 mg by mouth daily.     No current facility-administered medications for this visit.    Allergies:   Penicillins    Social History:  The patient  reports that he has never smoked. He does not have any smokeless tobacco history on file. He reports that he drinks alcohol. He reports that he does not use illicit drugs.   Family History:  The patient's family history includes Alzheimer's disease in his mother; CVA in his father; Diabetes in his brother, mother, and sister; Epilepsy in his sister; Heart failure in his father.    ROS:  Please see the history of present illness.   Otherwise, review of systems are positive for none.   All other systems are reviewed and negative.    PHYSICAL EXAM: VS:  BP 96/70 mmHg  Pulse 83  Ht 5\' 10"  (1.778 m)  Wt 173.456 kg (382 lb 6.4 oz)  BMI 54.87 kg/m2 , BMI Body mass index is 54.87 kg/(m^2). GEN: Well nourished, well developed, in no acute distress HEENT: normal Neck: no JVD,  carotid bruits, or masses Cardiac: RRR; no murmurs, rubs, or gallops,no edema  Respiratory:  clear to auscultation bilaterally, normal work of breathing GI: soft, nontender, nondistended, + BS MS: no deformity or atrophy Skin: warm and dry, no rash Neuro:  Strength and sensation are intact Psych: euthymic mood, full affect   EKG:  EKG was ordered today SNR with V pacing and PACs    Recent Labs: No results found for requested labs within last 365 days.    Lipid Panel    Component Value Date/Time   CHOL  09/22/2009 0330    134        ATP III CLASSIFICATION:  <200     mg/dL   Desirable  244-010  mg/dL   Borderline High  >=272    mg/dL   High          TRIG 536 09/22/2009 0330   HDL 47 09/22/2009 0330   CHOLHDL 2.9 09/22/2009 0330   VLDL 25 09/22/2009 0330   LDLCALC  09/22/2009 0330    62        Total Cholesterol/HDL:CHD Risk Coronary Heart Disease Risk Table                     Men   Women  1/2 Average Risk   3.4   3.3  Average Risk       5.0   4.4  2 X Average Risk   9.6   7.1  3 X Average Risk  23.4   11.0        Use the calculated Patient Ratio above and the CHD Risk Table to determine the patient's CHD Risk.        ATP III CLASSIFICATION (LDL):  <100     mg/dL   Optimal  644-034  mg/dL   Near or Above                    Optimal  130-159  mg/dL   Borderline  742-595  mg/dL   High  >638     mg/dL   Very High      Wt Readings from Last 3 Encounters:  12/02/14 173.456 kg (382 lb 6.4 oz)  11/27/14 174.136 kg (383 lb 14.4 oz)  04/22/14 170.28 kg (375 lb 6.4 oz)     ASSESSMENT AND PLAN:  1. Nonischemic DCM EF 35-40% by echo 2013 2. HTN -  borderline control - continue Carvedilol/Diovan - check BMET 3. Complete heart block s/p PPM and then upgrade to AICD.  4. Chronic systolic CHF- appears well compensated on exam - continue Carvedilol/Lasix/Diovan  5. Mildly dilated aortic root - repeat 2D    Current  medicines are reviewed at length with the patient today.  The patient does not have concerns regarding medicines.  The following changes have been made:  no change  Labs/ tests ordered today: See above Assessment and Plan No orders of the defined types were placed in this encounter.     Disposition:   FU with me in 1 year  Signed, Quintella Reichert, MD  12/02/2014 1:38 PM    [facility] [address] [place], [place], [place]  [address] Phone: [phone]; Fax: [phone]

## 2014-12-15 ENCOUNTER — Encounter: Payer: Self-pay | Admitting: Cardiology

## 2015-01-08 ENCOUNTER — Encounter: Payer: Medicare Other | Attending: Internal Medicine | Admitting: Dietician

## 2015-01-08 DIAGNOSIS — Z6841 Body Mass Index (BMI) 40.0 and over, adult: Secondary | ICD-10-CM | POA: Insufficient documentation

## 2015-01-08 DIAGNOSIS — E118 Type 2 diabetes mellitus with unspecified complications: Secondary | ICD-10-CM

## 2015-01-08 DIAGNOSIS — E119 Type 2 diabetes mellitus without complications: Secondary | ICD-10-CM | POA: Diagnosis not present

## 2015-01-08 DIAGNOSIS — Z713 Dietary counseling and surveillance: Secondary | ICD-10-CM | POA: Diagnosis not present

## 2015-01-08 DIAGNOSIS — Z794 Long term (current) use of insulin: Secondary | ICD-10-CM | POA: Insufficient documentation

## 2015-01-08 NOTE — Progress Notes (Signed)
  Medical Nutrition Therapy:  Appt start time: 1400 end time:  1445.   Assessment: Primary concerns today: Follow-up for obesity. Hx of HTN, HLP, and T2DM.  Self-reports he has lost 5 lbs since our last visit.  Reports that he lost more but then regained some over the holidays.  He is weighing himself once a day at home.  Checking his BG 3-4 times a day.  Had one low reading in the last two weeks, a 69 in the morning, and treated with 1/2 cup of apple juice.  His highest reading over the last two weeks was 220-230 and he states he typically gets a reading in the 200s approximately 2x/week.  He was keeping a food journal for about 2-3 weeks after our visit and then fell out of the habit.  He states that he realized he only eats 1-2 larger meals each day, which is similar to his routine as a child when his mom would cook for him.  He also noticed that he was able to cut back drastically on his intake of sweets and desserts.  He states with the holidays he has started snacking on sweet foods again and wants to refocus on cutting back on the amount of these foods he consumes.  He reports still eating at a fast pace and skipping meals earlier in the day.  He is trying to eat breakfast and snacks more often - has been choosing corn flakes with unsweetened coconut milk and fruit for snacks.  He has been fairly consistent with his exercise and expresses interest in increasing his exercise levels.  As a retired man he is sometimes unmotivated to get up and do things and his sleeping patterns are erratic.  He recognizes that going to the gym first thing in the morning may help him be motivated to stay active throughout the day instead of "being a couch potato."  He also has noticed that on nights when he works out late in the evening he will sometime have low BG readings in the morning following.  He takes Lantus at night.  Again, this may be an additional reason for him to exercise in the morning, checking his blood sugar  before beginning exercise.  Usual physical activity: Pool for 1 hour 2x/week, lifts weights for approx. 1-2 hours 3-4 x/week.   Goals: Increase exercise from 3 days to 5 days a week.  Eat smaller, more frequent meals. Try eating at least one meal before noon.  His personal weight goal is to lose another 5 lbs by our next visit.  Progress Towards Goal(s):  In progress.   Monitoring/Evaluation:  Dietary intake, exercise, labs, and body weight in 2 month(s).

## 2015-01-16 ENCOUNTER — Other Ambulatory Visit: Payer: Self-pay | Admitting: Cardiology

## 2015-01-16 MED ORDER — FUROSEMIDE 20 MG PO TABS: ORAL_TABLET | ORAL | Status: DC

## 2015-01-26 ENCOUNTER — Ambulatory Visit (INDEPENDENT_AMBULATORY_CARE_PROVIDER_SITE_OTHER): Payer: Medicare Other | Admitting: *Deleted

## 2015-01-26 DIAGNOSIS — I5022 Chronic systolic (congestive) heart failure: Secondary | ICD-10-CM

## 2015-01-26 DIAGNOSIS — I429 Cardiomyopathy, unspecified: Secondary | ICD-10-CM | POA: Diagnosis not present

## 2015-01-26 DIAGNOSIS — I428 Other cardiomyopathies: Secondary | ICD-10-CM

## 2015-01-27 NOTE — Progress Notes (Signed)
Remote ICD transmission.   

## 2015-01-30 ENCOUNTER — Encounter: Payer: Self-pay | Admitting: Cardiology

## 2015-01-30 LAB — CUP PACEART REMOTE DEVICE CHECK
Battery Remaining Percentage: 40 %
Battery Voltage: 2.9 V
Brady Statistic AP VP Percent: 3.9 %
Brady Statistic AP VS Percent: 1 %
Brady Statistic AS VP Percent: 93 %
Brady Statistic AS VS Percent: 1.5 %
Brady Statistic RA Percent Paced: 3 %
Brady Statistic RV Percent Paced: 97 %
HIGH POWER IMPEDANCE MEASURED VALUE: 38 Ohm
Implantable Lead Implant Date: 19961213
Implantable Lead Location: 753859
Lead Channel Impedance Value: 340 Ohm
Lead Channel Pacing Threshold Amplitude: 0.75 V
Lead Channel Pacing Threshold Pulse Width: 0.5 ms
Lead Channel Sensing Intrinsic Amplitude: 1.2 mV
Lead Channel Sensing Intrinsic Amplitude: 9.2 mV
Lead Channel Setting Sensing Sensitivity: 2 mV
MDC IDC LEAD IMPLANT DT: 20111202
MDC IDC LEAD LOCATION: 753860
MDC IDC LEAD MODEL: 7121
MDC IDC MSMT BATTERY REMAINING LONGEVITY: 36 mo
MDC IDC MSMT LEADCHNL RA IMPEDANCE VALUE: 360 Ohm
MDC IDC PG SERIAL: 629155
MDC IDC SESS DTM: 20170116153837
MDC IDC SET LEADCHNL RA PACING AMPLITUDE: 2 V
MDC IDC SET LEADCHNL RV PACING AMPLITUDE: 2 V
MDC IDC SET LEADCHNL RV PACING PULSEWIDTH: 0.5 ms

## 2015-03-05 ENCOUNTER — Ambulatory Visit: Payer: Medicare Other | Admitting: Dietician

## 2015-04-16 ENCOUNTER — Ambulatory Visit: Payer: Medicare Other | Admitting: Dietician

## 2015-04-28 ENCOUNTER — Encounter: Payer: Self-pay | Admitting: Internal Medicine

## 2015-04-28 ENCOUNTER — Ambulatory Visit (INDEPENDENT_AMBULATORY_CARE_PROVIDER_SITE_OTHER): Payer: Medicare Other | Admitting: Internal Medicine

## 2015-04-28 VITALS — BP 118/76 | HR 143 | Ht 70.0 in | Wt 384.2 lb

## 2015-04-28 DIAGNOSIS — I5022 Chronic systolic (congestive) heart failure: Secondary | ICD-10-CM

## 2015-04-28 MED ORDER — FUROSEMIDE 40 MG PO TABS: ORAL_TABLET | ORAL | Status: DC

## 2015-04-28 NOTE — Progress Notes (Signed)
HPI [name] returns today for followup. He is a very pleasant 60 year old man with morbid obesity, a nonischemic cardiomyopathy, complete heart block, status post ICD implantation. When his initial ICD was placed, the patient was unable to receive a left ventricular lead. In the interim, he has been stable. He denies chest pain but has had some worsening dyspnea. He admits to dietary indiscretion. He has minimal peripheral edema. He denies syncope. He is frustrated by his inability to lose and keep off weight.  Allergies  Allergen Reactions  . Penicillins Other (See Comments)    dizziness     Current Outpatient Prescriptions  Medication Sig Dispense Refill  . acetaminophen (TYLENOL) 500 MG tablet Take 500 mg by mouth every 6 (six) hours as needed for pain.    Marland Kitchen AFLURIA PRESERVATIVE FREE injection Inject 0.25 mLs into the muscle once.     Marland Kitchen albuterol (PROVENTIL HFA;VENTOLIN HFA) 108 (90 BASE) MCG/ACT inhaler Inhale 2 puffs into the lungs every 6 (six) hours as needed. For shortness of breath    . aspirin 81 MG tablet Take 81 mg by mouth daily.    . carvedilol (COREG) 25 MG tablet take 1 tablet by mouth twice a day 180 tablet 3  . furosemide (LASIX) 20 MG tablet take 1 tablet by mouth once daily 30 tablet 11  . insulin glargine (LANTUS) 100 UNIT/ML injection Inject 50 Units into the skin 2 (two) times daily.     . Liraglutide (VICTOZA) 18 MG/3ML SOPN Inject 1.2 mg into the skin daily.    . simvastatin (ZOCOR) 40 MG tablet Take 40 mg by mouth every evening.    . tadalafil (CIALIS) 10 MG tablet Take 1 tablet (10 mg total) by mouth daily as needed for erectile dysfunction. 10 tablet 3  . valsartan (DIOVAN) 320 MG tablet take 1 tablet by mouth once daily 90 tablet 3  . vitamin B-12 (CYANOCOBALAMIN) 250 MCG tablet Take 250 mcg by mouth daily.    . vitamin C (ASCORBIC ACID) 500 MG tablet Take 500 mg by mouth daily.     No current facility-administered medications for this visit.     Past  Medical History  Diagnosis Date  . DM (diabetes mellitus) (HCC)   . Nonischemic cardiomyopathy (HCC)     EF of 15%  . Sarcoid (HCC)     "lung"-no problems  . Obesity   . CHF (congestive heart failure) (HCC)   . Back pain   . Automatic implantable cardioverter-defibrillator in situ   . Sleep apnea 06-28-12    no cpap used-unable to tolerate  . HTN (hypertension)   . Complete heart block [facility])     s/p PPM 1997 with upgrade to AICD 2011  . Hyperlipidemia   . Asthma   . Glaucoma   . SVT (supraventricular tachycardia) (HCC)     s/p ablation  . Depression     ROS:   All systems reviewed and negative except as noted in the HPI.   Past Surgical History  Procedure Laterality Date  . Pacemaker insertion    . Tonsillectomy    . Hemorrhoid surgery      early 20's  . Colonoscopy with propofol N/A 07/17/2012    Procedure: COLONOSCOPY WITH PROPOFOL;  Surgeon: Charolett Bumpers, MD;  Location: [facility];  Service: Endoscopy;  Laterality: N/A;     Family History  Problem Relation Age of Onset  . Diabetes Mother   . Alzheimer's disease Mother   . CVA Father   .  Heart failure Father   . Epilepsy Sister   . Diabetes Brother   . Diabetes Sister      Social History   Social History  . Marital Status: Married    Spouse Name: N/A  . Number of Children: N/A  . Years of Education: N/A   Occupational History  . Not on file.   Social History Main Topics  . Smoking status: Never Smoker   . Smokeless tobacco: Not on file  . Alcohol Use: Yes     Comment: occasionally  . Drug Use: No  . Sexual Activity: Yes   Other Topics Concern  . Not on file   Social History Narrative     BP 118/76 mmHg  Pulse 143  Ht 5\' 10"  (1.778 m)  Wt 384 lb 3.2 oz (174.272 kg)  BMI 55.13 kg/m2  Physical Exam:  Morbidly obese appearing NAD HEENT: Unremarkable Neck:  7 cm JVD, no thyromegally Lungs:  Clear except for scattered basilar rales. Decreased breath sounds. HEART:  Regular rate  rhythm, no murmurs, no rubs, no clicks, decreased heart sounds. Abd:  soft, obese,positive bowel sounds, no organomegally, no rebound, no guarding Ext:  2 plus pulses, no edema, no cyanosis, no clubbing Skin:  No rashes no nodules Neuro:  CN II through XII intact, motor grossly intact  ECG - NSR with RV pacing with a QRS of 200 ms  DEVICE  Normal device function.  See PaceArt for details.   Assess/Plan: 1. Chronic systolic heart failure -his symptoms are class 2-3. He admits to dietary indiscretion and we will increase his lasix to 40 mg daily 2. Obesity - i have strongly encouraged him to lose weight.  3. ICD - his [employer] device is working normally. Will recheck in several months. 4. Complete heart block - his escape is aroung 35/min. He will likely need attempted biv upgrade when he reaches ERI.   Leonia Reeves.D.

## 2015-04-28 NOTE — Patient Instructions (Signed)
Medication Instructions:  Your physician has recommended you make the following change in your medication:  1) Increase Furosemide to 40 mg daily    Labwork: Your physician recommends that you return for lab work in: 2 weeks: BMP    Testing/Procedures: None ordered   Follow-Up: Your physician wants you to follow-up in: 12 months with Dr Court Joy will receive a reminder letter in the mail two months in advance. If you don't receive a letter, please call our office to schedule the follow-up appointment.   Remote monitoring is used to monitor your  ICD from home. This monitoring reduces the number of office visits required to check your device to one time per year. It allows [place] to keep an eye on the functioning of your device to ensure it is working properly. You are scheduled for a device check from home on 07/28/15. You may send your transmission at any time that day. If you have a wireless device, the transmission will be sent automatically. After your physician reviews your transmission, you will receive a postcard with your next transmission date.    Any Other Special Instructions Will Be Listed Below (If Applicable).     If you need a refill on your cardiac medications before your next appointment, please call your pharmacy.

## 2015-05-04 NOTE — Addendum Note (Signed)
Addended by: Reesa Chew on: 05/04/2015 04:35 PM   Modules accepted: Orders

## 2015-05-12 ENCOUNTER — Other Ambulatory Visit (INDEPENDENT_AMBULATORY_CARE_PROVIDER_SITE_OTHER): Payer: Medicare Other

## 2015-05-12 DIAGNOSIS — I5022 Chronic systolic (congestive) heart failure: Secondary | ICD-10-CM | POA: Diagnosis not present

## 2015-05-12 LAB — BASIC METABOLIC PANEL
BUN: 13 mg/dL (ref 7–25)
CO2: 26 mmol/L (ref 20–31)
Calcium: 9.3 mg/dL (ref 8.6–10.3)
Chloride: 104 mmol/L (ref 98–110)
Creat: 1.04 mg/dL (ref 0.70–1.33)
Glucose, Bld: 133 mg/dL — ABNORMAL HIGH (ref 65–99)
Potassium: 4.3 mmol/L (ref 3.5–5.3)
Sodium: 138 mmol/L (ref 135–146)

## 2015-05-22 ENCOUNTER — Ambulatory Visit (INDEPENDENT_AMBULATORY_CARE_PROVIDER_SITE_OTHER): Payer: Medicare Other | Admitting: Internal Medicine

## 2015-05-22 ENCOUNTER — Encounter: Payer: Self-pay | Admitting: Internal Medicine

## 2015-05-22 VITALS — BP 120/82 | HR 91 | Ht 70.0 in | Wt 385.8 lb

## 2015-05-22 DIAGNOSIS — R062 Wheezing: Secondary | ICD-10-CM | POA: Insufficient documentation

## 2015-05-22 DIAGNOSIS — R0600 Dyspnea, unspecified: Secondary | ICD-10-CM | POA: Diagnosis not present

## 2015-05-22 DIAGNOSIS — R0689 Other abnormalities of breathing: Secondary | ICD-10-CM

## 2015-05-22 DIAGNOSIS — Z862 Personal history of diseases of the blood and blood-forming organs and certain disorders involving the immune mechanism: Secondary | ICD-10-CM

## 2015-05-22 DIAGNOSIS — Z8709 Personal history of other diseases of the respiratory system: Secondary | ICD-10-CM

## 2015-05-22 LAB — NITRIC OXIDE: Nitric Oxide: 29

## 2015-05-22 MED ORDER — BUDESONIDE 180 MCG/ACT IN AEPB: 2.0000 | INHALATION_SPRAY | Freq: Two times a day (BID) | RESPIRATORY_TRACT | Status: DC

## 2015-05-22 NOTE — Progress Notes (Signed)
Subjective:    Patient ID: [name], male    DOB: 02-15-1955, 60 y.o.   MRN: [id number] PCP Georgann Housekeeper, MD  HPI  IOV 05/22/2015  Chief Complaint  Patient presents with  . Pulmonary Consult    Referred by [name]; Asthma/ Sarcoidosis; SOB; chest tightness.  non-productive cough.    60 year-old morbidly obese male with nonischemic cardiac myopathy and chronic back problems and sleep apnea followed by Dr. [name] referred for evaluation of possible sarcoidosis and asthma.  He migrated here in 46 from [place]. At that time he was in his 30s. He says he had like myalgias and was diagnosed with pulmonary sarcoidosis by Dr. [name] at [facility]. He remembers having a bronchoscopy and transbronchial biopsy. Further details unknown. Other than the fact he took prednisone for few to several months. He says over the next few years as sarcoidosis resolved according to follow at [facility]. And then some 15 years ago was diagnosed with nonischemic cardiomyopathy and his been disabled. Then some 20 years ago was diagnosed to have asthma by his primary care physician. He had asthma when away but in the last year or so it has come back. For the last 6 months he been using albuterol rescue at least several times daily. It is associated with wheeze. Symptoms of asthma include dyspnea on exertion and weakness at rest. Wheezing is made worse by cold temperature and relieved by albuterol. Moderate in intensity. There is no fever or sputum production.   Asthma control questionnaire.: He never wakes up in the middle of the night because of asthma symptoms. When he wakes up in the morning he perceives very mild symptoms. He feels very mildly limited because of asthma with his activities. He experiences a moderate amount of shortness of breath with exertion relieved by rest but also made worse by cold temperature and relieved by albuterol which he perceives is because of asthma. He wheezes a  moderate amount of the time and uses albuterol for rescue at least 3-4 puffs daily. 5. question as 1.6 and shows active symptoms.   FEno  - 29ppb and borderline - this is with patient taking albuterol as needed. He is not on inhaled steroids. He is noted to be a nonspecific beta blocker carvedilol. Visualization of 5 chest x-ray from 2007 all the way to 2016 show clear lung fields without any mediastinal adenopathy. He does not have any CT chest blood work review shows hemoglobin 12 g percent and normal creatinine.        has a past medical history of DM (diabetes mellitus) (HCC); Nonischemic cardiomyopathy (HCC); Sarcoid (HCC); Obesity; CHF (congestive heart failure) (HCC); Back pain; Automatic implantable cardioverter-defibrillator in situ; Sleep apnea (06-28-12); HTN (hypertension); Complete heart block (HCC); Hyperlipidemia; Asthma; Glaucoma; SVT (supraventricular tachycardia) (HCC); and Depression.   reports that he has never smoked. He does not have any smokeless tobacco history on file.  Past Surgical History  Procedure Laterality Date  . Pacemaker insertion    . Tonsillectomy    . Hemorrhoid surgery      early 20's  . Colonoscopy with propofol N/A 07/17/2012    Procedure: COLONOSCOPY WITH PROPOFOL;  Surgeon: Charolett Bumpers, MD;  Location: [facility];  Service: Endoscopy;  Laterality: N/A;    Allergies  Allergen Reactions  . Penicillins Other (See Comments)    dizziness    Immunization History  Administered Date(s) Administered  . Influenza-Unspecified 11/11/2014    Family History  Problem  Relation Age of Onset  . Diabetes Mother   . Alzheimer's disease Mother   . CVA Father   . Heart failure Father   . Epilepsy Sister   . Diabetes Brother   . Diabetes Sister      Current outpatient prescriptions:  .  acetaminophen (TYLENOL) 500 MG tablet, Take 500 mg by mouth every 6 (six) hours as needed for pain., Disp: , Rfl:  .  albuterol (PROVENTIL HFA;VENTOLIN HFA) 108  (90 BASE) MCG/ACT inhaler, Inhale 2 puffs into the lungs every 6 (six) hours as needed. For shortness of breath, Disp: , Rfl:  .  aspirin 81 MG tablet, Take 81 mg by mouth daily., Disp: , Rfl:  .  carvedilol (COREG) 25 MG tablet, take 1 tablet by mouth twice a day, Disp: 180 tablet, Rfl: 3 .  furosemide (LASIX) 40 MG tablet, take 1 tablet by mouth once daily, Disp: 90 tablet, Rfl: 3 .  insulin glargine (LANTUS) 100 UNIT/ML injection, Inject 50 Units into the skin 2 (two) times daily. , Disp: , Rfl:  .  Liraglutide (VICTOZA) 18 MG/3ML SOPN, Inject 1.2 mg into the skin daily., Disp: , Rfl:  .  simvastatin (ZOCOR) 40 MG tablet, Take 40 mg by mouth every evening., Disp: , Rfl:  .  tadalafil (CIALIS) 10 MG tablet, Take 1 tablet (10 mg total) by mouth daily as needed for erectile dysfunction., Disp: 10 tablet, Rfl: 3 .  valsartan (DIOVAN) 320 MG tablet, take 1 tablet by mouth once daily, Disp: 90 tablet, Rfl: 3 .  vitamin B-12 (CYANOCOBALAMIN) 250 MCG tablet, Take 250 mcg by mouth daily., Disp: , Rfl:  .  vitamin C (ASCORBIC ACID) 500 MG tablet, Take 500 mg by mouth daily., Disp: , Rfl:       Review of Systems  Constitutional: Positive for unexpected weight change. Negative for fever, chills, activity change and appetite change.  HENT: Negative for congestion, dental problem, postnasal drip, rhinorrhea, sneezing, sore throat, trouble swallowing and voice change.   Eyes: Negative for visual disturbance.  Respiratory: Positive for cough and shortness of breath. Negative for choking.   Cardiovascular: Positive for leg swelling. Negative for chest pain.  Gastrointestinal: Negative for nausea, vomiting and abdominal pain.  Genitourinary: Negative for difficulty urinating.  Musculoskeletal: Negative for arthralgias.  Skin: Negative for rash.  Psychiatric/Behavioral: Negative for behavioral problems and confusion.       Objective:   Physical Exam  Constitutional: He is oriented to person, place,  and time. He appears well-developed and well-nourished. No distress.  Morbidly obese male  HENT:  Head: Normocephalic and atraumatic.  Right Ear: External ear normal.  Left Ear: External ear normal.  Mouth/Throat: Oropharynx is clear and moist. No oropharyngeal exudate.  Mallampati class IV  Eyes: Conjunctivae and EOM are normal. Pupils are equal, round, and reactive to light. Right eye exhibits no discharge. Left eye exhibits no discharge. No scleral icterus.  Neck: Normal range of motion. Neck supple. No JVD present. No tracheal deviation present. No thyromegaly present.  Cardiovascular: Normal rate, regular rhythm and intact distal pulses.  Exam reveals no gallop and no friction rub.   No murmur heard. Pulmonary/Chest: Effort normal. No respiratory distress. He has wheezes. He has no rales. He exhibits no tenderness.  Left infraclavicular pacemaker present Faint expiratory wheeze present-? Forced  Abdominal: Soft. Bowel sounds are normal. He exhibits no distension and no mass. There is no tenderness. There is no rebound and no guarding.  Significant visible obesity  Musculoskeletal:  Normal range of motion. He exhibits no edema or tenderness.  Lymphadenopathy:    He has no cervical adenopathy.  Neurological: He is alert and oriented to person, place, and time. He has normal reflexes. No cranial nerve deficit. Coordination normal.  Skin: Skin is warm and dry. No rash noted. He is not diaphoretic. No erythema. No pallor.  Discrete multiple dark macular lesions across the chest  Psychiatric: He has a normal mood and affect. His behavior is normal. Judgment and thought content normal.  Nursing note and vitals reviewed.   Filed Vitals:   05/22/15 1536  BP: 120/82  Pulse: 91  Height: 5\' 10"  (1.778 m)  Weight: 385 lb 12.8 oz (174.998 kg)  SpO2: 94%         Assessment & Plan:     ICD-9-CM ICD-10-CM   1. Dyspnea and respiratory abnormality 786.09 R06.00 CT Chest W Contrast     R06.89 Pulmonary Function Test  2. Wheezing 786.07 R06.2 CT Chest W Contrast     Pulmonary Function Test  3. History of sarcoidosis V12.29 Z86.2 CT Chest W Contrast     Pulmonary Function Test  4. History of asthma V12.69 Z87.09 CT Chest W Contrast     Pulmonary Function Test    It is very likely that his pulmonary sarcoidosis is in remission which is pretty classic of the disease particularly if it is early stage. I base this on his history and clear chest x-ray. On the other hand it is possible that he has asthma now. However exhaled nitric oxide has borderline and indeterminate. Given his African-American ethnicity, previous sarcoid history and wheezing improved by albuterol as he'll start empiric treatment for asthma with inhaled corticosteroids. We will reassess his asthma control questionnaire and exhaled nitric oxide at follow-up to see if he is improving. We will also get a pulmonary function test to ensure his flow volume loop and thereby by) vocal cord function is normal.  If asthma is thought to be definite and we will continue this line of management in the future then he will need to come off his nonspecific beta blockers.  We'll also get a CT chest with contrast and shows sarcoidosis as needed in remission.   Dr. Kalman Shan, M.D., [facility].C.P Pulmonary and Critical Care Medicine Staff Physician [facility] Pager: [phone], If no answer or between  15:00h - 7:00h: call [phone]  05/22/2015 4:26 PM

## 2015-05-22 NOTE — Patient Instructions (Addendum)
ICD-9-CM ICD-10-CM   1. Dyspnea and respiratory abnormality 786.09 R06.00     R06.89   2. Wheezing 786.07 R06.2   3. History of sarcoidosis V12.29 Z86.2   4. History of asthma V12.69 Z87.09    You might have asthma We need to rule out sarcoidosis  Plan  - start pulmicort 2 puff twice daily  - use albuterol only as needed - do CT chest with contrast - to establish if you have sarcoid  - do full PFT  Followup   - do tests antyime next few to several weeks  - return after tests n 4-6 weeks to see me or my APP

## 2015-05-28 ENCOUNTER — Telehealth: Payer: Self-pay | Admitting: Internal Medicine

## 2015-05-28 NOTE — Telephone Encounter (Signed)
Spoke with the pt  He reports that he was not able to get pulmicort inhaler b/c ins does not cover  I called [facility] and spoke with Kathlene November, pharmacist  He states med is non formulary and needs PA  Called [phone] and initiated PA Pt ID number [id number]  PA will be sent for clinical review LMTCB for pt to let him know we are working on this

## 2015-05-29 NOTE — Telephone Encounter (Signed)
Spoke with pt. Advised him that we are working on this and will keep him update. He verbalized understanding.

## 2015-06-02 ENCOUNTER — Other Ambulatory Visit: Payer: Self-pay | Admitting: *Deleted

## 2015-06-03 NOTE — Telephone Encounter (Signed)
Called to check status of PA, PA for pulmicort has been denied.    MR please advise on alternative, or if you'd like to appeal this decision.  Thanks!

## 2015-06-04 ENCOUNTER — Ambulatory Visit (INDEPENDENT_AMBULATORY_CARE_PROVIDER_SITE_OTHER)
Admission: RE | Admit: 2015-06-04 | Discharge: 2015-06-04 | Disposition: A | Discharge status: Home / Self Care | Payer: Medicare Other | Source: Ambulatory Visit | Attending: Internal Medicine | Admitting: Internal Medicine

## 2015-06-04 ENCOUNTER — Encounter: Payer: Self-pay | Admitting: Cardiology

## 2015-06-04 ENCOUNTER — Ambulatory Visit (INDEPENDENT_AMBULATORY_CARE_PROVIDER_SITE_OTHER): Payer: Medicare Other | Admitting: Cardiology

## 2015-06-04 VITALS — BP 118/82 | HR 84 | Ht 70.0 in | Wt 383.4 lb

## 2015-06-04 DIAGNOSIS — I429 Cardiomyopathy, unspecified: Secondary | ICD-10-CM

## 2015-06-04 DIAGNOSIS — Z8709 Personal history of other diseases of the respiratory system: Secondary | ICD-10-CM

## 2015-06-04 DIAGNOSIS — R0602 Shortness of breath: Secondary | ICD-10-CM

## 2015-06-04 DIAGNOSIS — E785 Hyperlipidemia, unspecified: Secondary | ICD-10-CM

## 2015-06-04 DIAGNOSIS — I10 Essential (primary) hypertension: Secondary | ICD-10-CM | POA: Diagnosis not present

## 2015-06-04 DIAGNOSIS — I5022 Chronic systolic (congestive) heart failure: Secondary | ICD-10-CM

## 2015-06-04 DIAGNOSIS — I77810 Thoracic aortic ectasia: Secondary | ICD-10-CM

## 2015-06-04 DIAGNOSIS — I428 Other cardiomyopathies: Secondary | ICD-10-CM

## 2015-06-04 DIAGNOSIS — R0600 Dyspnea, unspecified: Secondary | ICD-10-CM | POA: Diagnosis not present

## 2015-06-04 DIAGNOSIS — R062 Wheezing: Secondary | ICD-10-CM

## 2015-06-04 DIAGNOSIS — Z862 Personal history of diseases of the blood and blood-forming organs and certain disorders involving the immune mechanism: Secondary | ICD-10-CM

## 2015-06-04 DIAGNOSIS — I442 Atrioventricular block, complete: Secondary | ICD-10-CM | POA: Diagnosis not present

## 2015-06-04 DIAGNOSIS — R0689 Other abnormalities of breathing: Secondary | ICD-10-CM

## 2015-06-04 DIAGNOSIS — I471 Supraventricular tachycardia: Secondary | ICD-10-CM

## 2015-06-04 LAB — BRAIN NATRIURETIC PEPTIDE: BRAIN NATRIURETIC PEPTIDE: 135 pg/mL — AB (ref <100)

## 2015-06-04 MED ORDER — FLUTICASONE PROPIONATE HFA 220 MCG/ACT IN AERO: 2.0000 | INHALATION_SPRAY | Freq: Two times a day (BID) | RESPIRATORY_TRACT | Status: DC

## 2015-06-04 MED ORDER — IOPAMIDOL (ISOVUE-300) INJECTION 61%
80.0000 mL | Freq: Once | INTRAVENOUS | Status: AC | PRN
  Administered 2015-06-04: 80 mL via INTRAVENOUS

## 2015-06-04 NOTE — Telephone Encounter (Signed)
MR, Flovent 220 and incruse are covered alternatives.  Please advise on how many puff and frequency for Flovent.  Thank you.

## 2015-06-04 NOTE — Progress Notes (Signed)
Cardiology Office Note    Date:  06/04/2015   ID:  [name], DOB October 10, 1955, MRN [id number]  PCP:  Georgann Housekeeper, MD  Cardiologist:  [name], MD   Chief Complaint  Patient presents with  . Congestive Heart Failure  . Hypertension    History of Present Illness:  [name] is a 60 y.o. male with a history of nonischemic DCM (normal coronary arteries by cath 1996), Sarcoidosis, chronic systolic CHF, HTN, CHB s/p dual chamber AICD, and SVT s/p ablation. He is doing well. He denies any  palpitations, dizziness or syncope . He has chronic LE edema which he says is very well controlled on diuretics. He has chronic DOE which has worsened recently and he saw Dr. Marchelle Gearing and had a chest CT today.  He has been having increased wheezing with his SOB.  The DOE is now interfering with his daily activities.  He also has been having some pressure in his chest but he says not enough to call me about it.     Past Medical History  Diagnosis Date  . DM (diabetes mellitus) (HCC)   . Nonischemic cardiomyopathy (HCC)     EF of 15%  . Sarcoid (HCC)     "lung"-no problems  . Obesity   . CHF (congestive heart failure) (HCC)   . Back pain   . Automatic implantable cardioverter-defibrillator in situ   . Sleep apnea 06-28-12    no cpap used-unable to tolerate  . HTN (hypertension)   . Complete heart block [facility]     s/p PPM 1997 with upgrade to AICD 2011  . Hyperlipidemia   . Asthma   . Glaucoma   . SVT (supraventricular tachycardia) (HCC)     s/p ablation  . Depression     Past Surgical History  Procedure Laterality Date  . Pacemaker insertion    . Tonsillectomy    . Hemorrhoid surgery      early 20's  . Colonoscopy with propofol N/A 07/17/2012    Procedure: COLONOSCOPY WITH PROPOFOL;  Surgeon: Charolett Bumpers, MD;  Location: [facility];  Service: Endoscopy;  Laterality: N/A;    Current Medications: Outpatient Prescriptions Prior to Visit  Medication Sig  Dispense Refill  . acetaminophen (TYLENOL) 500 MG tablet Take 500 mg by mouth every 6 (six) hours as needed for pain.    Marland Kitchen albuterol (PROVENTIL HFA;VENTOLIN HFA) 108 (90 BASE) MCG/ACT inhaler Inhale 2 puffs into the lungs every 6 (six) hours as needed. For shortness of breath    . aspirin 81 MG tablet Take 81 mg by mouth daily.    . carvedilol (COREG) 25 MG tablet take 1 tablet by mouth twice a day 180 tablet 3  . furosemide (LASIX) 40 MG tablet take 1 tablet by mouth once daily 90 tablet 3  . insulin glargine (LANTUS) 100 UNIT/ML injection Inject 50 Units into the skin 2 (two) times daily.     . Liraglutide (VICTOZA) 18 MG/3ML SOPN Inject 1.2 mg into the skin daily.    . simvastatin (ZOCOR) 40 MG tablet Take 40 mg by mouth every evening.    . tadalafil (CIALIS) 10 MG tablet Take 1 tablet (10 mg total) by mouth daily as needed for erectile dysfunction. 10 tablet 3  . valsartan (DIOVAN) 320 MG tablet take 1 tablet by mouth once daily 90 tablet 3  . vitamin B-12 (CYANOCOBALAMIN) 250 MCG tablet Take 250 mcg by mouth daily.    . vitamin C (ASCORBIC ACID) 500  MG tablet Take 500 mg by mouth daily.    . budesonide (PULMICORT FLEXHALER) 180 MCG/ACT inhaler Inhale 2 puffs into the lungs 2 (two) times daily. (Patient not taking: Reported on 06/04/2015) 1 Inhaler 3   No facility-administered medications prior to visit.     Allergies:   Penicillins   Social History   Social History  . Marital Status: Married    Spouse Name: N/A  . Number of Children: N/A  . Years of Education: N/A   Social History Main Topics  . Smoking status: Never Smoker   . Smokeless tobacco: None  . Alcohol Use: Yes     Comment: occasionally  . Drug Use: No  . Sexual Activity: Yes   Other Topics Concern  . None   Social History Narrative     Family History:  The patient's family history includes Alzheimer's disease in his mother; CVA in his father; Diabetes in his brother, mother, and sister; Epilepsy in his sister;  Heart failure in his father.   ROS:   Please see the history of present illness.    ROS All other systems reviewed and are negative.   PHYSICAL EXAM:   VS:  BP 118/82 mmHg  Pulse 84  Ht 5\' 10"  (1.778 m)  Wt 383 lb 6.4 oz (173.909 kg)  BMI 55.01 kg/m2   GEN: Well nourished, well developed, in no acute distress HEENT: normal Neck: no JVD, carotid bruits, or masses Cardiac: RRR; no murmurs, rubs, or gallops,no edema.  Intact distal pulses bilaterally.  Respiratory:  clear to auscultation bilaterally, normal work of breathing GI: soft, nontender, nondistended, + BS MS: no deformity or atrophy Skin: warm and dry, no rash Neuro:  Alert and Oriented x 3, Strength and sensation are intact Psych: euthymic mood, full affect  Wt Readings from Last 3 Encounters:  06/04/15 383 lb 6.4 oz (173.909 kg)  05/22/15 385 lb 12.8 oz (174.998 kg)  04/28/15 384 lb 3.2 oz (174.272 kg)      Studies/Labs Reviewed:   EKG:  EKG is not ordered today.    Recent Labs: 05/12/2015: BUN 13; Creat 1.04; Potassium 4.3; Sodium 138   Lipid Panel    Component Value Date/Time   CHOL  09/22/2009 0330    134        ATP III CLASSIFICATION:  <200     mg/dL   Desirable  161-096  mg/dL   Borderline High  >=045    mg/dL   High          TRIG 409 09/22/2009 0330   HDL 47 09/22/2009 0330   CHOLHDL 2.9 09/22/2009 0330   VLDL 25 09/22/2009 0330   LDLCALC  09/22/2009 0330    62        Total Cholesterol/HDL:CHD Risk Coronary Heart Disease Risk Table                     Men   Women  1/2 Average Risk   3.4   3.3  Average Risk       5.0   4.4  2 X Average Risk   9.6   7.1  3 X Average Risk  23.4   11.0        Use the calculated Patient Ratio above and the CHD Risk Table to determine the patient's CHD Risk.        ATP III CLASSIFICATION (LDL):  <100     mg/dL   Optimal  811-914  mg/dL  Near or Above                    Optimal  130-159  mg/dL   Borderline  161-096  mg/dL   High  >045     mg/dL   Very High     Additional studies/ records that were reviewed today include:  none    ASSESSMENT:    1. Chronic systolic heart failure (HCC)   2. Essential hypertension   3. Nonischemic cardiomyopathy (HCC)   4. Complete heart block (HCC)   5. SVT (supraventricular tachycardia) (HCC)   6. Dilated aortic root (HCC)   7. Hyperlipidemia      PLAN:  In order of problems listed above:  1. Chronic systolic CHF Class 2b- he appears euvolemic on exam but has had worsening SOB that limits his activity.  He just had a chest CT done in workup with Dr. Marchelle Gearing. He has wheezing on exam so I suspect this is more pulmonary in etiology.   I will check a BNP to rule out volume overload although he appears euvolemic on exam.  He has not had any ischemic w/u since 2011 so I will get a 2 day Lexiscan myoview to rule out ischemia.  He also has a dual chamber ICD and wide QRS.  Dr. Ladona Ridgel was planning on waiting to upgrade to BiV when he reached ERI but if no etiology of worsening SOB can be found may need to consider this sooner. Continue BB/ARB/diuretics.   2. HTN - BP well controlled on current medical regimen. Continue BB/ARB. 3. NIDCM EF 15-20% by echo 2015 s/p AICD 4. CHB s/p dual chamber AICD- followed by Dr. Ladona Ridgel. Plan is to upgrade to BiV AICD once he reaches ERI 5. SVT s/p ablation 6. Dilated aortic root - he had a chest CT today that is pending     Medication Adjustments/Labs and Tests Ordered: Current medicines are reviewed at length with the patient today.  Concerns regarding medicines are outlined above.  Medication changes, Labs and Tests ordered today are listed in the Patient Instructions below.  There are no Patient Instructions on file for this visit.   Signed, [name], MD  06/04/2015 10:59 AM    [facility] [address] [place], [place], [place]  [address] Phone: [phone]; Fax: [phone]

## 2015-06-04 NOTE — Telephone Encounter (Signed)
Spoke with pt. He is aware of MR's recommendation. Rx has been sent in. Nothing further was needed. 

## 2015-06-04 NOTE — Patient Instructions (Signed)
Medication Instructions:  Your physician recommends that you continue on your current medications as directed. Please refer to the Current Medication list given to you today.   Labwork: TODAY: BNP  Testing/Procedures: Your physician has requested that you have a lexiscan myoview. For further information please visit [email or web address] Please follow instruction sheet, as given.  Follow-Up: Your physician wants you to follow-up in: 6 months with Dr. [name]. You will receive a reminder letter in the mail two months in advance. If you don't receive a letter, please call our office to schedule the follow-up appointment.   Any Other Special Instructions Will Be Listed Below (If Applicable).     If you need a refill on your cardiac medications before your next appointment, please call your pharmacy.

## 2015-06-04 NOTE — Telephone Encounter (Signed)
flovent 2 puff bid

## 2015-06-04 NOTE — Telephone Encounter (Signed)
Ok try arunity, Conservation officer, nature. ; whichever insurance will approve. Go for highest dose

## 2015-06-05 ENCOUNTER — Telehealth: Payer: Self-pay | Admitting: Internal Medicine

## 2015-06-05 NOTE — Telephone Encounter (Signed)
Scattered nodule and findings of old sarcoid. HE can dw TP at [facility] in june   Ct Chest W Contrast  06/04/2015  CLINICAL DATA:  History of sarcoidosis.  Dyspnea and wheezing. EXAM: CT CHEST WITH CONTRAST TECHNIQUE: Multidetector CT imaging of the chest was performed during intravenous contrast administration. CONTRAST:  68mL ISOVUE-300 IOPAMIDOL (ISOVUE-300) INJECTION 61% COMPARISON:  04/29/2014 chest radiograph.  No prior chest CT. FINDINGS: Mediastinum/Nodes: Mild cardiomegaly. No pericardial fluid/thickening. Three lead left subclavian ICD is noted with lead tips in the right ventricle and right atrium. Great vessels are normal in course and caliber. No central pulmonary emboli. Normal visualized thyroid. Normal esophagus. No axillary adenopathy. Mildly enlarged 1.1 cm right subcarinal node (series 2/ image 72). Mild bilateral hilar adenopathy measuring up to 1.2 cm on the right (series 2/image 58) and 1.0 cm on the left (series 2/ image 65). Lungs/Pleura: No pneumothorax. No pleural effusion. No acute consolidative airspace disease. There is mild irregular thickening of the right major fissure (pseudoplaque appearance). There are a few scattered pulmonary nodules in the right lung, a few of which are subpleural in location, for example a 0.5 cm subpleural right upper lobe pulmonary nodule (series 3/ image 26) and a 0.5 cm posterior right lower lobe subpleural pulmonary nodule (series 3/ image 127). No significant regions of peribronchovascular or interlobular septal thickening. Parenchymal bands in the lingula and anterior left lower lobe. No lung masses. No significant regions of traction bronchiectasis or frank honeycombing. Upper abdomen: Unremarkable. Musculoskeletal: No aggressive appearing focal osseous lesions. Mild degenerative changes in the thoracic spine. IMPRESSION: 1. Mild irregular thickening of the right major fissure (pseudoplaque appearance). Scattered pulmonary nodules in the right lung, some  of which are subpleural, measuring up to 0.5 cm. Parenchymal bands in the lingula and left lower lobe. These nonspecific findings could represent mild pulmonary manifestations of sarcoidosis. Otherwise no evidence of interstitial lung disease. No follow-up needed for the pulmonary nodules if patient is low-risk (and has no known or suspected primary neoplasm). Non-contrast chest CT can be considered in 12 months if patient is high-risk. This recommendation follows the consensus statement: Guidelines for Management of Incidental Pulmonary Nodules Detected on CT Images:From the Fleischner Society 2017; published online before print (10.1148/radiol.6384536468). 2. Mild subcarinal and bilateral hilar lymphadenopathy, consistent with sarcoidosis. 3. Mild cardiomegaly. Electronically Signed   By: Delbert Phenix M.D.   On: 06/04/2015 13:59

## 2015-06-15 ENCOUNTER — Telehealth (HOSPITAL_COMMUNITY): Payer: Self-pay | Admitting: *Deleted

## 2015-06-15 NOTE — Telephone Encounter (Signed)
Patient given detailed instructions per Myocardial Perfusion Study Information Sheet for the test on 06/17/15 at 1245. Patient notified to arrive 15 minutes early and that it is imperative to arrive on time for appointment to keep from having the test rescheduled.  If you need to cancel or reschedule your appointment, please call the office within 24 hours of your appointment. Failure to do so may result in a cancellation of your appointment, and a $50 no show fee. Patient verbalized understanding.[name], [name]

## 2015-06-17 ENCOUNTER — Ambulatory Visit (HOSPITAL_COMMUNITY): Payer: Medicare Other | Attending: Cardiology

## 2015-06-17 DIAGNOSIS — R079 Chest pain, unspecified: Secondary | ICD-10-CM | POA: Insufficient documentation

## 2015-06-17 DIAGNOSIS — R9439 Abnormal result of other cardiovascular function study: Secondary | ICD-10-CM | POA: Diagnosis not present

## 2015-06-17 DIAGNOSIS — E119 Type 2 diabetes mellitus without complications: Secondary | ICD-10-CM | POA: Diagnosis not present

## 2015-06-17 DIAGNOSIS — R0609 Other forms of dyspnea: Secondary | ICD-10-CM | POA: Diagnosis not present

## 2015-06-17 DIAGNOSIS — R0602 Shortness of breath: Secondary | ICD-10-CM

## 2015-06-17 DIAGNOSIS — I119 Hypertensive heart disease without heart failure: Secondary | ICD-10-CM | POA: Diagnosis not present

## 2015-06-17 MED ORDER — REGADENOSON 0.4 MG/5ML IV SOLN
0.4000 mg | Freq: Once | INTRAVENOUS | Status: AC
  Administered 2015-06-17: 0.4 mg via INTRAVENOUS

## 2015-06-17 MED ORDER — AMINOPHYLLINE 25 MG/ML IV SOLN
75.0000 mg | Freq: Once | INTRAVENOUS | Status: AC
  Administered 2015-06-17: 75 mg via INTRAVENOUS

## 2015-06-17 MED ORDER — TECHNETIUM TC 99M TETROFOSMIN IV KIT
33.0000 | PACK | Freq: Once | INTRAVENOUS | Status: AC | PRN
  Administered 2015-06-17: 33 via INTRAVENOUS
  Filled 2015-06-17: qty 33

## 2015-06-18 ENCOUNTER — Ambulatory Visit (HOSPITAL_COMMUNITY): Payer: Medicare Other | Attending: Cardiovascular Disease

## 2015-06-18 LAB — MYOCARDIAL PERFUSION IMAGING
Peak HR: 97 {beats}/min
Rest HR: 85 {beats}/min

## 2015-06-18 MED ORDER — TECHNETIUM TC 99M TETROFOSMIN IV KIT
32.9000 | PACK | Freq: Once | INTRAVENOUS | Status: AC | PRN
  Administered 2015-06-18: 32.9 via INTRAVENOUS
  Filled 2015-06-18: qty 33

## 2015-06-23 ENCOUNTER — Encounter: Payer: Self-pay | Admitting: Adult Health

## 2015-06-23 ENCOUNTER — Ambulatory Visit (HOSPITAL_COMMUNITY)
Admission: RE | Admit: 2015-06-23 | Discharge: 2015-06-23 | Disposition: A | Discharge status: Home / Self Care | Payer: Medicare Other | Source: Ambulatory Visit | Attending: Internal Medicine | Admitting: Internal Medicine

## 2015-06-23 ENCOUNTER — Ambulatory Visit (INDEPENDENT_AMBULATORY_CARE_PROVIDER_SITE_OTHER): Payer: Medicare Other | Admitting: Adult Health

## 2015-06-23 ENCOUNTER — Telehealth: Payer: Self-pay | Admitting: Cardiology

## 2015-06-23 VITALS — BP 126/76 | HR 78 | Temp 97.8°F | Ht 71.0 in | Wt 379.0 lb

## 2015-06-23 DIAGNOSIS — D869 Sarcoidosis, unspecified: Secondary | ICD-10-CM

## 2015-06-23 DIAGNOSIS — Z8709 Personal history of other diseases of the respiratory system: Secondary | ICD-10-CM

## 2015-06-23 DIAGNOSIS — R062 Wheezing: Secondary | ICD-10-CM | POA: Diagnosis not present

## 2015-06-23 DIAGNOSIS — R0600 Dyspnea, unspecified: Secondary | ICD-10-CM

## 2015-06-23 DIAGNOSIS — Z862 Personal history of diseases of the blood and blood-forming organs and certain disorders involving the immune mechanism: Secondary | ICD-10-CM

## 2015-06-23 DIAGNOSIS — R0689 Other abnormalities of breathing: Secondary | ICD-10-CM | POA: Insufficient documentation

## 2015-06-23 LAB — PULMONARY FUNCTION TEST
DL/VA % pred: 130 %
DL/VA: 6.02 ml/min/mmHg/L
DLCO unc % pred: 70 %
DLCO unc: 22.85 ml/min/mmHg
FEF 25-75 POST: 1.55 L/s
FEF 25-75 Pre: 2.41 L/sec
FEF2575-%CHANGE-POST: -35 %
FEF2575-%PRED-POST: 53 %
FEF2575-%PRED-PRE: 82 %
FEV1-%CHANGE-POST: -8 %
FEV1-%Pred-Post: 57 %
FEV1-%Pred-Pre: 62 %
FEV1-Post: 1.8 L
FEV1-Pre: 1.96 L
FEV1FVC-%Change-Post: 2 %
FEV1FVC-%PRED-PRE: 106 %
FEV6-%CHANGE-POST: -10 %
FEV6-%PRED-POST: 54 %
FEV6-%Pred-Pre: 60 %
FEV6-POST: 2.1 L
FEV6-Pre: 2.35 L
FEV6FVC-%PRED-POST: 104 %
FEV6FVC-%Pred-Pre: 104 %
FVC-%CHANGE-POST: -10 %
FVC-%PRED-POST: 52 %
FVC-%Pred-Pre: 58 %
FVC-Post: 2.1 L
FVC-Pre: 2.35 L
POST FEV1/FVC RATIO: 86 %
PRE FEV1/FVC RATIO: 83 %
PRE FEV6/FVC RATIO: 100 %
Post FEV6/FVC ratio: 100 %
RV % pred: 74 %
RV: 1.66 L
TLC % pred: 63 %
TLC: 4.42 L

## 2015-06-23 MED ORDER — BUDESONIDE 180 MCG/ACT IN AEPB: 2.0000 | INHALATION_SPRAY | Freq: Two times a day (BID) | RESPIRATORY_TRACT | Status: DC

## 2015-06-23 MED ORDER — ALBUTEROL SULFATE (2.5 MG/3ML) 0.083% IN NEBU
2.5000 mg | INHALATION_SOLUTION | Freq: Once | RESPIRATORY_TRACT | Status: AC
  Administered 2015-06-23: 2.5 mg via RESPIRATORY_TRACT

## 2015-06-23 NOTE — Assessment & Plan Note (Signed)
Sarcoid -CT does show possible sarcoid changes with scattered pulmonary nodules and mild adenopathy  Plan  Cont on current regimen .

## 2015-06-23 NOTE — Assessment & Plan Note (Signed)
Dyspnea is most likely multifactoral with ischemic CM w/ EF at 15-205, morbid obesity , chronic pain, deconditioning and restrictive lung disease from possible sarcoid and obesity.  Cont on pulmicort, weight loss  .

## 2015-06-23 NOTE — Telephone Encounter (Signed)
New message     Returning a call from the nurse

## 2015-06-23 NOTE — Assessment & Plan Note (Signed)
Possible asthma component  PFT does not show significant airflow obstruction and no reversibility -does have moderate restrictive pattern and mild diffucing defect ? Obesity related. No desats on RA.  He is clinically improved with pulmicort , would cont for now  Check CT chest in 1 year and repeat spirometry in 1 year and As needed

## 2015-06-23 NOTE — Patient Instructions (Signed)
Continue on Pulmicort 2 puffs Twice daily  , rinse after use.  follow up Dr. Marchelle Gearing in 3 months and As needed

## 2015-06-23 NOTE — Progress Notes (Signed)
Subjective:    Patient ID: [name], male    DOB: Dec 30, 1955, 60 y.o.   MRN: [id number]  HPI 60 year old male never smoker for pulmonary consult 05/22/2015 for sarcoidosis and asthma The nonischemic cardiomyopathy followed by cardiology Has OSA on CPAP   06/23/2015 follow up Returns for a one-month follow-up. Patient was seen last visit for pulmonary consult for possible sarcoidosis and asthma. Patient was being seen in [facility] by pulmonologist. Diagnosed with sarcoid years ago. He has been on and off of prednisone several times in the past.  Last ov  , FENO was 29. Patient was set up for a CT chest that showed mild irregular thickening along the right major fissure, scattered pulmonary nodules in the right lung. Parenchymal bands in the lingula and left lower lobe. These could represent nonspecific findings associated with sarcoid. No other evidence of interstitial lung disease was noted. Mild subcarinal and bilateral hilar adenopathy. Pulmonary function test performed today showed an FEV1 at 62%, ratio 83, FVC 58%, no significant bronchodilator response, total lung capacity 63%, DLCO 70% Patient was started on Pulmicort last visit. Patient feels that this has improved his shortness of breath and cough.   Past Medical History  Diagnosis Date  . DM (diabetes mellitus) (HCC)   . Nonischemic cardiomyopathy (HCC)     EF of 15%  . Sarcoid (HCC)     "lung"-no problems  . Obesity   . CHF (congestive heart failure) (HCC)   . Back pain   . Automatic implantable cardioverter-defibrillator in situ   . Sleep apnea 06-28-12    no cpap used-unable to tolerate  . HTN (hypertension)   . Complete heart block [facility])     s/p PPM 1997 with upgrade to AICD 2011  . Hyperlipidemia   . Asthma   . Glaucoma   . SVT (supraventricular tachycardia) (HCC)     s/p ablation  . Depression    Current Outpatient Prescriptions on File Prior to Visit  Medication Sig Dispense Refill  . acetaminophen  (TYLENOL) 500 MG tablet Take 500 mg by mouth every 6 (six) hours as needed for pain.    Marland Kitchen albuterol (PROVENTIL HFA;VENTOLIN HFA) 108 (90 BASE) MCG/ACT inhaler Inhale 2 puffs into the lungs every 6 (six) hours as needed. For shortness of breath    . aspirin 81 MG tablet Take 81 mg by mouth daily.    . carvedilol (COREG) 25 MG tablet take 1 tablet by mouth twice a day 180 tablet 3  . fluticasone (FLOVENT HFA) 220 MCG/ACT inhaler Inhale 2 puffs into the lungs 2 (two) times daily. 1 Inhaler 5  . furosemide (LASIX) 40 MG tablet take 1 tablet by mouth once daily 90 tablet 3  . insulin glargine (LANTUS) 100 UNIT/ML injection Inject 50 Units into the skin 2 (two) times daily.     . IRON PO Take by mouth. OTC-Patient unsure of dosage    . Liraglutide (VICTOZA) 18 MG/3ML SOPN Inject 1.2 mg into the skin daily.    . simvastatin (ZOCOR) 40 MG tablet Take 40 mg by mouth every evening.    . tadalafil (CIALIS) 10 MG tablet Take 1 tablet (10 mg total) by mouth daily as needed for erectile dysfunction. 10 tablet 3  . valsartan (DIOVAN) 320 MG tablet take 1 tablet by mouth once daily 90 tablet 3  . vitamin B-12 (CYANOCOBALAMIN) 250 MCG tablet Take 250 mcg by mouth daily.    . vitamin C (ASCORBIC ACID) 500 MG tablet Take  500 mg by mouth daily.     No current facility-administered medications on file prior to visit.     Review of Systems Constitutional:   No  weight loss, night sweats,  Fevers, chills,  +fatigue, or  lassitude.  HEENT:   No headaches,  Difficulty swallowing,  Tooth/dental problems, or  Sore throat,                No sneezing, itching, ear ache, nasal congestion, post nasal drip,   CV:  No chest pain,  Orthopnea, PND, swelling in lower extremities, anasarca, dizziness, palpitations, syncope.   GI  No heartburn, indigestion, abdominal pain, nausea, vomiting, diarrhea, change in bowel habits, loss of appetite, bloody stools.   Resp:  No chest wall deformity  Skin: no rash or lesions.  GU:  no dysuria, change in color of urine, no urgency or frequency.  No flank pain, no hematuria   MS:  No joint pain or swelling.  No decreased range of motion.  No back pain.  Psych:  No change in mood or affect. No depression or anxiety.  No memory loss.         Objective:   Physical Exam Filed Vitals:   06/23/15 1418  BP: 126/76  Pulse: 78  Temp: 97.8 F (36.6 C)  TempSrc: Oral  Height:  (1.803 m)  Weight: 379 lb (171.913 kg)  SpO2: 95%  Body mass index is 52.88 kg/(m^2).   GEN: A/Ox3; pleasant , NAD, obese   HEENT:  Justin/AT,  EACs-clear, TMs-wnl, NOSE-clear, THROAT-clear, no lesions, no postnasal drip or exudate noted. Class 3 MP airway   NECK:  Supple w/ fair ROM; no JVD; normal carotid impulses w/o bruits; no thyromegaly or nodules palpated; no lymphadenopathy.  RESP  Clear  P & A; w/o, wheezes/ rales/ or rhonchi.no accessory muscle use, no dullness to percussion  CARD:  RRR, no m/r/g  , tr  peripheral edema, pulses intact, no cyanosis or clubbing.  GI:   Soft & nt; nml bowel sounds; no organomegaly or masses detected.  Musco: Warm bil, no deformities or joint swelling noted.   Neuro: alert, no focal deficits noted.    Skin: Warm, no lesions or rashes  Arasely Akkerman NP-C  [facility]  06/23/2015

## 2015-06-23 NOTE — Telephone Encounter (Signed)
Informed patient of results and verbal understanding expressed.   Patient st his SOB is much better. He is currently taking Lasix 40 mg daily. He understands he will be called back only if Dr. [name] has further recommendations.

## 2015-06-23 NOTE — Addendum Note (Signed)
Addended by: Karalee Height on: 06/23/2015 03:15 PM   Modules accepted: Orders

## 2015-06-23 NOTE — Telephone Encounter (Signed)
-----   Message from Quintella Reichert, MD sent at 06/22/2015  4:46 PM EDT ----- Nuclear stress test showed severely reduced LVF unchanged from prior assessment and no ischemia. Please find out if patients SOB improved after increasing Lasix

## 2015-06-29 ENCOUNTER — Telehealth: Payer: Self-pay | Admitting: *Deleted

## 2015-06-29 DIAGNOSIS — R062 Wheezing: Secondary | ICD-10-CM

## 2015-06-29 NOTE — Telephone Encounter (Signed)
Initiated PA for Pulmicort Flexhaler thru C[facility]. Key: [id number]  Pt ID: [id number]  Submitted for review.  [facility] - [place], [place] - [address] [phone] (Phone) [phone] (Fax)

## 2015-06-29 NOTE — Telephone Encounter (Signed)
Pulmicort Flexhaler denied. No alternatives given.  PA # [id number]

## 2015-06-30 NOTE — Telephone Encounter (Signed)
MR please advise on alternative inhaler. Thanks!

## 2015-07-01 NOTE — Telephone Encounter (Signed)
lmtcb X1 for pt to make aware of medication changes- will send in new rx after speaking to pt.

## 2015-07-01 NOTE — Telephone Encounter (Signed)
Try flovent 2 puff bid

## 2015-07-02 MED ORDER — FLUTICASONE PROPIONATE HFA 110 MCG/ACT IN AERO: 2.0000 | INHALATION_SPRAY | Freq: Every day | RESPIRATORY_TRACT | Status: DC | PRN

## 2015-07-02 NOTE — Telephone Encounter (Signed)
Patient notified of Dr. Jane Canary recommendations. Rx for FLovent 110 HFA sent to pharmacy. Nothing further needed.

## 2015-07-02 NOTE — Telephone Encounter (Signed)
Pt returning call and can be reached @ [phone].Caren Griffins

## 2015-07-07 ENCOUNTER — Other Ambulatory Visit: Payer: Self-pay | Admitting: *Deleted

## 2015-07-07 MED ORDER — VALSARTAN 320 MG PO TABS: 320.0000 mg | ORAL_TABLET | Freq: Every day | ORAL | Status: DC

## 2015-07-28 ENCOUNTER — Encounter: Payer: Medicare Other | Admitting: *Deleted

## 2015-07-28 ENCOUNTER — Telehealth: Payer: Self-pay | Admitting: Cardiology

## 2015-07-28 NOTE — Telephone Encounter (Signed)
LMOVM reminding pt to send remote transmission.   

## 2015-07-31 ENCOUNTER — Encounter: Payer: Self-pay | Admitting: Cardiology

## 2015-08-03 ENCOUNTER — Ambulatory Visit (INDEPENDENT_AMBULATORY_CARE_PROVIDER_SITE_OTHER): Payer: Medicare Other | Admitting: *Deleted

## 2015-08-03 DIAGNOSIS — I428 Other cardiomyopathies: Secondary | ICD-10-CM

## 2015-08-03 DIAGNOSIS — I442 Atrioventricular block, complete: Secondary | ICD-10-CM

## 2015-08-03 DIAGNOSIS — I429 Cardiomyopathy, unspecified: Secondary | ICD-10-CM

## 2015-08-03 NOTE — Progress Notes (Signed)
Remote ICD transmission.   

## 2015-08-05 ENCOUNTER — Encounter: Payer: Self-pay | Admitting: Cardiology

## 2015-08-05 LAB — CUP PACEART REMOTE DEVICE CHECK
Battery Remaining Longevity: 32 mo
Battery Remaining Percentage: 35 %
Battery Voltage: 2.89 V
Brady Statistic AP VP Percent: 2.8 %
Brady Statistic AS VP Percent: 97 %
Brady Statistic RA Percent Paced: 2.5 %
Brady Statistic RV Percent Paced: 99 %
Date Time Interrogation Session: 20170724133233
HIGH POWER IMPEDANCE MEASURED VALUE: 43 Ohm
Implantable Lead Implant Date: 20111202
Implantable Lead Location: 753860
Implantable Lead Model: 5524
Implantable Lead Model: 7121
Lead Channel Impedance Value: 390 Ohm
Lead Channel Impedance Value: 390 Ohm
Lead Channel Pacing Threshold Amplitude: 0.75 V
Lead Channel Pacing Threshold Pulse Width: 0.5 ms
Lead Channel Pacing Threshold Pulse Width: 0.5 ms
Lead Channel Sensing Intrinsic Amplitude: 1.6 mV
Lead Channel Setting Pacing Amplitude: 2 V
Lead Channel Setting Pacing Pulse Width: 0.5 ms
MDC IDC LEAD IMPLANT DT: 19961213
MDC IDC LEAD LOCATION: 753859
MDC IDC MSMT LEADCHNL RA PACING THRESHOLD AMPLITUDE: 0.75 V
MDC IDC MSMT LEADCHNL RV SENSING INTR AMPL: 12 mV
MDC IDC SET LEADCHNL RA PACING AMPLITUDE: 2 V
MDC IDC SET LEADCHNL RV SENSING SENSITIVITY: 2 mV
MDC IDC STAT BRADY AP VS PERCENT: 1 %
MDC IDC STAT BRADY AS VS PERCENT: 1 %
Pulse Gen Serial Number: 629155

## 2015-09-23 ENCOUNTER — Ambulatory Visit (INDEPENDENT_AMBULATORY_CARE_PROVIDER_SITE_OTHER): Payer: Medicare Other | Admitting: Internal Medicine

## 2015-09-23 ENCOUNTER — Encounter: Payer: Self-pay | Admitting: Internal Medicine

## 2015-09-23 VITALS — BP 118/60 | HR 73 | Ht 71.0 in | Wt 382.0 lb

## 2015-09-23 DIAGNOSIS — Z23 Encounter for immunization: Secondary | ICD-10-CM

## 2015-09-23 DIAGNOSIS — J4530 Mild persistent asthma, uncomplicated: Secondary | ICD-10-CM | POA: Diagnosis not present

## 2015-09-23 DIAGNOSIS — Z862 Personal history of diseases of the blood and blood-forming organs and certain disorders involving the immune mechanism: Secondary | ICD-10-CM

## 2015-09-23 DIAGNOSIS — R062 Wheezing: Secondary | ICD-10-CM | POA: Diagnosis not present

## 2015-09-23 MED ORDER — FLUTICASONE PROPIONATE HFA 110 MCG/ACT IN AERO: 2.0000 | INHALATION_SPRAY | Freq: Two times a day (BID) | RESPIRATORY_TRACT | 11 refills | Status: DC

## 2015-09-23 NOTE — Patient Instructions (Addendum)
ICD-9-CM ICD-10-CM   1. History of sarcoidosis V12.29 Z86.2   2. Mild persistent asthma, uncomplicated 493.90 J45.30    Glad you are better with flovent Mild dry cough at night - try propping up your bed.   - If this does not work we can try nasal steroid or acid reflux treatement Flu shot 09/23/2015  Please talk to your PCP or cardiologist   - to see if they can change your coreg to a "beta 1 specific" beta-blocker; can be more lung protective  followup  6 months or sooner if needed

## 2015-09-23 NOTE — Progress Notes (Signed)
Subjective:     Patient ID: [name], male   DOB: [birth year], 60 y.o.   MRN: [id number]  HPI  PCP Georgann Housekeeper, MD  HPI  IOV 05/22/2015  Chief Complaint  Patient presents with  . Pulmonary Consult    Referred by [name]; Asthma/ Sarcoidosis; SOB; chest tightness.  non-productive cough.    60 year-old morbidly obese male with nonischemic cardiac myopathy and chronic back problems and sleep apnea followed by Dr. [name] referred for evaluation of possible sarcoidosis and asthma.  He migrated here in 10 from [place]. At that time he was in his 30s. He says he had like myalgias and was diagnosed with pulmonary sarcoidosis by Dr. [name] at [facility]. He remembers having a bronchoscopy and transbronchial biopsy. Further details unknown. Other than the fact he took prednisone for few to several months. He says over the next few years as sarcoidosis resolved according to follow at [facility]. And then some 15 years ago was diagnosed with nonischemic cardiomyopathy and his been disabled. Then some 20 years ago was diagnosed to have asthma by his primary care physician. He had asthma when away but in the last year or so it has come back. For the last 6 months he been using albuterol rescue at least several times daily. It is associated with wheeze. Symptoms of asthma include dyspnea on exertion and weakness at rest. Wheezing is made worse by cold temperature and relieved by albuterol. Moderate in intensity. There is no fever or sputum production.   Asthma control questionnaire.: He never wakes up in the middle of the night because of asthma symptoms. When he wakes up in the morning he perceives very mild symptoms. He feels very mildly limited because of asthma with his activities. He experiences a moderate amount of shortness of breath with exertion relieved by rest but also made worse by cold temperature and relieved by albuterol which he perceives is because of asthma. He  wheezes a moderate amount of the time and uses albuterol for rescue at least 3-4 puffs daily. 5. question as 1.6 and shows active symptoms.   FEno  - 29ppb and borderline - this is with patient taking albuterol as needed. He is not on inhaled steroids. He is noted to be a nonspecific beta blocker carvedilol. Visualization of 5 chest x-ray from 2007 all the way to 2016 show clear lung fields without any mediastinal adenopathy. He does not have any CT chest blood work review shows hemoglobin 12 g percent and normal creatinine.     06/23/2015 follow up Returns for a one-month follow-up. Patient was seen last visit for pulmonary consult for possible sarcoidosis and asthma. Patient was being seen in [facility] by pulmonologist. Diagnosed with sarcoid years ago. He has been on and off of prednisone several times in the past.  Last ov  , FENO was 29. Patient was set up for a CT chest that showed mild irregular thickening along the right major fissure, scattered pulmonary nodules in the right lung. Parenchymal bands in the lingula and left lower lobe. These could represent nonspecific findings associated with sarcoid. No other evidence of interstitial lung disease was noted. Mild subcarinal and bilateral hilar adenopathy. Pulmonary function test performed today showed an FEV1 at 62%, ratio 83, FVC 58%, no significant bronchodilator response, total lung capacity 63%, DLCO 70% Patient was started on Pulmicort last visit. Patient feels that this has improved his shortness of breath and cough.     OV 09/23/2015  Chief Complaint  Patient presents with  . Follow-up    Pt states his SOBhas improved since last OV in 06/2015. Pt c/o prod cough with clear mucus. Pt denies CP/tightness and f/c/s.    60-year-old morbidly obese male with a history of sarcoidosis in the past. Originally seen in May 2017 for wheezing. Evaluation showed that he had burned out sarcoidosis. Exhaled nitric oxide was slightly elevated at 29  ppb. Restart him on inhaled steroids. He is taking Flovent. This significantly improved his symptoms. Currently he tells me that he continues to maintain a wheeze free life. His only symptom is mild occasional cough when he lies down. At this point in time he does not want therapy for this. He thinks he can prop up his head that might help the cough. He will have flu shot today.      has a past medical history of Asthma; Automatic implantable cardioverter-defibrillator in situ; Back pain; CHF (congestive heart failure) (HCC); Complete heart block (HCC); Depression; DM (diabetes mellitus) (HCC); Glaucoma; HTN (hypertension); Hyperlipidemia; Nonischemic cardiomyopathy (HCC); Obesity; Sarcoid (HCC); Sleep apnea (06-28-12); and SVT (supraventricular tachycardia) (HCC).   reports that he has never smoked. He does not have any smokeless tobacco history on file.  Past Surgical History:  Procedure Laterality Date  . COLONOSCOPY WITH PROPOFOL N/A 07/17/2012   Procedure: COLONOSCOPY WITH PROPOFOL;  Surgeon: Charolett Bumpers, MD;  Location: [facility];  Service: Endoscopy;  Laterality: N/A;  . HEMORRHOID SURGERY     early 20's  . PACEMAKER INSERTION    . TONSILLECTOMY      Allergies  Allergen Reactions  . Penicillins Other (See Comments)    dizziness    Immunization History  Administered Date(s) Administered  . Influenza-Unspecified 11/11/2014    Family History  Problem Relation Age of Onset  . Diabetes Mother   . Alzheimer's disease Mother   . CVA Father   . Heart failure Father   . Epilepsy Sister   . Diabetes Brother   . Diabetes Sister      Current Outpatient Prescriptions:  .  acetaminophen (TYLENOL) 500 MG tablet, Take 500 mg by mouth every 6 (six) hours as needed for pain., Disp: , Rfl:  .  albuterol (PROVENTIL HFA;VENTOLIN HFA) 108 (90 BASE) MCG/ACT inhaler, Inhale 2 puffs into the lungs every 6 (six) hours as needed. For shortness of breath, Disp: , Rfl:  .  aspirin 81 MG  tablet, Take 81 mg by mouth daily., Disp: , Rfl:  .  carvedilol (COREG) 25 MG tablet, take 1 tablet by mouth twice a day, Disp: 180 tablet, Rfl: 3 .  fluticasone (FLOVENT HFA) 110 MCG/ACT inhaler, Inhale 2 puffs into the lungs daily as needed. (Patient taking differently: Inhale 2 puffs into the lungs 2 (two) times daily. ), Disp: 1 Inhaler, Rfl: 12 .  furosemide (LASIX) 40 MG tablet, take 1 tablet by mouth once daily, Disp: 90 tablet, Rfl: 3 .  insulin glargine (LANTUS) 100 UNIT/ML injection, Inject 50 Units into the skin 2 (two) times daily. , Disp: , Rfl:  .  IRON PO, Take by mouth. OTC-Patient unsure of dosage, Disp: , Rfl:  .  Liraglutide (VICTOZA) 18 MG/3ML SOPN, Inject 1.2 mg into the skin daily., Disp: , Rfl:  .  simvastatin (ZOCOR) 40 MG tablet, Take 40 mg by mouth every evening., Disp: , Rfl:  .  tadalafil (CIALIS) 10 MG tablet, Take 1 tablet (10 mg total) by mouth daily as needed for erectile dysfunction., Disp: 10  tablet, Rfl: 3 .  valsartan (DIOVAN) 320 MG tablet, Take 1 tablet (320 mg total) by mouth daily., Disp: 90 tablet, Rfl: 3 .  vitamin B-12 (CYANOCOBALAMIN) 250 MCG tablet, Take 250 mcg by mouth daily., Disp: , Rfl:  .  vitamin C (ASCORBIC ACID) 500 MG tablet, Take 500 mg by mouth daily., Disp: , Rfl:  .  budesonide (PULMICORT FLEXHALER) 180 MCG/ACT inhaler, Inhale 2 puffs into the lungs 2 (two) times daily. (Patient not taking: Reported on 09/23/2015), Disp: 1 Inhaler, Rfl: 5   Review of Systems     Objective:   Physical Exam  Constitutional: He is oriented to person, place, and time. He appears well-developed and well-nourished. No distress.  Morbidly obese  HENT:  Head: Normocephalic and atraumatic.  Right Ear: External ear normal.  Left Ear: External ear normal.  Mouth/Throat: Oropharynx is clear and moist. No oropharyngeal exudate.  Mallampati class III-IV  Eyes: Conjunctivae and EOM are normal. Pupils are equal, round, and reactive to light. Right eye exhibits no  discharge. Left eye exhibits no discharge. No scleral icterus.  Neck: Normal range of motion. Neck supple. No JVD present. No tracheal deviation present. No thyromegaly present.  Cardiovascular: Normal rate, regular rhythm and intact distal pulses.  Exam reveals no gallop and no friction rub.   No murmur heard. Pulmonary/Chest: Effort normal and breath sounds normal. No respiratory distress. He has no wheezes. He has no rales. He exhibits no tenderness.  Abdominal: Soft. Bowel sounds are normal. He exhibits no distension and no mass. There is no tenderness. There is no rebound and no guarding.  Musculoskeletal: Normal range of motion. He exhibits no edema or tenderness.  Lymphadenopathy:    He has no cervical adenopathy.  Neurological: He is alert and oriented to person, place, and time. He has normal reflexes. No cranial nerve deficit. Coordination normal.  Skin: Skin is warm and dry. No rash noted. He is not diaphoretic. No erythema. No pallor.  Psychiatric: He has a normal mood and affect. His behavior is normal. Judgment and thought content normal.  Nursing note and vitals reviewed.    Vitals:   09/23/15 1157  BP: 118/60  Pulse: 73  SpO2: 96%  Weight: (!) 382 lb (173.3 kg)  Height: 5\' 11"  (1.803 m)   Estimated body mass index is 53.28 kg/m as calculated from the following:   Height as of this encounter: 5\' 11"  (1.803 m).   Weight as of this encounter: 382 lb (173.3 kg).      Assessment:       ICD-9-CM ICD-10-CM   1. History of sarcoidosis V12.29 Z86.2   2. Mild persistent asthma, uncomplicated 493.90 J45.30        Plan:      Glad you are better with flovent Mild dry cough at night - try propping up your bed.   - If this does not work we can try nasal steroid or acid reflux treatement Flu shot 09/23/2015  Please talk to your PCP or cardiologist   - to see if they can change your coreg to a "beta 1 specific" beta-blocker; can be more lung protective  followup  6  months or sooner if needed      Dr. Kalman ShanMurali Alizon Schmeling, M.D., [facility] CenterF.C.C.P Pulmonary and Critical Care Medicine Staff Physician [facility]  Pulmonary and Critical Care Pager: [phone], If no answer or between  15:00h - 7:00h: call [phone]  09/23/2015 12:12 PM

## 2015-10-07 ENCOUNTER — Other Ambulatory Visit: Payer: Self-pay | Admitting: Internal Medicine

## 2015-11-02 ENCOUNTER — Encounter: Payer: Medicare Other | Admitting: *Deleted

## 2015-11-02 ENCOUNTER — Ambulatory Visit (INDEPENDENT_AMBULATORY_CARE_PROVIDER_SITE_OTHER): Payer: Medicare Other | Admitting: *Deleted

## 2015-11-02 ENCOUNTER — Telehealth: Payer: Self-pay | Admitting: Cardiology

## 2015-11-02 DIAGNOSIS — I428 Other cardiomyopathies: Secondary | ICD-10-CM | POA: Diagnosis not present

## 2015-11-02 NOTE — Telephone Encounter (Signed)
LMOVM reminding pt to send remote transmission.   

## 2015-11-04 ENCOUNTER — Telehealth: Payer: Self-pay | Admitting: Cardiology

## 2015-11-04 NOTE — Telephone Encounter (Signed)
Spoke w/ pt and he informed me that he is moving and that his phone will not be hooked up until Friday 11-06-2015. Pt knows how to send a remote transmission w/ his home monitor once his land line phone is hooked up.

## 2015-11-04 NOTE — Telephone Encounter (Signed)
New Message  Pt voiced he's needing help with Home Remote Defib Check.

## 2015-11-05 ENCOUNTER — Encounter: Payer: Self-pay | Admitting: Cardiology

## 2015-11-11 ENCOUNTER — Encounter: Payer: Self-pay | Admitting: Cardiology

## 2015-11-11 NOTE — Progress Notes (Signed)
Remote ICD transmission.   

## 2015-11-18 ENCOUNTER — Encounter: Payer: Self-pay | Admitting: Cardiology

## 2015-11-18 LAB — HM DIABETES EYE EXAM

## 2015-11-26 ENCOUNTER — Encounter: Payer: Self-pay | Admitting: Cardiology

## 2015-12-03 LAB — CUP PACEART REMOTE DEVICE CHECK
Battery Remaining Percentage: 33 %
Battery Voltage: 2.86 V
Brady Statistic AP VP Percent: 3 %
Brady Statistic AS VS Percent: 1 %
Brady Statistic RA Percent Paced: 2.7 %
Date Time Interrogation Session: 20171024011051
HIGH POWER IMPEDANCE MEASURED VALUE: 39 Ohm
Implantable Lead Implant Date: 19961213
Implantable Lead Implant Date: 20111202
Implantable Lead Location: 753859
Implantable Lead Model: 7121
Implantable Pulse Generator Implant Date: 20111202
Lead Channel Impedance Value: 360 Ohm
Lead Channel Impedance Value: 390 Ohm
Lead Channel Pacing Threshold Pulse Width: 0.5 ms
Lead Channel Sensing Intrinsic Amplitude: 1.3 mV
Lead Channel Sensing Intrinsic Amplitude: 12 mV
Lead Channel Setting Pacing Amplitude: 2 V
MDC IDC LEAD LOCATION: 753860
MDC IDC MSMT BATTERY REMAINING LONGEVITY: 30 mo
MDC IDC MSMT LEADCHNL RA PACING THRESHOLD AMPLITUDE: 0.75 V
MDC IDC MSMT LEADCHNL RV PACING THRESHOLD AMPLITUDE: 0.875 V
MDC IDC MSMT LEADCHNL RV PACING THRESHOLD PULSEWIDTH: 0.5 ms
MDC IDC SET LEADCHNL RV PACING AMPLITUDE: 2 V
MDC IDC SET LEADCHNL RV PACING PULSEWIDTH: 0.5 ms
MDC IDC SET LEADCHNL RV SENSING SENSITIVITY: 2 mV
MDC IDC STAT BRADY AP VS PERCENT: 1 %
MDC IDC STAT BRADY AS VP PERCENT: 97 %
MDC IDC STAT BRADY RV PERCENT PACED: 99 %
Pulse Gen Serial Number: 629155

## 2015-12-09 ENCOUNTER — Ambulatory Visit: Payer: Medicare Other | Admitting: Cardiology

## 2015-12-18 ENCOUNTER — Ambulatory Visit: Payer: Medicare Other | Admitting: Cardiology

## 2016-01-10 ENCOUNTER — Other Ambulatory Visit: Payer: Self-pay | Admitting: Internal Medicine

## 2016-01-10 DIAGNOSIS — I5022 Chronic systolic (congestive) heart failure: Secondary | ICD-10-CM

## 2016-02-04 ENCOUNTER — Ambulatory Visit (INDEPENDENT_AMBULATORY_CARE_PROVIDER_SITE_OTHER): Payer: Medicare Other | Admitting: Cardiology

## 2016-02-04 ENCOUNTER — Encounter: Payer: Self-pay | Admitting: Cardiology

## 2016-02-04 VITALS — BP 120/70 | HR 75 | Ht 71.0 in | Wt 374.1 lb

## 2016-02-04 DIAGNOSIS — I442 Atrioventricular block, complete: Secondary | ICD-10-CM | POA: Diagnosis not present

## 2016-02-04 DIAGNOSIS — I428 Other cardiomyopathies: Secondary | ICD-10-CM

## 2016-02-04 DIAGNOSIS — I471 Supraventricular tachycardia: Secondary | ICD-10-CM

## 2016-02-04 DIAGNOSIS — I10 Essential (primary) hypertension: Secondary | ICD-10-CM

## 2016-02-04 DIAGNOSIS — I77810 Thoracic aortic ectasia: Secondary | ICD-10-CM

## 2016-02-04 DIAGNOSIS — I5022 Chronic systolic (congestive) heart failure: Secondary | ICD-10-CM | POA: Diagnosis not present

## 2016-02-04 MED ORDER — SACUBITRIL-VALSARTAN 24-26 MG PO TABS: 1.0000 | ORAL_TABLET | Freq: Two times a day (BID) | ORAL | 11 refills | Status: DC

## 2016-02-04 NOTE — Patient Instructions (Signed)
Medication Instructions:  1) STOP VALSARTAN 2) START ENTRESTO 24/26  Labwork: Today: BMET  Testing/Procedures: Your physician has requested that you have an echocardiogram. Echocardiography is a painless test that uses sound waves to create images of your heart. It provides your doctor with information about the size and shape of your heart and how well your heart's chambers and valves are working. This procedure takes approximately one hour. There are no restrictions for this procedure.  Follow-Up: Your physician recommends that you schedule a follow-up appointment in 2 weeks with Dr. Norris Cross assistant.  Your physician wants you to follow-up in: 6 months with Dr. [name]. You will receive a reminder letter in the mail two months in advance. If you don't receive a letter, please call our office to schedule the follow-up appointment.   Any Other Special Instructions Will Be Listed Below (If Applicable).     If you need a refill on your cardiac medications before your next appointment, please call your pharmacy.

## 2016-02-04 NOTE — Progress Notes (Signed)
Cardiology Office Note    Date:  02/04/2016   ID:  [name], DOB [birth year], MRN [id number]  PCP:  Georgann Housekeeper, MD  Cardiologist:  [name], MD   Chief Complaint  Patient presents with  . Cardiomyopathy  . Hypertension  . Congestive Heart Failure    History of Present Illness:  [name] is a 61 y.o. male with a history of nonischemic DCM (normal coronary arteries by cath 1996), Sarcoidosis, chronic systolic CHF, HTN, CHB s/p dual chamber AICD, and SVT s/p ablation. He is doing well. He denies any  chest pain or pressure, dizziness or syncope . He has chronic LE edema which he says is very well controlled on diuretics.He has chronic SOB from his sarcoidosis and it is stable at present.  He is followed by Pulmonary.  He denies any claudication.  Sometimes he gets SOB laying in bed at night and has to sit up.  This does not occur too often but once a week. He has been trying to watch his diet and has lost 8lbs.     Past Medical History:  Diagnosis Date  . Asthma   . Automatic implantable cardioverter-defibrillator in situ   . Back pain   . Chronic systolic CHF (congestive heart failure), NYHA class 3 (HCC)   . Complete heart block [facility])    s/p PPM 1997 with upgrade to AICD 2011  . Depression   . DM (diabetes mellitus) (HCC)   . Glaucoma   . HTN (hypertension)   . Hyperlipidemia   . Nonischemic cardiomyopathy (HCC)    EF of 15-20% by echo 2015 and 20% by nuclear stress test 2017  . Obesity   . Sarcoid (HCC)   . Sleep apnea 06-28-12   no cpap used-unable to tolerate  . SVT (supraventricular tachycardia) (HCC)    s/p ablation    Past Surgical History:  Procedure Laterality Date  . COLONOSCOPY WITH PROPOFOL N/A 07/17/2012   Procedure: COLONOSCOPY WITH PROPOFOL;  Surgeon: Charolett Bumpers, MD;  Location: [facility];  Service: Endoscopy;  Laterality: N/A;  . HEMORRHOID SURGERY     early 20's  . PACEMAKER INSERTION    . TONSILLECTOMY       Current Medications: Outpatient Medications Prior to Visit  Medication Sig Dispense Refill  . acetaminophen (TYLENOL) 500 MG tablet Take 500 mg by mouth every 6 (six) hours as needed for pain.    Marland Kitchen albuterol (PROVENTIL HFA;VENTOLIN HFA) 108 (90 BASE) MCG/ACT inhaler Inhale 2 puffs into the lungs every 6 (six) hours as needed. For shortness of breath    . aspirin 81 MG tablet Take 81 mg by mouth daily.    . budesonide (PULMICORT FLEXHALER) 180 MCG/ACT inhaler Inhale 2 puffs into the lungs 2 (two) times daily. 1 Inhaler 5  . carvedilol (COREG) 25 MG tablet take 1 tablet by mouth twice a day 180 tablet 2  . fluticasone (FLOVENT HFA) 110 MCG/ACT inhaler Inhale 2 puffs into the lungs 2 (two) times daily. 1 Inhaler 11  . furosemide (LASIX) 40 MG tablet Take 1 tablet (40 mg total) by mouth daily. *Please call and schedule a one year follow up appointment with Dr Ladona Ridgel* 90 tablet 0  . insulin glargine (LANTUS) 100 UNIT/ML injection Inject 50 Units into the skin 2 (two) times daily.     . IRON PO Take by mouth. OTC-Patient unsure of dosage    . Liraglutide (VICTOZA) 18 MG/3ML SOPN Inject 1.2 mg into the skin daily.    Marland Kitchen  simvastatin (ZOCOR) 40 MG tablet Take 40 mg by mouth every evening.    . tadalafil (CIALIS) 10 MG tablet Take 1 tablet (10 mg total) by mouth daily as needed for erectile dysfunction. 10 tablet 3  . valsartan (DIOVAN) 320 MG tablet Take 1 tablet (320 mg total) by mouth daily. 90 tablet 3  . vitamin B-12 (CYANOCOBALAMIN) 250 MCG tablet Take 250 mcg by mouth daily.    . vitamin C (ASCORBIC ACID) 500 MG tablet Take 500 mg by mouth daily.     No facility-administered medications prior to visit.      Allergies:   Penicillins   Social History   Social History  . Marital status: Married    Spouse name: N/A  . Number of children: N/A  . Years of education: N/A   Social History Main Topics  . Smoking status: Never Smoker  . Smokeless tobacco: Never Used  . Alcohol use Yes      Comment: occasionally  . Drug use: No  . Sexual activity: Yes   Other Topics Concern  . None   Social History Narrative  . None     Family History:  The patient's family history includes Alzheimer's disease in his mother; CVA in his father; Diabetes in his brother, mother, and sister; Epilepsy in his sister; Heart failure in his father.   ROS:   Please see the history of present illness.    ROS All other systems reviewed and are negative.  No flowsheet data found.     PHYSICAL EXAM:   VS:  BP 120/70   Pulse 75   Ht 5\' 11"  (1.803 m)   Wt (!) 374 lb 1.9 oz (169.7 kg)   SpO2 96%   BMI 52.18 kg/m    GEN: Well nourished, well developed, in no acute distress  HEENT: normal  Neck: no JVD, carotid bruits, or masses Cardiac: RRR; no murmurs, rubs, or gallops,no edema.  Intact distal pulses bilaterally.  Respiratory:  clear to auscultation bilaterally, normal work of breathing GI: soft, nontender, nondistended, + BS MS: no deformity or atrophy  Skin: warm and dry, no rash Neuro:  Alert and Oriented x 3, Strength and sensation are intact Psych: euthymic mood, full affect  Wt Readings from Last 3 Encounters:  02/04/16 (!) 374 lb 1.9 oz (169.7 kg)  09/23/15 (!) 382 lb (173.3 kg)  06/23/15 (!) 379 lb (171.9 kg)      Studies/Labs Reviewed:   EKG:  EKG is not ordered today.    Recent Labs: 05/12/2015: BUN 13; Creat 1.04; Potassium 4.3; Sodium 138 06/04/2015: Brain Natriuretic Peptide 135.0   Lipid Panel    Component Value Date/Time   CHOL  09/22/2009 0330    134        ATP III CLASSIFICATION:  <200     mg/dL   Desirable  224-497  mg/dL   Borderline High  >=530    mg/dL   High          TRIG 051 09/22/2009 0330   HDL 47 09/22/2009 0330   CHOLHDL 2.9 09/22/2009 0330   VLDL 25 09/22/2009 0330   LDLCALC  09/22/2009 0330    62        Total Cholesterol/HDL:CHD Risk Coronary Heart Disease Risk Table                     Men   Women  1/2 Average Risk   3.4   3.3   Average Risk  5.0   4.4  2 X Average Risk   9.6   7.1  3 X Average Risk  23.4   11.0        Use the calculated Patient Ratio above and the CHD Risk Table to determine the patient's CHD Risk.        ATP III CLASSIFICATION (LDL):  <100     mg/dL   Optimal  161-096  mg/dL   Near or Above                    Optimal  130-159  mg/dL   Borderline  045-409  mg/dL   High  >811     mg/dL   Very High    Additional studies/ records that were reviewed today include:  none    ASSESSMENT:    1. Chronic systolic heart failure (HCC)   2. Nonischemic cardiomyopathy (HCC)   3. Essential hypertension   4. Complete heart block (HCC)   5. SVT (supraventricular tachycardia) (HCC)   6. Dilated aortic root (HCC)      PLAN:  In order of problems listed above:  1. Chronic systolic CHF - he appears euvolemic on exam today and weight is stable and he has actually lost 8lbs with diet.  He has had some SOB and what sounds like some PND and orthopnea at night on occasion but ? whether this is related to his sarcoid of CHF.  Continue BB/diuretic. I am going to change his  2. Nonischemic DCM (echo in 2015 showed EF 15-20% and 20% by nuclear stress test 2017).  Continue BB and diuretic.  Will change ARB to [facility] and followup with PA in 2 weeks for uptitration along with BMET.  If he tolerates Entresto then consider adding Aldactone. 3. HTN - BP controlled on current meds.Continue ARB and BB. 4. Complete heart block s/p dual chamber ICD followed in device clinic 5. SVT s/p ablation - continue BB 6. Dilated aortic root - Repeat echo to assess    Medication Adjustments/Labs and Tests Ordered: Current medicines are reviewed at length with the patient today.  Concerns regarding medicines are outlined above.  Medication changes, Labs and Tests ordered today are listed in the Patient Instructions below.  There are no Patient Instructions on file for this visit.   Signed, [name], MD    02/04/2016 1:24 PM    [facility] [address] [place], [place], [place]  [address] Phone: [phone]; Fax: [phone]

## 2016-02-05 LAB — BASIC METABOLIC PANEL
BUN/Creatinine Ratio: 9 — ABNORMAL LOW (ref 10–24)
BUN: 8 mg/dL (ref 8–27)
CALCIUM: 9.8 mg/dL (ref 8.6–10.2)
CO2: 26 mmol/L (ref 18–29)
CREATININE: 0.93 mg/dL (ref 0.76–1.27)
Chloride: 105 mmol/L (ref 96–106)
GFR calc Af Amer: 103 mL/min/{1.73_m2} (ref >59)
GFR, EST NON AFRICAN AMERICAN: 89 mL/min/{1.73_m2} (ref >59)
GLUCOSE: 126 mg/dL — AB (ref 65–99)
Potassium: 4.4 mmol/L (ref 3.5–5.2)
Sodium: 144 mmol/L (ref 134–144)

## 2016-02-09 ENCOUNTER — Ambulatory Visit (INDEPENDENT_AMBULATORY_CARE_PROVIDER_SITE_OTHER): Payer: Medicare Other | Admitting: *Deleted

## 2016-02-09 DIAGNOSIS — I428 Other cardiomyopathies: Secondary | ICD-10-CM | POA: Diagnosis not present

## 2016-02-09 NOTE — Progress Notes (Signed)
Remote ICD transmission.   

## 2016-02-10 ENCOUNTER — Encounter: Payer: Self-pay | Admitting: Cardiology

## 2016-02-18 ENCOUNTER — Ambulatory Visit (HOSPITAL_COMMUNITY): Payer: Medicare Other | Attending: Cardiology

## 2016-02-18 ENCOUNTER — Encounter: Payer: Self-pay | Admitting: Physician Assistant

## 2016-02-18 ENCOUNTER — Other Ambulatory Visit: Payer: Self-pay

## 2016-02-18 ENCOUNTER — Telehealth: Payer: Self-pay

## 2016-02-18 DIAGNOSIS — I110 Hypertensive heart disease with heart failure: Secondary | ICD-10-CM | POA: Diagnosis not present

## 2016-02-18 DIAGNOSIS — Z6841 Body Mass Index (BMI) 40.0 and over, adult: Secondary | ICD-10-CM | POA: Diagnosis not present

## 2016-02-18 DIAGNOSIS — Z8249 Family history of ischemic heart disease and other diseases of the circulatory system: Secondary | ICD-10-CM | POA: Diagnosis not present

## 2016-02-18 DIAGNOSIS — I509 Heart failure, unspecified: Secondary | ICD-10-CM | POA: Insufficient documentation

## 2016-02-18 DIAGNOSIS — E785 Hyperlipidemia, unspecified: Secondary | ICD-10-CM | POA: Diagnosis not present

## 2016-02-18 DIAGNOSIS — J45909 Unspecified asthma, uncomplicated: Secondary | ICD-10-CM | POA: Insufficient documentation

## 2016-02-18 DIAGNOSIS — E119 Type 2 diabetes mellitus without complications: Secondary | ICD-10-CM | POA: Diagnosis not present

## 2016-02-18 DIAGNOSIS — I77810 Thoracic aortic ectasia: Secondary | ICD-10-CM | POA: Diagnosis not present

## 2016-02-18 DIAGNOSIS — I471 Supraventricular tachycardia: Secondary | ICD-10-CM | POA: Insufficient documentation

## 2016-02-18 DIAGNOSIS — I442 Atrioventricular block, complete: Secondary | ICD-10-CM | POA: Insufficient documentation

## 2016-02-18 DIAGNOSIS — I071 Rheumatic tricuspid insufficiency: Secondary | ICD-10-CM | POA: Insufficient documentation

## 2016-02-18 DIAGNOSIS — G4733 Obstructive sleep apnea (adult) (pediatric): Secondary | ICD-10-CM | POA: Insufficient documentation

## 2016-02-18 DIAGNOSIS — I429 Cardiomyopathy, unspecified: Secondary | ICD-10-CM | POA: Insufficient documentation

## 2016-02-18 DIAGNOSIS — D869 Sarcoidosis, unspecified: Secondary | ICD-10-CM | POA: Insufficient documentation

## 2016-02-18 LAB — CUP PACEART REMOTE DEVICE CHECK
Battery Voltage: 2.86 V
Brady Statistic AP VP Percent: 3.9 %
Brady Statistic AS VP Percent: 96 %
Brady Statistic RA Percent Paced: 3.6 %
Date Time Interrogation Session: 20180130094304
HIGH POWER IMPEDANCE MEASURED VALUE: 43 Ohm
Implantable Lead Implant Date: 19961213
Implantable Lead Location: 753860
Implantable Lead Model: 5524
Implantable Lead Model: 7121
Lead Channel Pacing Threshold Amplitude: 0.75 V
Lead Channel Pacing Threshold Pulse Width: 0.5 ms
Lead Channel Sensing Intrinsic Amplitude: 1.4 mV
Lead Channel Setting Pacing Amplitude: 2 V
Lead Channel Setting Pacing Pulse Width: 0.5 ms
Lead Channel Setting Sensing Sensitivity: 2 mV
MDC IDC LEAD IMPLANT DT: 20111202
MDC IDC LEAD LOCATION: 753859
MDC IDC MSMT BATTERY REMAINING LONGEVITY: 29 mo
MDC IDC MSMT BATTERY REMAINING PERCENTAGE: 31 %
MDC IDC MSMT LEADCHNL RA IMPEDANCE VALUE: 410 Ohm
MDC IDC MSMT LEADCHNL RA PACING THRESHOLD AMPLITUDE: 0.75 V
MDC IDC MSMT LEADCHNL RA PACING THRESHOLD PULSEWIDTH: 0.5 ms
MDC IDC MSMT LEADCHNL RV IMPEDANCE VALUE: 380 Ohm
MDC IDC MSMT LEADCHNL RV SENSING INTR AMPL: 12 mV
MDC IDC PG IMPLANT DT: 20111202
MDC IDC PG SERIAL: 629155
MDC IDC SET LEADCHNL RA PACING AMPLITUDE: 2 V
MDC IDC STAT BRADY AP VS PERCENT: 1 %
MDC IDC STAT BRADY AS VS PERCENT: 1 %
MDC IDC STAT BRADY RV PERCENT PACED: 99 %

## 2016-02-18 MED ORDER — PERFLUTREN LIPID MICROSPHERE
1.0000 mL | INTRAVENOUS | Status: AC | PRN
  Administered 2016-02-18: 2 mL via INTRAVENOUS

## 2016-02-18 NOTE — Telephone Encounter (Signed)
-----   Message from Quintella Reichert, MD sent at 02/18/2016  4:59 PM EST ----- Echo showed severe LVH with severe LV dysfunction with EF 15% and diffuse HK and high ventricular filling pressures. Mildly dilated aortic root.  No significant change in echo from prior echo.  Ascending aorta 27mm.  Repeat echo in 1 year for dilated aortic root.

## 2016-02-18 NOTE — Telephone Encounter (Signed)
Informed patient of results and verbal understanding expressed.  Repeat ECHO ordered to be scheduled in 1 year. Patient agrees with treatment plan. 

## 2016-02-22 ENCOUNTER — Ambulatory Visit (INDEPENDENT_AMBULATORY_CARE_PROVIDER_SITE_OTHER): Payer: Medicare Other | Admitting: Physician Assistant

## 2016-02-22 ENCOUNTER — Encounter: Payer: Self-pay | Admitting: Physician Assistant

## 2016-02-22 VITALS — BP 132/86 | HR 96 | Ht 71.0 in | Wt 378.8 lb

## 2016-02-22 DIAGNOSIS — I77810 Thoracic aortic ectasia: Secondary | ICD-10-CM | POA: Diagnosis not present

## 2016-02-22 DIAGNOSIS — I5022 Chronic systolic (congestive) heart failure: Secondary | ICD-10-CM | POA: Diagnosis not present

## 2016-02-22 DIAGNOSIS — I428 Other cardiomyopathies: Secondary | ICD-10-CM

## 2016-02-22 DIAGNOSIS — I10 Essential (primary) hypertension: Secondary | ICD-10-CM | POA: Diagnosis not present

## 2016-02-22 MED ORDER — SACUBITRIL-VALSARTAN 49-51 MG PO TABS: 1.0000 | ORAL_TABLET | Freq: Two times a day (BID) | ORAL | 6 refills | Status: DC

## 2016-02-22 NOTE — Patient Instructions (Signed)
Medication Instructions:  Your physician has recommended you make the following change in your medication:  1. Increase Entresto ( 49/51mg )  twice daily 2. Restart you lasix (40 mg ) daily  Labwork: Your physician recommends that you have lab work today: bmet   Testing/Procedures: -None  Follow-Up: Your physician recommends that you keep your schedule  follow-up appointment with Dr. [name].    Any Other Special Instructions Will Be Listed Below (If Applicable).     If you need a refill on your cardiac medications before your next appointment, please call your pharmacy.

## 2016-02-22 NOTE — Progress Notes (Signed)
Cardiology Office Note    Date:  02/22/2016   ID:  [name], DOB 04/30/1955, MRN [id number]  PCP:  Jacob Housekeeper, MD  Cardiologist: Dr. Mayford Walsh  Chief Complaint  Patient presents with  . Follow-up    History of Present Illness:  [name] is a 61 y.o. male   with a history of nonischemic DCM (normal coronary arteries by cath 1996), Sarcoidosis, chronic systolic CHF, HTN, CHB s/p dual chamber AICD, and SVT s/p ablation. He saw Dr. Mayford Walsh 2 weeks ago and was having some PND and orthopnea on occasion but she thought might be related to his sarcoid. She changed his ARB to [facility] and he is here for two-week follow-up for up titration along with blood work.  Patient comes in today saying he stopped his Lasix 2 days ago because he was urinating a lot. He also complains of edema. His weight is up 4 pounds. He thought he was urinating enough without it. So far is tolerating the Entresto. He says his teeth hurt when he first started taking it but he didn't know if it was related or not and this has resolved. 2-D echo 02/18/16 showed severe LVH LVEF 15% with diffuse hypokinesis and high ventricular filling pressures. The ascending aorta was mildly dilated.  Past Medical History:  Diagnosis Date  . Asthma   . Automatic implantable cardioverter-defibrillator in situ   . Back pain   . Chronic systolic CHF (congestive heart failure), NYHA class 3 (HCC)   . Complete heart block [facility]    s/p PPM 1997 with upgrade to AICD 2011  . Depression   . Dilated aortic root (HCC)    40mm ascending aortic root  . DM (diabetes mellitus) (HCC)   . Glaucoma   . HTN (hypertension)   . Hyperlipidemia   . Nonischemic cardiomyopathy (HCC)    EF of 15-20% by echo 2015 and 20% by nuclear stress test 2017  . Obesity   . Sarcoid (HCC)   . Sleep apnea 06-28-12   no cpap used-unable to tolerate  . SVT (supraventricular tachycardia) (HCC)    s/p ablation    Past Surgical History:  Procedure  Laterality Date  . COLONOSCOPY WITH PROPOFOL N/A 07/17/2012   Procedure: COLONOSCOPY WITH PROPOFOL;  Surgeon: Charolett Bumpers, MD;  Location: [facility];  Service: Endoscopy;  Laterality: N/A;  . HEMORRHOID SURGERY     early 20's  . PACEMAKER INSERTION    . TONSILLECTOMY      Current Medications: Outpatient Medications Prior to Visit  Medication Sig Dispense Refill  . albuterol (PROVENTIL HFA;VENTOLIN HFA) 108 (90 BASE) MCG/ACT inhaler Inhale 2 puffs into the lungs every 6 (six) hours as needed. For shortness of breath    . aspirin 81 MG tablet Take 81 mg by mouth daily.    . budesonide (PULMICORT FLEXHALER) 180 MCG/ACT inhaler Inhale 2 puffs into the lungs 2 (two) times daily. 1 Inhaler 5  . carvedilol (COREG) 25 MG tablet take 1 tablet by mouth twice a day 180 tablet 2  . fluticasone (FLOVENT HFA) 110 MCG/ACT inhaler Inhale 2 puffs into the lungs 2 (two) times daily. 1 Inhaler 11  . furosemide (LASIX) 40 MG tablet Take 1 tablet (40 mg total) by mouth daily. *Please call and schedule a one year follow up appointment with Dr Ladona Ridgel* 90 tablet 0  . insulin glargine (LANTUS) 100 UNIT/ML injection Inject 50 Units into the skin 2 (two) times daily.     . IRON PO  Take 1 tablet by mouth as directed. OTC-Patient unsure of dosage     . Liraglutide (VICTOZA) 18 MG/3ML SOPN Inject 1.2 mg into the skin daily.    . methocarbamol (ROBAXIN) 500 MG tablet Take 500 mg by mouth 3 (three) times daily as needed for pain.  2  . simvastatin (ZOCOR) 40 MG tablet Take 40 mg by mouth every evening.    . tadalafil (CIALIS) 10 MG tablet Take 1 tablet (10 mg total) by mouth daily as needed for erectile dysfunction. 10 tablet 3  . vitamin B-12 (CYANOCOBALAMIN) 250 MCG tablet Take 250 mcg by mouth daily.    . vitamin C (ASCORBIC ACID) 500 MG tablet Take 500 mg by mouth daily.    . sacubitril-valsartan (ENTRESTO) 24-26 MG Take 1 tablet by mouth 2 (two) times daily. 60 tablet 11  . acetaminophen (TYLENOL) 500 MG tablet  Take 500 mg by mouth every 6 (six) hours as needed for pain.     No facility-administered medications prior to visit.      Allergies:   Penicillins   Social History   Social History  . Marital status: Married    Spouse name: N/A  . Number of children: N/A  . Years of education: N/A   Social History Main Topics  . Smoking status: Never Smoker  . Smokeless tobacco: Never Used  . Alcohol use Yes     Comment: occasionally  . Drug use: No  . Sexual activity: Yes   Other Topics Concern  . None   Social History Narrative  . None     Family History:  The patient's   family history includes Alzheimer's disease in his mother; CVA in his father; Diabetes in his brother, mother, and sister; Epilepsy in his sister; Heart failure in his father.   ROS:   Please see the history of present illness.    Review of Systems  Constitution: Negative.  HENT: Negative.   Cardiovascular: Positive for dyspnea on exertion, irregular heartbeat and leg swelling.  Respiratory: Positive for cough.   Endocrine: Negative.   Hematologic/Lymphatic: Negative.   Musculoskeletal: Negative.   Gastrointestinal: Negative.   Genitourinary: Negative.   Neurological: Negative.    All other systems reviewed and are negative.   PHYSICAL EXAM:   VS:  BP 132/86   Pulse 96   Ht 5\' 11"  (1.803 m)   Wt (!) 378 lb 12.8 oz (171.8 kg)   SpO2 93%   BMI 52.83 kg/m   Physical Exam  GEN: Obese, in no acute distress  Neck: no JVD, carotid bruits, or masses Cardiac:RRR; distant heart sounds no murmurs, rubs, or gallops  Respiratory: Crease breath sounds but clear to auscultation bilaterally, normal work of breathing GI: soft, nontender, nondistended, + BS Ext: +1 edema bilaterally without cyanosis, clubbing,  decreased distal pulses bilaterally   Wt Readings from Last 3 Encounters:  02/22/16 (!) 378 lb 12.8 oz (171.8 kg)  02/04/16 (!) 374 lb 1.9 oz (169.7 kg)  09/23/15 (!) 382 lb (173.3 kg)       Studies/Labs Reviewed:   EKG:  EKG is not ordered today.  Recent Labs: 06/04/2015: Brain Natriuretic Peptide 135.0 02/04/2016: BUN 8; Creatinine, Ser 0.93; Potassium 4.4; Sodium 144   Lipid Panel    Component Value Date/Time   CHOL  09/22/2009 0330    134        ATP III CLASSIFICATION:  <200     mg/dL   Desirable  161-096  mg/dL   Borderline High  >=  240    mg/dL   High          TRIG 409 09/22/2009 0330   HDL 47 09/22/2009 0330   CHOLHDL 2.9 09/22/2009 0330   VLDL 25 09/22/2009 0330   LDLCALC  09/22/2009 0330    62        Total Cholesterol/HDL:CHD Risk Coronary Heart Disease Risk Table                     Men   Women  1/2 Average Risk   3.4   3.3  Average Risk       5.0   4.4  2 X Average Risk   9.6   7.1  3 X Average Risk  23.4   11.0        Use the calculated Patient Ratio above and the CHD Risk Table to determine the patient's CHD Risk.        ATP III CLASSIFICATION (LDL):  <100     mg/dL   Optimal  811-914  mg/dL   Near or Above                    Optimal  130-159  mg/dL   Borderline  782-956  mg/dL   High  >213     mg/dL   Very High    Additional studies/ records that were reviewed today include:  2-D echo 2/8/18Study Conclusions   - Left ventricle: The cavity size was normal. Wall thickness was   increased in a pattern of severe LVH. Systolic function was   severely reduced. The estimated ejection fraction was 15%.   Diffuse hypokinesis. Doppler parameters are consistent with high   ventricular filling pressure. - Ascending aorta: The ascending aorta was mildly dilated.   Impressions:   - Technically difficult; definity used; severe global reduction in   LV systolic function; severe LVH; restrictive filling; mildly   dilated ascending aorta.     ASSESSMENT:    1. Chronic systolic heart failure (HCC)   2. Nonischemic cardiomyopathy (HCC)   3. Essential hypertension   4. Dilated aortic root (HCC)      PLAN:  In order of problems listed  above:  Chronic systolic heart failure just started on Entresto 2 weeks ago. Patient was urinating a lot so he stopped his Lasix for 2 days because he didn't think he needed it. His weight is up 4 pounds and he does have some leg edema. I've asked him to resume his Lasix. Think is an acute heart failure so we'll uptitrate his Entresto 49/51 mg. Follow-up in 2 weeks for further titration  Nonischemic cardiomyopathy LVEF 15% on recent 2-D echo with severe LVH. Consider Aldactone after Entresto titrated up.  Essential hypertension blood pressure is stable today  Dilated aortic root unchanged on recent 2-D echo repeat in one year.  Medication Adjustments/Labs and Tests Ordered: Current medicines are reviewed at length with the patient today.  Concerns regarding medicines are outlined above.  Medication changes, Labs and Tests ordered today are listed in the Patient Instructions below. Patient Instructions  Medication Instructions:  Your physician has recommended you make the following change in your medication:  1. Increase Entresto ( 49/51mg )  twice daily 2. Restart you lasix (40 mg ) daily  Labwork: Your physician recommends that you have lab work today: bmet   Testing/Procedures: -None  Follow-Up: Your physician recommends that you keep your schedule  follow-up appointment with Dr. Mayford Walsh.  Any Other Special Instructions Will Be Listed Below (If Applicable).     If you need a refill on your cardiac medications before your next appointment, please call your pharmacy.      Elson Clan, PA-C  02/22/2016 2:58 PM    [facility] [address] [place], [place], [place]  [address] Phone: [phone]; Fax: [phone]

## 2016-02-23 LAB — BASIC METABOLIC PANEL
BUN/Creatinine Ratio: 7 — ABNORMAL LOW (ref 10–24)
BUN: 7 mg/dL — AB (ref 8–27)
CALCIUM: 9.7 mg/dL (ref 8.6–10.2)
CHLORIDE: 104 mmol/L (ref 96–106)
CO2: 23 mmol/L (ref 18–29)
Creatinine, Ser: 1.01 mg/dL (ref 0.76–1.27)
GFR calc Af Amer: 93 mL/min/{1.73_m2} (ref >59)
GFR calc non Af Amer: 80 mL/min/{1.73_m2} (ref >59)
GLUCOSE: 130 mg/dL — AB (ref 65–99)
Potassium: 4 mmol/L (ref 3.5–5.2)
Sodium: 140 mmol/L (ref 134–144)

## 2016-02-26 ENCOUNTER — Encounter: Payer: Self-pay | Admitting: *Deleted

## 2016-03-08 ENCOUNTER — Encounter: Payer: Self-pay | Admitting: Cardiology

## 2016-03-08 ENCOUNTER — Ambulatory Visit (INDEPENDENT_AMBULATORY_CARE_PROVIDER_SITE_OTHER): Payer: Medicare Other | Admitting: Cardiology

## 2016-03-08 ENCOUNTER — Telehealth: Payer: Self-pay

## 2016-03-08 VITALS — BP 118/84 | HR 88 | Ht 71.0 in | Wt 372.4 lb

## 2016-03-08 DIAGNOSIS — I10 Essential (primary) hypertension: Secondary | ICD-10-CM

## 2016-03-08 DIAGNOSIS — I442 Atrioventricular block, complete: Secondary | ICD-10-CM

## 2016-03-08 DIAGNOSIS — I5022 Chronic systolic (congestive) heart failure: Secondary | ICD-10-CM

## 2016-03-08 DIAGNOSIS — I77810 Thoracic aortic ectasia: Secondary | ICD-10-CM | POA: Diagnosis not present

## 2016-03-08 DIAGNOSIS — I428 Other cardiomyopathies: Secondary | ICD-10-CM

## 2016-03-08 DIAGNOSIS — I471 Supraventricular tachycardia: Secondary | ICD-10-CM

## 2016-03-08 NOTE — Progress Notes (Signed)
Cardiology Office Note    Date:  03/09/2016   ID:  [name], DOB August 04, 1955, MRN [id number]  PCP:  Georgann Housekeeper, MD  Cardiologist:  [name], MD   Chief Complaint  Patient presents with  . Cardiomyopathy  . Congestive Heart Failure  . Hypertension    History of Present Illness:  [name] is a 61 y.o. male with a history of nonischemic DCM (normal coronary arteries by cath 1996), Sarcoidosis, chronic systolic CHF, HTN, CHB s/p dual chamber AICD and SVT s/p ablation. He was started on Entresto recently and was seen by my NP a few weeks ago and the Entresto was uptitrated.  He is tolerating it and actually says that he is feeling better with more energy. He denies any chest pain or pressure, dizziness or syncope . He says that his chronic LE edema has resovled on diuretics.He has chronic SOB from his sarcoidosis and it is stable at present.  He is followed by Pulmonary.  He denies any claudication.  Since going on Entresto his PND and orthopnea have resolved. He has been trying to watch his diet and has continued to lose weight.   Of note, he misunderstood the directions with his medications and when he was placed on Entresto, he stopped his Coreg so he has not been on that.      Past Medical History:  Diagnosis Date  . Asthma   . Automatic implantable cardioverter-defibrillator in situ   . Back pain   . Chronic systolic CHF (congestive heart failure), NYHA class 3 (HCC)   . Complete heart block [facility])    s/p PPM 1997 with upgrade to AICD 2011  . Depression   . Dilated aortic root (HCC)    60mm ascending aortic root  . DM (diabetes mellitus) (HCC)   . Glaucoma   . HTN (hypertension)   . Hyperlipidemia   . Nonischemic cardiomyopathy (HCC)    EF of 15-20% by echo 2015 and 20% by nuclear stress test 2017  . Obesity   . Sarcoid (HCC)   . Sleep apnea 06-28-12   no cpap used-unable to tolerate  . SVT (supraventricular tachycardia) (HCC)    s/p ablation     Past Surgical History:  Procedure Laterality Date  . COLONOSCOPY WITH PROPOFOL N/A 07/17/2012   Procedure: COLONOSCOPY WITH PROPOFOL;  Surgeon: Charolett Bumpers, MD;  Location: [facility];  Service: Endoscopy;  Laterality: N/A;  . HEMORRHOID SURGERY     early 20's  . PACEMAKER INSERTION    . TONSILLECTOMY      Current Medications: Current Meds  Medication Sig  . acetaminophen (TYLENOL) 650 MG CR tablet Take 650 mg by mouth every 8 (eight) hours as needed for pain.  Marland Kitchen albuterol (PROVENTIL HFA;VENTOLIN HFA) 108 (90 BASE) MCG/ACT inhaler Inhale 2 puffs into the lungs every 6 (six) hours as needed. For shortness of breath  . aspirin 81 MG tablet Take 81 mg by mouth daily.  . budesonide (PULMICORT FLEXHALER) 180 MCG/ACT inhaler Inhale 2 puffs into the lungs 2 (two) times daily.  . fluticasone (FLOVENT HFA) 110 MCG/ACT inhaler Inhale 2 puffs into the lungs 2 (two) times daily.  . furosemide (LASIX) 40 MG tablet Take 1 tablet (40 mg total) by mouth daily. *Please call and schedule a one year follow up appointment with Dr Ladona Ridgel*  . insulin glargine (LANTUS) 100 UNIT/ML injection Inject 50 Units into the skin 2 (two) times daily.   . IRON PO Take 1 tablet by  mouth as directed. OTC-Patient unsure of dosage   . Liraglutide (VICTOZA) 18 MG/3ML SOPN Inject 1.2 mg into the skin daily.  . methocarbamol (ROBAXIN) 500 MG tablet Take 500 mg by mouth 3 (three) times daily as needed for pain.  . sacubitril-valsartan (ENTRESTO) 49-51 MG Take 1 tablet by mouth 2 (two) times daily.  . simvastatin (ZOCOR) 40 MG tablet Take 40 mg by mouth every evening.  . tadalafil (CIALIS) 10 MG tablet Take 1 tablet (10 mg total) by mouth daily as needed for erectile dysfunction.  . vitamin B-12 (CYANOCOBALAMIN) 250 MCG tablet Take 250 mcg by mouth daily.  . vitamin C (ASCORBIC ACID) 500 MG tablet Take 500 mg by mouth daily.    Allergies:   Penicillins   Social History   Social History  . Marital status: Married     Spouse name: N/A  . Number of children: N/A  . Years of education: N/A   Social History Main Topics  . Smoking status: Never Smoker  . Smokeless tobacco: Never Used  . Alcohol use Yes     Comment: occasionally  . Drug use: No  . Sexual activity: Yes   Other Topics Concern  . None   Social History Narrative  . None     Family History:  The patient's family history includes Alzheimer's disease in his mother; CVA in his father; Diabetes in his brother, mother, and sister; Epilepsy in his sister; Heart failure in his father.   ROS:   Please see the history of present illness.    ROS All other systems reviewed and are negative.  No flowsheet data found.     PHYSICAL EXAM:   VS:  BP 118/84   Pulse 88   Ht 5\' 11"  (1.803 m)   Wt (!) 372 lb 6.4 oz (168.9 kg)   SpO2 95%   BMI 51.94 kg/m    GEN: Well nourished, well developed, in no acute distress  HEENT: normal  Neck: no JVD, carotid bruits, or masses Cardiac: RRR; no murmurs, rubs, or gallops,no edema.  Intact distal pulses bilaterally.  Respiratory:  clear to auscultation bilaterally, normal work of breathing GI: soft, nontender, nondistended, + BS MS: no deformity or atrophy  Skin: warm and dry, no rash Neuro:  Alert and Oriented x 3, Strength and sensation are intact Psych: euthymic mood, full affect  Wt Readings from Last 3 Encounters:  03/08/16 (!) 372 lb 6.4 oz (168.9 kg)  02/22/16 (!) 378 lb 12.8 oz (171.8 kg)  02/04/16 (!) 374 lb 1.9 oz (169.7 kg)      Studies/Labs Reviewed:   EKG:  EKG is not ordered today.   Recent Labs: 06/04/2015: Brain Natriuretic Peptide 135.0 02/22/2016: BUN 7; Creatinine, Ser 1.01; Potassium 4.0; Sodium 140   Lipid Panel    Component Value Date/Time   CHOL  09/22/2009 0330    134        ATP III CLASSIFICATION:  <200     mg/dL   Desirable  621-308  mg/dL   Borderline High  >=657    mg/dL   High          TRIG 846 09/22/2009 0330   HDL 47 09/22/2009 0330   CHOLHDL 2.9  09/22/2009 0330   VLDL 25 09/22/2009 0330   LDLCALC  09/22/2009 0330    62        Total Cholesterol/HDL:CHD Risk Coronary Heart Disease Risk Table  Men   Women  1/2 Average Risk   3.4   3.3  Average Risk       5.0   4.4  2 X Average Risk   9.6   7.1  3 X Average Risk  23.4   11.0        Use the calculated Patient Ratio above and the CHD Risk Table to determine the patient's CHD Risk.        ATP III CLASSIFICATION (LDL):  <100     mg/dL   Optimal  161-096  mg/dL   Near or Above                    Optimal  130-159  mg/dL   Borderline  045-409  mg/dL   High  >811     mg/dL   Very High    Additional studies/ records that were reviewed today include:  none    ASSESSMENT:    1. Chronic systolic heart failure (HCC)   2. Nonischemic cardiomyopathy (HCC)   3. Essential hypertension   4. Complete heart block (HCC)   5. SVT (supraventricular tachycardia) (HCC)   6. Dilated aortic root (HCC)      PLAN:  In order of problems listed above:  1. Chronic systolic CHF - he appears euvolemic on exam today and weight is stable and he has actually lost more weight with diet.  His SOB, PND and orthopnea have resolved on Entresto.  He will continue Entresto and diuretic. I have instructed him to restart his Carvedilol.  He will followup in HTN clinic in 1 week and if BP is stable then will try to [employer].    2. Nonischemic DCM (echo in 2015 showed EF 15-20% and 20% by nuclear stress test 2017).  Restart Coreg and continue diuretic and Entresto.  Will continue to try to maximize Entresto and  f he tolerates Entresto then consider adding Aldactone.  3. HTN - BP controlled on current meds.  He has not been taking his Coreg. I have asked him to restart it.    4. Complete heart block s/p dual chamber ICD followed in device clinic  5. SVT s/p ablation - continue BB  6. Dilated aortic root - Repeat echo to assess 02/2017.  I will have him followup with my PA in  6 weeks.    Medication Adjustments/Labs and Tests Ordered: Current medicines are reviewed at length with the patient today.  Concerns regarding medicines are outlined above.  Medication changes, Labs and Tests ordered today are listed in the Patient Instructions below.  Patient Instructions  Please call our office to confirm your current medications. We will call you with a plan after your medications have been clarified.    Signed, [name], MD  03/09/2016 12:16 PM    [facility] [address] [place], [place], [place]  [address] Phone: [phone]; Fax: [phone]

## 2016-03-08 NOTE — Patient Instructions (Signed)
Please call our office to confirm your current medications. We will call you with a plan after your medications have been clarified.

## 2016-03-08 NOTE — Telephone Encounter (Signed)
Patient called to clarify medications after OV today. Confirmed with patient he is NOT taking Coreg - he states he stopped this when he started [facility] 02/04/16. Confirmed with patient he also stopped Valsartan at that time. His med list in EPIC is correct.  To Dr. [name] for recommendations.

## 2016-03-09 MED ORDER — CARVEDILOL 25 MG PO TABS: 25.0000 mg | ORAL_TABLET | Freq: Two times a day (BID) | ORAL | 3 refills | Status: DC

## 2016-03-09 NOTE — Telephone Encounter (Signed)
Patient needs to restart dose of COreg he was on before and then followup in HTN clinic in 1 week and if BP stable increase Entresto at that time.

## 2016-03-09 NOTE — Telephone Encounter (Signed)
Instructed patient to RESTART COREG 25 mg BID. Patient will come in this Friday for BMET. Scheduled patient 3/6 in the HTN Clinic to check BP and up-titrate Entresto. Scheduled patient 4/5 with Ronie Spies.  Patient agrees with treatment plan.

## 2016-03-11 ENCOUNTER — Other Ambulatory Visit: Payer: Medicare Other

## 2016-03-11 ENCOUNTER — Other Ambulatory Visit (INDEPENDENT_AMBULATORY_CARE_PROVIDER_SITE_OTHER): Payer: Medicare Other

## 2016-03-11 DIAGNOSIS — I5022 Chronic systolic (congestive) heart failure: Secondary | ICD-10-CM | POA: Diagnosis not present

## 2016-03-12 LAB — BASIC METABOLIC PANEL
BUN / CREAT RATIO: 11 (ref 10–24)
BUN: 10 mg/dL (ref 8–27)
CO2: 25 mmol/L (ref 18–29)
CREATININE: 0.95 mg/dL (ref 0.76–1.27)
Calcium: 9.5 mg/dL (ref 8.6–10.2)
Chloride: 100 mmol/L (ref 96–106)
GFR calc non Af Amer: 87 mL/min/{1.73_m2} (ref >59)
GFR, EST AFRICAN AMERICAN: 100 mL/min/{1.73_m2} (ref >59)
GLUCOSE: 148 mg/dL — AB (ref 65–99)
Potassium: 4.5 mmol/L (ref 3.5–5.2)
Sodium: 137 mmol/L (ref 134–144)

## 2016-03-12 LAB — PLEASE NOTE

## 2016-03-15 ENCOUNTER — Ambulatory Visit: Payer: Medicare Other

## 2016-03-15 NOTE — Progress Notes (Deleted)
Patient ID: LUE DUBUQUE                 DOB: [birth year]                      MRN: [id number]     HPI: [name] is a 61 y.o. male referred by Dr. [name] to HTN clinic for HF medication optimization. PMH is significant for nonischemic DMC, chronic systolic CHF with LVEF 20% on nuclear stress test in 2017, HTN, chronic SOB from sarcoidosis, and s/p AICD and SVT s/p ablation. His Entresto dose was recently uptitrated and pt reported that he had more energy after and his PND and orthopnea resolved. He accidentally stopped taking his carvedilol when he started [facility]. He was advised to resume Entresto at last OV 1 week ago and presents today for further HF medication optimization.  He has been trying to watch his diet and has continued to lose weight.  Inc entresto, start spiro SCr 0.95, K 4.5 (not on K supp)  Current HTN meds: carvedilol 25mg  BID, Entresto 49-51mg  BID, furosemide 40mg  daily Previously tried:  BP goal: <130/32mmHg  Family History: The patient's family history includes Alzheimer's disease in his mother; CVA in his father; Diabetes in his brother, mother, and sister; Epilepsy in his sister; Heart failure in his father.   Social History: Denies tobacco, alcohol, and illicit drug use.  Diet:   Exercise:   Home BP readings:   Wt Readings from Last 3 Encounters:  03/08/16 (!) 372 lb 6.4 oz (168.9 kg)  02/22/16 (!) 378 lb 12.8 oz (171.8 kg)  02/04/16 (!) 374 lb 1.9 oz (169.7 kg)   BP Readings from Last 3 Encounters:  03/08/16 118/84  02/22/16 132/86  02/04/16 120/70   Pulse Readings from Last 3 Encounters:  03/08/16 88  02/22/16 96  02/04/16 75    Renal function: Estimated Creatinine Clearance: 131.8 mL/min (by C-G formula based on SCr of 0.95 mg/dL).  Past Medical History:  Diagnosis Date  . Asthma   . Automatic implantable cardioverter-defibrillator in situ   . Back pain   . Chronic systolic CHF (congestive heart failure), NYHA class 3 (HCC)     . Complete heart block [facility])    s/p PPM 1997 with upgrade to AICD 2011  . Depression   . Dilated aortic root (HCC)    40mm ascending aortic root  . DM (diabetes mellitus) (HCC)   . Glaucoma   . HTN (hypertension)   . Hyperlipidemia   . Nonischemic cardiomyopathy (HCC)    EF of 15-20% by echo 2015 and 20% by nuclear stress test 2017  . Obesity   . Sarcoid (HCC)   . Sleep apnea 06-28-12   no cpap used-unable to tolerate  . SVT (supraventricular tachycardia) [facility])    s/p ablation    Current Outpatient Prescriptions on File Prior to Visit  Medication Sig Dispense Refill  . acetaminophen (TYLENOL) 650 MG CR tablet Take 650 mg by mouth every 8 (eight) hours as needed for pain.    Marland Kitchen albuterol (PROVENTIL HFA;VENTOLIN HFA) 108 (90 BASE) MCG/ACT inhaler Inhale 2 puffs into the lungs every 6 (six) hours as needed. For shortness of breath    . aspirin 81 MG tablet Take 81 mg by mouth daily.    . budesonide (PULMICORT FLEXHALER) 180 MCG/ACT inhaler Inhale 2 puffs into the lungs 2 (two) times daily. 1 Inhaler 5  . carvedilol (COREG) 25 MG tablet Take 1 tablet (25  mg total) by mouth 2 (two) times daily. 180 tablet 3  . fluticasone (FLOVENT HFA) 110 MCG/ACT inhaler Inhale 2 puffs into the lungs 2 (two) times daily. 1 Inhaler 11  . furosemide (LASIX) 40 MG tablet Take 1 tablet (40 mg total) by mouth daily. *Please call and schedule a one year follow up appointment with Dr Ladona Ridgel* 90 tablet 0  . insulin glargine (LANTUS) 100 UNIT/ML injection Inject 50 Units into the skin 2 (two) times daily.     . IRON PO Take 1 tablet by mouth as directed. OTC-Patient unsure of dosage     . Liraglutide (VICTOZA) 18 MG/3ML SOPN Inject 1.2 mg into the skin daily.    . methocarbamol (ROBAXIN) 500 MG tablet Take 500 mg by mouth 3 (three) times daily as needed for pain.  2  . sacubitril-valsartan (ENTRESTO) 49-51 MG Take 1 tablet by mouth 2 (two) times daily. 60 tablet 6  . simvastatin (ZOCOR) 40 MG tablet Take 40 mg by  mouth every evening.    . tadalafil (CIALIS) 10 MG tablet Take 1 tablet (10 mg total) by mouth daily as needed for erectile dysfunction. 10 tablet 3  . vitamin B-12 (CYANOCOBALAMIN) 250 MCG tablet Take 250 mcg by mouth daily.    . vitamin C (ASCORBIC ACID) 500 MG tablet Take 500 mg by mouth daily.     No current facility-administered medications on file prior to visit.     Allergies  Allergen Reactions  . Penicillins Other (See Comments)    dizziness     Assessment/Plan:

## 2016-03-24 ENCOUNTER — Ambulatory Visit (INDEPENDENT_AMBULATORY_CARE_PROVIDER_SITE_OTHER): Payer: Medicare Other | Admitting: Pharmacist

## 2016-03-24 VITALS — BP 116/76 | HR 70 | Ht 71.0 in | Wt 376.0 lb

## 2016-03-24 DIAGNOSIS — I428 Other cardiomyopathies: Secondary | ICD-10-CM

## 2016-03-24 DIAGNOSIS — I5022 Chronic systolic (congestive) heart failure: Secondary | ICD-10-CM

## 2016-03-24 DIAGNOSIS — I10 Essential (primary) hypertension: Secondary | ICD-10-CM

## 2016-03-24 DIAGNOSIS — I77810 Thoracic aortic ectasia: Secondary | ICD-10-CM

## 2016-03-24 MED ORDER — SACUBITRIL-VALSARTAN 97-103 MG PO TABS: 1.0000 | ORAL_TABLET | Freq: Two times a day (BID) | ORAL | 11 refills | Status: DC

## 2016-03-24 NOTE — Progress Notes (Signed)
Patient ID: [name]                 DOB: July 06, 1955                      MRN: [id number]     HPI: [name] is a 61 y.o. male referred by Dr. [name] to HTN clinic for HF medication optimization. PMH is significant for nonischemic DCM (normal coronary arteries by cath 1996), Sarcoidosis, chronic systolic CHF with LVEF 20% by nuclear stress test in 2017, HTN, CHB s/p dual chamber AICD and SVT s/p ablation.His Entresto dose was increased and pt reported feeling better with more energy. His SOB, PND, and orthopnea resolved. However, pt misunderstood directions and stopped his Coreg when he increased his Entresto dose. He was advised to resume therapy and presents today for follow up.  Chronic SOB from sarcoidosis is stable. Chronic LE edema has resolved on diuretics. Has been watching his diet. He has a wrist cuff and checks his BP occasionally, wrist cuff is a bit too small though. He cannot recall any readings over the past few weeks and doesn't think he has checked his BP since restarting carvedilol. He is weighing himself daily and watching his fluid and salt intake.  Current HTN meds: carvedilol 25mg  BID, Entresto 49-51mg  BID, furosemide 40mg  daily  BP goal: <130/64mmHg  Family History: The patient's family history includes Alzheimer's disease in his mother; CVA in his father; Diabetes in his brother, mother, and sister; Epilepsy in his sister; Heart failure in his father  Social History: Denies tobacco use, occasional alcohol use, denies illicit drug use.  Diet: He is watching his fluid and salt intake.  Exercise:  Wants to start going to the gym again, has been relatively inactive over winter.   Wt Readings from Last 3 Encounters:  03/08/16 (!) 372 lb 6.4 oz (168.9 kg)  02/22/16 (!) 378 lb 12.8 oz (171.8 kg)  02/04/16 (!) 374 lb 1.9 oz (169.7 kg)   BP Readings from Last 3 Encounters:  03/08/16 118/84  02/22/16 132/86  02/04/16 120/70   Pulse Readings from Last 3  Encounters:  03/08/16 88  02/22/16 96  02/04/16 75    Renal function: CrCl cannot be calculated (Unknown ideal weight.).  Past Medical History:  Diagnosis Date  . Asthma   . Automatic implantable cardioverter-defibrillator in situ   . Back pain   . Chronic systolic CHF (congestive heart failure), NYHA class 3 (HCC)   . Complete heart block [facility])    s/p PPM 1997 with upgrade to AICD 2011  . Depression   . Dilated aortic root (HCC)    40mm ascending aortic root  . DM (diabetes mellitus) (HCC)   . Glaucoma   . HTN (hypertension)   . Hyperlipidemia   . Nonischemic cardiomyopathy (HCC)    EF of 15-20% by echo 2015 and 20% by nuclear stress test 2017  . Obesity   . Sarcoid (HCC)   . Sleep apnea 06-28-12   no cpap used-unable to tolerate  . SVT (supraventricular tachycardia) [facility])    s/p ablation    Current Outpatient Prescriptions on File Prior to Visit  Medication Sig Dispense Refill  . acetaminophen (TYLENOL) 650 MG CR tablet Take 650 mg by mouth every 8 (eight) hours as needed for pain.    Marland Kitchen albuterol (PROVENTIL HFA;VENTOLIN HFA) 108 (90 BASE) MCG/ACT inhaler Inhale 2 puffs into the lungs every 6 (six) hours as needed. For shortness of  breath    . aspirin 81 MG tablet Take 81 mg by mouth daily.    . budesonide (PULMICORT FLEXHALER) 180 MCG/ACT inhaler Inhale 2 puffs into the lungs 2 (two) times daily. 1 Inhaler 5  . carvedilol (COREG) 25 MG tablet Take 1 tablet (25 mg total) by mouth 2 (two) times daily. 180 tablet 3  . fluticasone (FLOVENT HFA) 110 MCG/ACT inhaler Inhale 2 puffs into the lungs 2 (two) times daily. 1 Inhaler 11  . furosemide (LASIX) 40 MG tablet Take 1 tablet (40 mg total) by mouth daily. *Please call and schedule a one year follow up appointment with Dr Ladona Ridgel* 90 tablet 0  . insulin glargine (LANTUS) 100 UNIT/ML injection Inject 50 Units into the skin 2 (two) times daily.     . IRON PO Take 1 tablet by mouth as directed. OTC-Patient unsure of dosage     .  Liraglutide (VICTOZA) 18 MG/3ML SOPN Inject 1.2 mg into the skin daily.    . methocarbamol (ROBAXIN) 500 MG tablet Take 500 mg by mouth 3 (three) times daily as needed for pain.  2  . sacubitril-valsartan (ENTRESTO) 49-51 MG Take 1 tablet by mouth 2 (two) times daily. 60 tablet 6  . simvastatin (ZOCOR) 40 MG tablet Take 40 mg by mouth every evening.    . tadalafil (CIALIS) 10 MG tablet Take 1 tablet (10 mg total) by mouth daily as needed for erectile dysfunction. 10 tablet 3  . vitamin B-12 (CYANOCOBALAMIN) 250 MCG tablet Take 250 mcg by mouth daily.    . vitamin C (ASCORBIC ACID) 500 MG tablet Take 500 mg by mouth daily.     No current facility-administered medications on file prior to visit.     Allergies  Allergen Reactions  . Penicillins Other (See Comments)    dizziness     Assessment/Plan:  1. Hypertension/heart failure medication optimization: BP controlled at 116/76 and BMET stable. Will increase Entresto to 97-103mg  and continue carvedilol 25mg  BID. Will recheck BMET in 1 week. Advised pt to check his BP at home to make sure he doesn't experience hypotension. He will f/u with Ronie Spies in 3 weeks as scheduled. Would recommend starting spironolactone 12.5mg  for mortality benefit if BP can tolerate it.   Megan E. Supple, PharmD, CPP, BCACP [facility] [address] [place], [place] [address] Phone: [phone]; Fax: [phone] 03/24/2016 12:07 PM

## 2016-03-24 NOTE — Patient Instructions (Addendum)
Increase Entresto to the 97-103mg  dose and take this twice a day. I sent in a prescription to your pharmacy. Continue your other medications.  Follow up with blood work in 1 week on Friday March 23rd. Come in any time after 7:30am, you do not need to be fasting.  Follow up with Ronie Spies as scheduled on April 5th.

## 2016-03-28 ENCOUNTER — Ambulatory Visit: Payer: Medicare Other | Admitting: Internal Medicine

## 2016-04-01 ENCOUNTER — Other Ambulatory Visit (INDEPENDENT_AMBULATORY_CARE_PROVIDER_SITE_OTHER): Payer: Medicare Other

## 2016-04-01 DIAGNOSIS — I10 Essential (primary) hypertension: Secondary | ICD-10-CM | POA: Diagnosis not present

## 2016-04-02 LAB — BASIC METABOLIC PANEL
BUN/Creatinine Ratio: 9 — ABNORMAL LOW (ref 10–24)
BUN: 8 mg/dL (ref 8–27)
CALCIUM: 9.9 mg/dL (ref 8.6–10.2)
CHLORIDE: 102 mmol/L (ref 96–106)
CO2: 25 mmol/L (ref 18–29)
Creatinine, Ser: 0.93 mg/dL (ref 0.76–1.27)
GFR calc Af Amer: 103 mL/min/{1.73_m2} (ref >59)
GFR, EST NON AFRICAN AMERICAN: 89 mL/min/{1.73_m2} (ref >59)
Glucose: 131 mg/dL — ABNORMAL HIGH (ref 65–99)
Potassium: 4.2 mmol/L (ref 3.5–5.2)
Sodium: 140 mmol/L (ref 134–144)

## 2016-04-14 ENCOUNTER — Ambulatory Visit: Payer: Medicare Other | Admitting: Physician Assistant

## 2016-05-05 ENCOUNTER — Ambulatory Visit: Payer: Medicare Other | Admitting: Physician Assistant

## 2016-05-12 ENCOUNTER — Encounter: Payer: Self-pay | Admitting: Internal Medicine

## 2016-05-12 ENCOUNTER — Ambulatory Visit (INDEPENDENT_AMBULATORY_CARE_PROVIDER_SITE_OTHER): Payer: Medicare Other | Admitting: Internal Medicine

## 2016-05-12 VITALS — BP 118/82 | HR 74 | Ht 70.0 in | Wt 376.0 lb

## 2016-05-12 DIAGNOSIS — I442 Atrioventricular block, complete: Secondary | ICD-10-CM | POA: Diagnosis not present

## 2016-05-12 DIAGNOSIS — I10 Essential (primary) hypertension: Secondary | ICD-10-CM

## 2016-05-12 DIAGNOSIS — I5022 Chronic systolic (congestive) heart failure: Secondary | ICD-10-CM

## 2016-05-12 NOTE — Progress Notes (Signed)
HPI [name] returns today for followup. He is a very pleasant 61 year old man with morbid obesity, a nonischemic cardiomyopathy, complete heart block, status post ICD implantation. When his initial ICD was placed, the patient was unable to receive a left ventricular lead. In the interim, he has been stable. He denies chest pain but has had some worsening dyspnea. He admits to dietary indiscretion. He has minimal peripheral edema. He denies syncope. He is frustrated by his inability to lose and keep off weight. He admits to dietary indiscretion. Allergies  Allergen Reactions  . Penicillins Other (See Comments)    dizziness     Current Outpatient Prescriptions  Medication Sig Dispense Refill  . acetaminophen (TYLENOL) 650 MG CR tablet Take 650 mg by mouth every 8 (eight) hours as needed for pain.    Marland Kitchen albuterol (PROVENTIL HFA;VENTOLIN HFA) 108 (90 BASE) MCG/ACT inhaler Inhale 2 puffs into the lungs every 6 (six) hours as needed. For shortness of breath    . aspirin 81 MG tablet Take 81 mg by mouth daily.    . budesonide (PULMICORT FLEXHALER) 180 MCG/ACT inhaler Inhale 2 puffs into the lungs 2 (two) times daily. 1 Inhaler 5  . carvedilol (COREG) 25 MG tablet Take 1 tablet (25 mg total) by mouth 2 (two) times daily. 180 tablet 3  . fluticasone (FLOVENT HFA) 110 MCG/ACT inhaler Inhale 2 puffs into the lungs 2 (two) times daily. 1 Inhaler 11  . furosemide (LASIX) 40 MG tablet Take 1 tablet (40 mg total) by mouth daily. *Please call and schedule a one year follow up appointment with Dr Ladona Ridgel* 90 tablet 0  . insulin glargine (LANTUS) 100 UNIT/ML injection Inject 50 Units into the skin 2 (two) times daily.     . IRON PO Take 1 tablet by mouth as directed. OTC-Patient unsure of dosage     . Liraglutide (VICTOZA) 18 MG/3ML SOPN Inject 1.2 mg into the skin daily.    . methocarbamol (ROBAXIN) 500 MG tablet Take 500 mg by mouth 3 (three) times daily as needed for pain.  2  . sacubitril-valsartan  (ENTRESTO) 97-103 MG Take 1 tablet by mouth 2 (two) times daily. 60 tablet 11  . simvastatin (ZOCOR) 40 MG tablet Take 40 mg by mouth every evening.    . tadalafil (CIALIS) 10 MG tablet Take 1 tablet (10 mg total) by mouth daily as needed for erectile dysfunction. 10 tablet 3  . vitamin B-12 (CYANOCOBALAMIN) 250 MCG tablet Take 250 mcg by mouth daily.    . vitamin C (ASCORBIC ACID) 500 MG tablet Take 500 mg by mouth daily.     No current facility-administered medications for this visit.      Past Medical History:  Diagnosis Date  . Asthma   . Automatic implantable cardioverter-defibrillator in situ   . Back pain   . Chronic systolic CHF (congestive heart failure), NYHA class 3 (HCC)   . Complete heart block [facility])    s/p PPM 1997 with upgrade to AICD 2011  . Depression   . Dilated aortic root (HCC)    41mm ascending aortic root  . DM (diabetes mellitus) (HCC)   . Glaucoma   . HTN (hypertension)   . Hyperlipidemia   . Nonischemic cardiomyopathy (HCC)    EF of 15-20% by echo 2015 and 20% by nuclear stress test 2017  . Obesity   . Sarcoid   . Sleep apnea 06-28-12   no cpap used-unable to tolerate  . SVT (supraventricular tachycardia) (HCC)  s/p ablation    ROS:   All systems reviewed and negative except as noted in the HPI.   Past Surgical History:  Procedure Laterality Date  . COLONOSCOPY WITH PROPOFOL N/A 07/17/2012   Procedure: COLONOSCOPY WITH PROPOFOL;  Surgeon: Charolett Bumpers, MD;  Location: [facility];  Service: Endoscopy;  Laterality: N/A;  . HEMORRHOID SURGERY     early 20's  . PACEMAKER INSERTION    . TONSILLECTOMY       Family History  Problem Relation Age of Onset  . Diabetes Mother   . Alzheimer's disease Mother   . CVA Father   . Heart failure Father   . Epilepsy Sister   . Diabetes Brother   . Diabetes Sister      Social History   Social History  . Marital status: Married    Spouse name: N/A  . Number of children: N/A  . Years of  education: N/A   Occupational History  . Not on file.   Social History Main Topics  . Smoking status: Never Smoker  . Smokeless tobacco: Never Used  . Alcohol use Yes     Comment: occasionally  . Drug use: No  . Sexual activity: Yes   Other Topics Concern  . Not on file   Social History Narrative  . No narrative on file     BP 118/82   Pulse 74   Ht 5\' 10"  (1.778 m)   Wt (!) 376 lb (170.6 kg)   SpO2 95%   BMI 53.95 kg/m   Physical Exam:  Morbidly obese appearing NAD HEENT: Unremarkable Neck:  7 cm JVD, no thyromegally Lungs:  Clear except for scattered basilar rales. Decreased breath sounds. HEART:  Regular rate rhythm, no murmurs, no rubs, no clicks, decreased heart sounds. Abd:  soft, obese,positive bowel sounds, no organomegally, no rebound, no guarding Ext:  2 plus pulses, 1+ edema, no cyanosis, no clubbing Skin:  No rashes no nodules Neuro:  CN II through XII intact, motor grossly intact  ECG - NSR with RV pacing with a QRS of 200 ms  DEVICE  Normal device function.  See PaceArt for details.   Assess/Plan: 1. Chronic systolic heart failure -his symptoms are class 2-3. He admits to dietary indiscretion and we will continue his lasix to 40 mg daily 2. Obesity - i have strongly encouraged him to lose weight.  3. ICD - his [employer] device is working normally. Will recheck in several months. 4. Complete heart block - his escape is aroung 35/min. He will likely need attempted biv upgrade when he reaches ERI. His bundle pacing would be another option.  Leonia Reeves.D.

## 2016-05-12 NOTE — Patient Instructions (Signed)
Medication Instructions:  Your physician recommends that you continue on your current medications as directed. Please refer to the Current Medication list given to you today.   Labwork: None ordered  Testing/Procedures: None ordered  Follow-Up: Remote monitoring is used to monitor your ICD from home. This monitoring reduces the number of office visits required to check your device to one time per year. It allows [place] to keep an eye on the functioning of your device to ensure it is working properly. You are scheduled for a device check from home on 08/11/16. You may send your transmission at any time that day. If you have a wireless device, the transmission will be sent automatically. After your physician reviews your transmission, you will receive a postcard with your next transmission date.    Your physician wants you to follow-up in: 1 year with Dr. Ladona Ridgel. You will receive a reminder letter in the mail two months in advance. If you don't receive a letter, please call our office to schedule the follow-up appointment.   Any Other Special Instructions Will Be Listed Below (If Applicable).     If you need a refill on your cardiac medications before your next appointment, please call your pharmacy.

## 2016-05-13 ENCOUNTER — Ambulatory Visit (INDEPENDENT_AMBULATORY_CARE_PROVIDER_SITE_OTHER): Payer: Medicare Other | Admitting: Internal Medicine

## 2016-05-13 ENCOUNTER — Encounter: Payer: Self-pay | Admitting: Internal Medicine

## 2016-05-13 VITALS — BP 126/80 | HR 92 | Ht 70.0 in | Wt 373.0 lb

## 2016-05-13 DIAGNOSIS — Z862 Personal history of diseases of the blood and blood-forming organs and certain disorders involving the immune mechanism: Secondary | ICD-10-CM | POA: Diagnosis not present

## 2016-05-13 DIAGNOSIS — J4530 Mild persistent asthma, uncomplicated: Secondary | ICD-10-CM | POA: Diagnosis not present

## 2016-05-13 LAB — CUP PACEART INCLINIC DEVICE CHECK
Battery Remaining Percentage: 27 %
Brady Statistic RV Percent Paced: 99 %
Date Time Interrogation Session: 20180504170557
HighPow Impedance: 44 Ohm
Implantable Lead Implant Date: 19961213
Implantable Lead Implant Date: 20111202
Implantable Lead Location: 753860
Lead Channel Impedance Value: 360 Ohm
Lead Channel Pacing Threshold Amplitude: 0.75 V
Lead Channel Pacing Threshold Pulse Width: 0.5 ms
Lead Channel Pacing Threshold Pulse Width: 0.5 ms
Lead Channel Sensing Intrinsic Amplitude: 1.9 mV
Lead Channel Sensing Intrinsic Amplitude: 12 mV
Lead Channel Setting Pacing Amplitude: 2 V
Lead Channel Setting Pacing Amplitude: 2 V
Lead Channel Setting Sensing Sensitivity: 2 mV
MDC IDC LEAD LOCATION: 753859
MDC IDC MSMT LEADCHNL RA IMPEDANCE VALUE: 410 Ohm
MDC IDC MSMT LEADCHNL RV PACING THRESHOLD AMPLITUDE: 0.75 V
MDC IDC PG IMPLANT DT: 20111202
MDC IDC SET LEADCHNL RV PACING PULSEWIDTH: 0.5 ms
MDC IDC STAT BRADY RA PERCENT PACED: 3.6 %
Pulse Gen Serial Number: 629155

## 2016-05-13 MED ORDER — FLUTICASONE FUROATE 100 MCG/ACT IN AEPB: 1.0000 | INHALATION_SPRAY | Freq: Every day | RESPIRATORY_TRACT | 11 refills | Status: DC

## 2016-05-13 NOTE — Addendum Note (Signed)
Addended by: Sheran Luz on: 05/13/2016 12:31 PM   Modules accepted: Orders

## 2016-05-13 NOTE — Patient Instructions (Signed)
ICD-9-CM ICD-10-CM   1. History of sarcoidosis V12.29 Z86.2   2. Mild persistent asthma, uncomplicated 493.90 J45.30    Change flovent to ARNUITY to once daily scheduled Use albuterol as needed Monitor shortness of breath and cough with this; call if not working for you  followup 6-9 months or sooner if needed

## 2016-05-13 NOTE — Progress Notes (Signed)
Subjective:     Patient ID: [name], male   DOB: 12-Jul-1955, 61 y.o.   MRN: [id number]  HPI  Dictation #1 [id number]  [id number]  PCP Georgann Housekeeper, MD  HPI  IOV 05/22/2015  Chief Complaint  Patient presents with  . Pulmonary Consult    Referred by [name]; Asthma/ Sarcoidosis; SOB; chest tightness.  non-productive cough.    61 year-old morbidly obese male with nonischemic cardiac myopathy and chronic back problems and sleep apnea followed by Dr. [name] referred for evaluation of possible sarcoidosis and asthma.  He migrated here in 62 from [place]. At that time he was in his 30s. He says he had like myalgias and was diagnosed with pulmonary sarcoidosis by Dr. [name] at [facility]. He remembers having a bronchoscopy and transbronchial biopsy. Further details unknown. Other than the fact he took prednisone for few to several months. He says over the next few years as sarcoidosis resolved according to follow at [facility]. And then some 15 years ago was diagnosed with nonischemic cardiomyopathy and his been disabled. Then some 20 years ago was diagnosed to have asthma by his primary care physician. He had asthma when away but in the last year or so it has come back. For the last 6 months he been using albuterol rescue at least several times daily. It is associated with wheeze. Symptoms of asthma include dyspnea on exertion and weakness at rest. Wheezing is made worse by cold temperature and relieved by albuterol. Moderate in intensity. There is no fever or sputum production.   Asthma control questionnaire.: He never wakes up in the middle of the night because of asthma symptoms. When he wakes up in the morning he perceives very mild symptoms. He feels very mildly limited because of asthma with his activities. He experiences a moderate amount of shortness of breath with exertion relieved by rest but also made worse by cold temperature and relieved by albuterol  which he perceives is because of asthma. He wheezes a moderate amount of the time and uses albuterol for rescue at least 3-4 puffs daily. 5. question as 1.6 and shows active symptoms.   FEno  - 29ppb and borderline - this is with patient taking albuterol as needed. He is not on inhaled steroids. He is noted to be a nonspecific beta blocker carvedilol. Visualization of 5 chest x-ray from 2007 all the way to 2016 show clear lung fields without any mediastinal adenopathy. He does not have any CT chest blood work review shows hemoglobin 12 g percent and normal creatinine.     06/23/2015 follow up Returns for a one-month follow-up. Patient was seen last visit for pulmonary consult for possible sarcoidosis and asthma. Patient was being seen in [facility] by pulmonologist. Diagnosed with sarcoid years ago. He has been on and off of prednisone several times in the past.  Last ov  , FENO was 29. Patient was set up for a CT chest that showed mild irregular thickening along the right major fissure, scattered pulmonary nodules in the right lung. Parenchymal bands in the lingula and left lower lobe. These could represent nonspecific findings associated with sarcoid. No other evidence of interstitial lung disease was noted. Mild subcarinal and bilateral hilar adenopathy. Pulmonary function test performed today showed an FEV1 at 62%, ratio 83, FVC 58%, no significant bronchodilator response, total lung capacity 63%, DLCO 70% Patient was started on Pulmicort last visit. Patient feels that this has improved his shortness of breath and cough.  OV 09/23/2015  Chief Complaint  Patient presents with  . Follow-up    Pt states his SOBhas improved since last OV in 06/2015. Pt c/o prod cough with clear mucus. Pt denies CP/tightness and f/c/s.    20o-year-old morbidly obese male with a history of sarcoidosis in the past. Originally seen in May 2017 for wheezing. Evaluation showed that he had burned out sarcoidosis.  Exhaled nitric oxide was slightly elevated at 29 ppb. Restart him on inhaled steroids. He is taking Flovent. This significantly improved his symptoms. Currently he tells me that he continues to maintain a wheeze free life. His only symptom is mild occasional cough when he lies down. At this point in time he does not want therapy for this. He thinks he can prop up his head that might help the cough. He will have flu shot today.      OV 05/13/2016  Chief Complaint  Patient presents with  . Follow-up    Pt states his breathing is doing well. Pt states he has an intermittent dry cough at night. Pt denies CP/tightness and f/c/s.    68-year-old morbidly obese male with a history of burn doubt sarcoidosis in the past. Now with a diagnosis of mild persistent asthma based on borderline exhaled nitric oxide and therapeutic response to Flovent in terms of his dyspnea wheeze and cough. At last visit he still had some residual cough for which she did not want change in therapy. He is happy with his Flovent. He is somewhat reluctant to change it. Nevertheless he is only taking his Flovent once daily. He does have cough at night as a dry cough. However it is mild. He doesn't seem bothered by it. Of note he does have sleep apnea which is managed by his primary care physician but he says that he is noncompliant using his CPAP.    has a past medical history of Asthma; Automatic implantable cardioverter-defibrillator in situ; Back pain; Chronic systolic CHF (congestive heart failure), NYHA class 3 (HCC); Complete heart block (HCC); Depression; Dilated aortic root (HCC); DM (diabetes mellitus) (HCC); Glaucoma; HTN (hypertension); Hyperlipidemia; Nonischemic cardiomyopathy (HCC); Obesity; Sarcoid; Sleep apnea (06-28-12); and SVT (supraventricular tachycardia) (HCC).   reports that he has never smoked. He has never used smokeless tobacco.  Past Surgical History:  Procedure Laterality Date  . COLONOSCOPY WITH PROPOFOL  N/A 07/17/2012   Procedure: COLONOSCOPY WITH PROPOFOL;  Surgeon: Charolett Bumpers, MD;  Location: [facility];  Service: Endoscopy;  Laterality: N/A;  . HEMORRHOID SURGERY     early 20's  . PACEMAKER INSERTION    . TONSILLECTOMY      Allergies  Allergen Reactions  . Penicillins Other (See Comments)    dizziness    Immunization History  Administered Date(s) Administered  . Influenza, High Dose Seasonal PF 09/23/2015  . Influenza-Unspecified 11/11/2014  . Pneumococcal-Unspecified 01/11/2015    Family History  Problem Relation Age of Onset  . Diabetes Mother   . Alzheimer's disease Mother   . CVA Father   . Heart failure Father   . Epilepsy Sister   . Diabetes Brother   . Diabetes Sister      Current Outpatient Prescriptions:  .  acetaminophen (TYLENOL) 650 MG CR tablet, Take 650 mg by mouth every 8 (eight) hours as needed for pain., Disp: , Rfl:  .  albuterol (PROVENTIL HFA;VENTOLIN HFA) 108 (90 BASE) MCG/ACT inhaler, Inhale 2 puffs into the lungs every 6 (six) hours as needed. For shortness of breath, Disp: , Rfl:  .  aspirin 81 MG tablet, Take 81 mg by mouth daily., Disp: , Rfl:  .  carvedilol (COREG) 25 MG tablet, Take 1 tablet (25 mg total) by mouth 2 (two) times daily., Disp: 180 tablet, Rfl: 3 .  fluticasone (FLOVENT HFA) 110 MCG/ACT inhaler, Inhale 2 puffs into the lungs 2 (two) times daily., Disp: 1 Inhaler, Rfl: 11 .  furosemide (LASIX) 40 MG tablet, Take 1 tablet (40 mg total) by mouth daily. *Please call and schedule a one year follow up appointment with Dr Ladona Ridgel*, Disp: 90 tablet, Rfl: 0 .  insulin glargine (LANTUS) 100 UNIT/ML injection, Inject 50 Units into the skin 2 (two) times daily. , Disp: , Rfl:  .  IRON PO, Take 1 tablet by mouth as directed. OTC-Patient unsure of dosage , Disp: , Rfl:  .  Liraglutide (VICTOZA) 18 MG/3ML SOPN, Inject 1.2 mg into the skin daily., Disp: , Rfl:  .  methocarbamol (ROBAXIN) 500 MG tablet, Take 500 mg by mouth 3 (three) times  daily as needed for pain., Disp: , Rfl: 2 .  sacubitril-valsartan (ENTRESTO) 97-103 MG, Take 1 tablet by mouth 2 (two) times daily., Disp: 60 tablet, Rfl: 11 .  simvastatin (ZOCOR) 40 MG tablet, Take 40 mg by mouth every evening., Disp: , Rfl:  .  tadalafil (CIALIS) 10 MG tablet, Take 1 tablet (10 mg total) by mouth daily as needed for erectile dysfunction., Disp: 10 tablet, Rfl: 3 .  vitamin B-12 (CYANOCOBALAMIN) 250 MCG tablet, Take 250 mcg by mouth daily., Disp: , Rfl:  .  vitamin C (ASCORBIC ACID) 500 MG tablet, Take 500 mg by mouth daily., Disp: , Rfl:     Review of Systems     Objective:   Physical Exam  Constitutional: He is oriented to person, place, and time. He appears well-developed and well-nourished. No distress.  Obese male  HENT:  Head: Normocephalic and atraumatic.  Right Ear: External ear normal.  Left Ear: External ear normal.  Mouth/Throat: Oropharynx is clear and moist. No oropharyngeal exudate.  Eyes: Conjunctivae and EOM are normal. Pupils are equal, round, and reactive to light. Right eye exhibits no discharge. Left eye exhibits no discharge. No scleral icterus.  Neck: Normal range of motion. Neck supple. No JVD present. No tracheal deviation present. No thyromegaly present.  Cardiovascular: Normal rate, regular rhythm and intact distal pulses.  Exam reveals no gallop and no friction rub.   No murmur heard. Pulmonary/Chest: Effort normal and breath sounds normal. No respiratory distress. He has no wheezes. He has no rales. He exhibits no tenderness.  Abdominal: Soft. Bowel sounds are normal. He exhibits no distension and no mass. There is no tenderness. There is no rebound and no guarding.  Musculoskeletal: Normal range of motion. He exhibits no edema or tenderness.  Lymphadenopathy:    He has no cervical adenopathy.  Neurological: He is alert and oriented to person, place, and time. He has normal reflexes. No cranial nerve deficit. Coordination normal.  Skin:  Skin is warm and dry. No rash noted. He is not diaphoretic. No erythema. No pallor.  Psychiatric: He has a normal mood and affect. His behavior is normal. Judgment and thought content normal.  Nursing note and vitals reviewed.  Vitals:   05/13/16 1152  BP: 126/80  Pulse: 92  SpO2: 96%  Weight: (!) 373 lb (169.2 kg)  Height: 5\' 10"  (1.778 m)    Estimated body mass index is 53.52 kg/m as calculated from the following:   Height as of this encounter:  5\' 10"  (1.778 m).   Weight as of this encounter: 373 lb (169.2 kg).     Assessment:       ICD-9-CM ICD-10-CM   1. History of sarcoidosis V12.29 Z86.2   2. Mild persistent asthma, uncomplicated 493.90 J45.30        Plan:        After some discussion we agreed that because he is only taking his Flovent once daily that it is hard to distinguish whether his cough is because of the Flovent because of uncontrolled inflammation. Therefore he is agreed to switch to once daily inhaled steroid ARNUITY. Marland Kitchen He will try this out and give [place] a call if it does not work. Otherwise will return to see him in 6-9 months and  as needed   He is agreeable with this plan  (> 50% of this 15 min visit spent in face to face counseling or/and coordination of care)  Dr. Kalman Shan, M.D., [facility].C.P Pulmonary and Critical Care Medicine Staff Physician [facility] Pager: [phone], If no answer or between  15:00h - 7:00h: call [phone]  05/13/2016 12:29 PM

## 2016-06-23 ENCOUNTER — Ambulatory Visit (INDEPENDENT_AMBULATORY_CARE_PROVIDER_SITE_OTHER)
Admission: RE | Admit: 2016-06-23 | Discharge: 2016-06-23 | Disposition: A | Discharge status: Home / Self Care | Payer: Medicare Other | Source: Ambulatory Visit | Attending: Adult Health | Admitting: Adult Health

## 2016-06-23 DIAGNOSIS — D869 Sarcoidosis, unspecified: Secondary | ICD-10-CM | POA: Diagnosis not present

## 2016-07-09 ENCOUNTER — Other Ambulatory Visit: Payer: Self-pay | Admitting: Internal Medicine

## 2016-07-09 DIAGNOSIS — I5022 Chronic systolic (congestive) heart failure: Secondary | ICD-10-CM

## 2016-07-22 ENCOUNTER — Telehealth: Payer: Self-pay | Admitting: *Deleted

## 2016-07-22 NOTE — Telephone Encounter (Signed)
Attempted to call patient regarding "VF" episode successfully treated with ATP x1.   N/A-mailbox is full.sss

## 2016-08-11 ENCOUNTER — Ambulatory Visit (INDEPENDENT_AMBULATORY_CARE_PROVIDER_SITE_OTHER): Payer: Medicare Other | Admitting: *Deleted

## 2016-08-11 DIAGNOSIS — I428 Other cardiomyopathies: Secondary | ICD-10-CM | POA: Diagnosis not present

## 2016-08-11 NOTE — Progress Notes (Signed)
Remote ICD transmission.   

## 2016-08-12 ENCOUNTER — Encounter: Payer: Self-pay | Admitting: Cardiology

## 2016-08-17 ENCOUNTER — Other Ambulatory Visit: Payer: Self-pay | Admitting: Internal Medicine

## 2016-08-30 ENCOUNTER — Telehealth: Payer: Self-pay | Admitting: Cardiology

## 2016-08-30 ENCOUNTER — Other Ambulatory Visit: Payer: Medicare Other | Admitting: *Deleted

## 2016-08-30 DIAGNOSIS — I5022 Chronic systolic (congestive) heart failure: Secondary | ICD-10-CM

## 2016-08-30 DIAGNOSIS — I10 Essential (primary) hypertension: Secondary | ICD-10-CM

## 2016-08-30 NOTE — Telephone Encounter (Signed)
Reiterated to patient that he should not be taking Valsartan as this medication was stopped in January when [place] was initiated. He found his AVS from that day with the instruction to stop, but he states he doesn't remember being verbally told to stop the medication so he did not. Patient understands to STOP VALSARTAN. BMET scheduled for today. Patient agrees with treatment plan.

## 2016-08-30 NOTE — Telephone Encounter (Signed)
[facility] called stating that pt is still taking Valsartan and Entresto together. I called the pt and pt stated that he has been taking both medications and that no one told him to stop. I explained to the pt per Dr. Norris Cross office note on 02/04/16, for pt to stop taking Valsartan and start taking Entresto. I advised the pt to stop taking Valsartan and just take Entresto. Pt still stated that no one told him to stop taking Valsartan, not even Dr. [name]. FYI

## 2016-08-31 LAB — BASIC METABOLIC PANEL
BUN / CREAT RATIO: 10 (ref 10–24)
BUN: 10 mg/dL (ref 8–27)
CHLORIDE: 107 mmol/L — AB (ref 96–106)
CO2: 21 mmol/L (ref 20–29)
CREATININE: 0.97 mg/dL (ref 0.76–1.27)
Calcium: 9.9 mg/dL (ref 8.6–10.2)
GFR calc non Af Amer: 84 mL/min/{1.73_m2} (ref >59)
GFR, EST AFRICAN AMERICAN: 97 mL/min/{1.73_m2} (ref >59)
Glucose: 140 mg/dL — ABNORMAL HIGH (ref 65–99)
Potassium: 4.2 mmol/L (ref 3.5–5.2)
Sodium: 144 mmol/L (ref 134–144)

## 2016-09-01 ENCOUNTER — Telehealth: Payer: Self-pay | Admitting: Internal Medicine

## 2016-09-01 NOTE — Telephone Encounter (Signed)
Spoke with patient. He wanted to let MR know that he does in fact have sarcoidosis. Advised that it had already been documented in his chart. He verbalized understanding. Nothing else needed at time of call.

## 2016-09-15 ENCOUNTER — Ambulatory Visit (INDEPENDENT_AMBULATORY_CARE_PROVIDER_SITE_OTHER): Payer: Medicare Other | Admitting: Pharmacist

## 2016-09-15 VITALS — BP 128/84 | HR 87 | Ht 70.0 in | Wt 368.8 lb

## 2016-09-15 DIAGNOSIS — I10 Essential (primary) hypertension: Secondary | ICD-10-CM

## 2016-09-15 MED ORDER — SPIRONOLACTONE 25 MG PO TABS: 12.5000 mg | ORAL_TABLET | Freq: Every day | ORAL | 11 refills | Status: DC

## 2016-09-15 NOTE — Progress Notes (Signed)
Patient ID: [name]                 DOB: 03-19-1955                      MRN: [id number]     HPI: [name] is a 61 y.o. male referred by Dr. [name] to pharmacy clinic for HF medication optimization. PMH is significant for nonischemic DCM (normal coronary arteries by cath 1996), sarcoidosis, chronic systolic CHF with LVEF 15% by echo in 02/2016, HTN, CHB s/p dual chamber AICD and SVT s/p ablation. F/u on 03/24/16 on increased Entresto dose to 49-51mg  BID and pt reported feeling better with more energy. His SOB, PND, and orthopnea had resolved. However, pt misunderstood directions and stopped his Coreg when he increased his Entresto dose. He was advised to resume therapy and presented for follow up. His BP in clinic was controlled at 116/76. His Entresto was increased to 97-103mg  BID. On 08/30/16, [facility] stating that pt was taking valsartan and Entresto together. Pt was contacted and he stated no one told him to stop his valsartan. F/u BMET was stable on duplicate therapy. Pt was advised to stop valsartan and is presenting today for f/u.   Pt presents for BP check in good spirits. Pt denies feeling faint or dizzy.  States he had tried to eat a better diet but now is back to his regular eating. He has not been exercising, but hopes to start today. Pt states he is compliant with all of his cardiac medications and confirms that he has stopped his valsartan.  Current HTN meds: carvedilol  BID, Entresto 97-103mg  BID, furosemide  daily  Previously tried: valsartan - d/c'ed when switched to Entresto BP goal: <130/66mmHg  Family History: The patient's family history includes Alzheimer's disease in his mother; CVA in his father; Diabetes in his brother, mother, and sister; Epilepsy in his sister; Heart failure in his father  Social History: Denies tobacco use, occasional alcohol use, denies illicit drug use.  Diet: States he has a hard time sticking to a diet and not eating cookies  if they are in the house. Does not add salt to his food, but cooks the majority of his food and therefore he states he knows his portion sizes are larger than they should be.  Mainly broils and bakes chicken, fish, and [place].  Does not eat a lot of red meat.   Exercise: Wants to start going to the gym again, pt states he will start back at [employer] hopefully tonight.   Home BP readings: He has a wrist cuff and checks his BP occasionally, wrist cuff is a bit too small though and his readings fluctuate.  Last home readings were in the 130's/80's.  Wt Readings from Last 3 Encounters:  05/13/16 (!) 373 lb (169.2 kg)  05/12/16 (!) 376 lb (170.6 kg)  03/24/16 (!) 376 lb (170.6 kg)   BP Readings from Last 3 Encounters:  05/13/16 126/80  05/12/16 118/82  03/24/16 116/76   Pulse Readings from Last 3 Encounters:  05/13/16 92  05/12/16 74  03/24/16 70    Renal function: CrCl cannot be calculated (Unknown ideal weight.).  Past Medical History:  Diagnosis Date  . Asthma   . Automatic implantable cardioverter-defibrillator in situ   . Back pain   . Chronic systolic CHF (congestive heart failure), NYHA class 3 (HCC)   . Complete heart block (HCC)    s/p PPM 1997 with  upgrade to AICD 2011  . Depression   . Dilated aortic root (HCC)    29mm ascending aortic root  . DM (diabetes mellitus) (HCC)   . Glaucoma   . HTN (hypertension)   . Hyperlipidemia   . Nonischemic cardiomyopathy (HCC)    EF of 15-20% by echo 2015 and 20% by nuclear stress test 2017  . Obesity   . Sarcoid   . Sleep apnea 06-28-12   no cpap used-unable to tolerate  . SVT (supraventricular tachycardia) [facility])    s/p ablation    Current Outpatient Prescriptions on File Prior to Visit  Medication Sig Dispense Refill  . acetaminophen (TYLENOL) 650 MG CR tablet Take 650 mg by mouth every 8 (eight) hours as needed for pain.    Marland Kitchen albuterol (PROVENTIL HFA;VENTOLIN HFA) 108 (90 BASE) MCG/ACT inhaler Inhale 2 puffs into the  lungs every 6 (six) hours as needed. For shortness of breath    . aspirin 81 MG tablet Take 81 mg by mouth daily.    . carvedilol (COREG) 25 MG tablet Take 1 tablet (25 mg total) by mouth 2 (two) times daily. 180 tablet 3  . Fluticasone Furoate (ARNUITY ELLIPTA) 100 MCG/ACT AEPB Inhale 1 puff into the lungs daily. 30 each 11  . furosemide (LASIX) 40 MG tablet TAKE 1 TABLET BY MOUTH EVERY DAY 90 tablet 1  . insulin glargine (LANTUS) 100 UNIT/ML injection Inject 50 Units into the skin 2 (two) times daily.     . IRON PO Take 1 tablet by mouth as directed. OTC-Patient unsure of dosage     . Liraglutide (VICTOZA) 18 MG/3ML SOPN Inject 1.2 mg into the skin daily.    . methocarbamol (ROBAXIN) 500 MG tablet Take 500 mg by mouth 3 (three) times daily as needed for pain.  2  . sacubitril-valsartan (ENTRESTO) 97-103 MG Take 1 tablet by mouth 2 (two) times daily. 60 tablet 11  . simvastatin (ZOCOR) 40 MG tablet Take 40 mg by mouth every evening.    . tadalafil (CIALIS) 10 MG tablet Take 1 tablet (10 mg total) by mouth daily as needed for erectile dysfunction. 10 tablet 3  . vitamin B-12 (CYANOCOBALAMIN) 250 MCG tablet Take 250 mcg by mouth daily.    . vitamin C (ASCORBIC ACID) 500 MG tablet Take 500 mg by mouth daily.     No current facility-administered medications on file prior to visit.     Allergies  Allergen Reactions  . Penicillins Other (See Comments)    dizziness     Assessment/Plan:  1. Hypertension/HF medication optimization: BP today was controlled at 128/84 mmHg at goal < 130/20mmHg and BMET stable. However, pt's HF medications are not optimized. Will start spironolactone 12.5mg  daily and continue Coreg 25mg  BID, Lasix 40mg  daily, and Entresto 97-103mg . F/u for BMET in 1 week and BP check in 2-3 weeks.   Patient seen by: Dutch Quint, PharmD Student  Megan E. Supple, PharmD, CPP, BCACP [facility] [address] [place], [place] [address] Phone: [phone]; Fax: [phone] 09/15/2016 3:10 PM

## 2016-09-15 NOTE — Patient Instructions (Addendum)
It was nice to see you today! Your blood pressure was at goal less than 130/80.   Start taking spironolactone 12.5mg  (1/2 tablet of 25mg ) once daily.   Please continue taking all of your other medications.   We will recheck your labs in 1 week and follow-up for blood pressure in 2-3 weeks.

## 2016-09-19 LAB — CUP PACEART REMOTE DEVICE CHECK
Battery Remaining Longevity: 22 mo
Battery Remaining Percentage: 23 %
Brady Statistic AP VS Percent: 1 %
Brady Statistic AS VP Percent: 95 %
Brady Statistic AS VS Percent: 1 %
Brady Statistic RA Percent Paced: 3.9 %
HighPow Impedance: 42 Ohm
Implantable Lead Implant Date: 19961213
Implantable Lead Location: 753859
Implantable Lead Model: 5524
Implantable Lead Model: 7121
Lead Channel Impedance Value: 410 Ohm
Lead Channel Pacing Threshold Amplitude: 0.75 V
Lead Channel Pacing Threshold Pulse Width: 0.5 ms
Lead Channel Pacing Threshold Pulse Width: 0.5 ms
Lead Channel Sensing Intrinsic Amplitude: 1.5 mV
Lead Channel Setting Pacing Amplitude: 2 V
MDC IDC LEAD IMPLANT DT: 20111202
MDC IDC LEAD LOCATION: 753860
MDC IDC MSMT BATTERY VOLTAGE: 2.8 V
MDC IDC MSMT LEADCHNL RV IMPEDANCE VALUE: 360 Ohm
MDC IDC MSMT LEADCHNL RV PACING THRESHOLD AMPLITUDE: 0.75 V
MDC IDC MSMT LEADCHNL RV SENSING INTR AMPL: 12 mV
MDC IDC PG IMPLANT DT: 20111202
MDC IDC PG SERIAL: 629155
MDC IDC SESS DTM: 20180802084620
MDC IDC SET LEADCHNL RA PACING AMPLITUDE: 2 V
MDC IDC SET LEADCHNL RV PACING PULSEWIDTH: 0.5 ms
MDC IDC SET LEADCHNL RV SENSING SENSITIVITY: 2 mV
MDC IDC STAT BRADY AP VP PERCENT: 4.3 %
MDC IDC STAT BRADY RV PERCENT PACED: 99 %

## 2016-09-23 ENCOUNTER — Other Ambulatory Visit: Payer: Medicare Other | Admitting: *Deleted

## 2016-09-23 DIAGNOSIS — I10 Essential (primary) hypertension: Secondary | ICD-10-CM

## 2016-09-25 LAB — BASIC METABOLIC PANEL
BUN / CREAT RATIO: 8 — AB (ref 10–24)
BUN: 8 mg/dL (ref 8–27)
CO2: 21 mmol/L (ref 20–29)
CREATININE: 1.02 mg/dL (ref 0.76–1.27)
Calcium: 10.2 mg/dL (ref 8.6–10.2)
Chloride: 105 mmol/L (ref 96–106)
GFR, EST AFRICAN AMERICAN: 91 mL/min/{1.73_m2} (ref >59)
GFR, EST NON AFRICAN AMERICAN: 79 mL/min/{1.73_m2} (ref >59)
Glucose: 125 mg/dL — ABNORMAL HIGH (ref 65–99)
Potassium: 4.2 mmol/L (ref 3.5–5.2)
Sodium: 142 mmol/L (ref 134–144)

## 2016-10-03 ENCOUNTER — Ambulatory Visit (INDEPENDENT_AMBULATORY_CARE_PROVIDER_SITE_OTHER): Payer: Medicare Other | Admitting: Pharmacist

## 2016-10-03 VITALS — BP 110/68 | HR 68

## 2016-10-03 DIAGNOSIS — I5022 Chronic systolic (congestive) heart failure: Secondary | ICD-10-CM | POA: Diagnosis not present

## 2016-10-03 DIAGNOSIS — I10 Essential (primary) hypertension: Secondary | ICD-10-CM

## 2016-10-03 DIAGNOSIS — Z23 Encounter for immunization: Secondary | ICD-10-CM

## 2016-10-03 NOTE — Progress Notes (Signed)
Patient ID: [name]                 DOB: Aug 08, 1955                      MRN: [id number]     HPI: [name] is a 61 y.o. male referred by Dr. [name] to pharmacy clinic for HTN/HF medication optimization. PMH is significant for nonischemic DCM (normal coronary arteries by cath 1996), sarcoidosis, chronic systolic CHF with LVEF 15% by echo in 02/2016, HTN, CHB s/p dual chamber AICD and SVT s/p ablation. F/u on 03/24/16 on increased Entresto dose to 49-51mg  BID and pt reported feeling better with more energy. His SOB, PND, and orthopnea had resolved. However, pt misunderstood directions and stopped his Coreg when he increased his Entresto dose. His Entresto was then increased to 97-103mg  BID. On [facility]/16, [facility] stating that pt was taking valsartan and Entresto together for multiple months. Pt was advised to stop his valsartan and only take Entresto. At his last HTN visit on 09/15/16, blood pressure was well-controlled at 128/84 mmHg. Spironolactone 12.5 mg daily was initiated. A BMET was obtained a week later and was stable. --- Pt presents today for a HTN/HF optimization clinic visit. Pt denies feeling faint or dizzy and also denies recent falls. Pt states he is compliant with his HTN/HF medications but inquired about a pill box. He was educated that a pillbox can help him with compliance and that he can [facility]. During medication reconciliation he admitted that he only takes his ASA every once in awhile due to fear of bleeding. He was educated that ASA is not considered an anticoagulant and that he should take it daily as prescribed for cardiac benefits. He states that the spironolactone is making him void more often but that it is improving and is not severe enough to want to discontinue the medication. He is comfortable with continuing the current regimen and continued compliance was encouraged. When questioned how he breaks his spironolactone he states that he bites the  pills in half, he was instructed to go purchase a pill cutter to get a more precise daily dose. He is making strides to watch his diet and is also inquiring about a [employer], this was encouraged.   Current HTN meds: carvedilol 25mg  BID, Entresto 97-103mg  BID, furosemide 40mg  daily, spironolactone 12.5 mg daily  Previously tried: valsartan - d/c'ed when switched to Entresto  BP goal: <130/44mmHg  Family History: The patient's family history includes Alzheimer's disease in his mother; CVA in his father; Diabetes in his brother, mother, and sister; Epilepsy in his sister; Heart failure in his father  Social History: Denies tobacco use, occasional alcohol use, denies illicit drug use.  Diet: States he has a hard time sticking to a diet and not eating cookies if they are in the house. Does not add salt to his food, but cooks the majority of his food and therefore he states he knows his portion sizes are larger than they should be.  Mainly broils and bakes chicken, fish, and [place].  Does not eat a lot of red meat.   Exercise: Wants to start going to the gym again, pt states he will start back at [facility] as is it convenient for him.   Home BP readings: He has a wrist cuff and checks his BP occasionally, wrist cuff is a bit too small though and his readings fluctuate.  Last home readings were around 120/84 mmHg.   Wt Readings from Last 3 Encounters:  09/15/16 (!) 368 lb 12 oz (167.3 kg)  05/13/16 (!) 373 lb (169.2 kg)  05/12/16 (!) 376 lb (170.6 kg)   BP Readings from Last 3 Encounters:  09/15/16 128/84  05/13/16 126/80  05/12/16 118/82   Pulse Readings from Last 3 Encounters:  09/15/16 87  05/13/16 92  05/12/16 74   Renal function: CrCl cannot be calculated (Unknown ideal weight.).  Past Medical History:  Diagnosis Date  . Asthma   . Automatic implantable cardioverter-defibrillator in situ   . Back pain   . Chronic systolic CHF (congestive heart failure), NYHA  class 3 (HCC)   . Complete heart block [facility])    s/p PPM 1997 with upgrade to AICD 2011  . Depression   . Dilated aortic root (HCC)    40mm ascending aortic root  . DM (diabetes mellitus) (HCC)   . Glaucoma   . HTN (hypertension)   . Hyperlipidemia   . Nonischemic cardiomyopathy (HCC)    EF of 15-20% by echo 2015 and 20% by nuclear stress test 2017  . Obesity   . Sarcoid   . Sleep apnea 06-28-12   no cpap used-unable to tolerate  . SVT (supraventricular tachycardia) [facility])    s/p ablation    Current Outpatient Prescriptions on File Prior to Visit  Medication Sig Dispense Refill  . acetaminophen (TYLENOL) 650 MG CR tablet Take 650 mg by mouth every 8 (eight) hours as needed for pain.    Marland Kitchen albuterol (PROVENTIL HFA;VENTOLIN HFA) 108 (90 BASE) MCG/ACT inhaler Inhale 2 puffs into the lungs every 6 (six) hours as needed. For shortness of breath    . aspirin 81 MG tablet Take 81 mg by mouth daily.    . carvedilol (COREG) 25 MG tablet Take 1 tablet (25 mg total) by mouth 2 (two) times daily. 180 tablet 3  . Fluticasone Furoate (ARNUITY ELLIPTA) 100 MCG/ACT AEPB Inhale 1 puff into the lungs daily. 30 each 11  . furosemide (LASIX) 40 MG tablet TAKE 1 TABLET BY MOUTH EVERY DAY 90 tablet 1  . insulin glargine (LANTUS) 100 UNIT/ML injection Inject 50 Units into the skin 2 (two) times daily.     . IRON PO Take 1 tablet by mouth as directed. OTC-Patient unsure of dosage     . Liraglutide (VICTOZA) 18 MG/3ML SOPN Inject 1.2 mg into the skin daily.    . Maca 500 MG CAPS Take 500 mg by mouth daily.    . methocarbamol (ROBAXIN) 500 MG tablet Take 500 mg by mouth 3 (three) times daily as needed for pain.  2  . sacubitril-valsartan (ENTRESTO) 97-103 MG Take 1 tablet by mouth 2 (two) times daily. 60 tablet 11  . simvastatin (ZOCOR) 40 MG tablet Take 40 mg by mouth every evening.    Marland Kitchen spironolactone (ALDACTONE) 25 MG tablet Take 0.5 tablets (12.5 mg total) by mouth daily. 15 tablet 11  . tadalafil (CIALIS)  10 MG tablet Take 1 tablet (10 mg total) by mouth daily as needed for erectile dysfunction. 10 tablet 3  . vitamin B-12 (CYANOCOBALAMIN) 250 MCG tablet Take 250 mcg by mouth daily.    . vitamin C (ASCORBIC ACID) 500 MG tablet Take 500 mg by mouth daily.     No current facility-administered medications on file prior to visit.     Allergies  Allergen Reactions  . Penicillins Other (See Comments)    dizziness  Assessment/Plan:  Hypertension/HF medication optimization: BP today was controlled at 110/68 mmHg at goal of <130/80 mmHg. Pulse is also within normal limits and BMET is stable. HF regimen is now optimized and pt is tolerating his regimen of carvedilol  BID, Entresto 97-103mg  BID, furosemide  daily, spironolactone 12.5 mg daily. He was advised to purchase a pill box and [facility] to help with compliance and spironolactone dose accuracy. He has room for exercise optimization with a goal to join [employer] in the near future. Encouraged to continue to take blood pressures at home. Will continue this optimized regimen and follow-up in HTN clinic as needed.   Patient also received a quadrivalent influenza vaccine in clinic.    Meleena Munroe L. Marcy Salvo, PharmD, MS PGY1 Pharmacy Resident [facility]

## 2016-10-03 NOTE — Patient Instructions (Addendum)
It was nice to meet you today!  Your blood pressure was at goal today of <130/80 mmHg at 110/68 mmHg.   Continue your medications: carvedilol 25mg  BID, Entresto 97-103mg  BID, furosemide 40mg  daily, spironolactone 12.5 mg daily.  A pill box and pill cutter can be [facility]. I encourage a [employer] and continue to check your blood pressure at home.   Follow-up in HTN clinic as needed.

## 2016-10-21 ENCOUNTER — Ambulatory Visit (INDEPENDENT_AMBULATORY_CARE_PROVIDER_SITE_OTHER): Payer: Medicare Other | Admitting: *Deleted

## 2016-10-21 ENCOUNTER — Ambulatory Visit (INDEPENDENT_AMBULATORY_CARE_PROVIDER_SITE_OTHER): Payer: Medicare Other | Admitting: Cardiology

## 2016-10-21 ENCOUNTER — Encounter: Payer: Self-pay | Admitting: Cardiology

## 2016-10-21 VITALS — BP 130/76 | HR 95 | Ht 70.0 in | Wt 369.8 lb

## 2016-10-21 DIAGNOSIS — I10 Essential (primary) hypertension: Secondary | ICD-10-CM

## 2016-10-21 DIAGNOSIS — I442 Atrioventricular block, complete: Secondary | ICD-10-CM

## 2016-10-21 DIAGNOSIS — I77810 Thoracic aortic ectasia: Secondary | ICD-10-CM

## 2016-10-21 DIAGNOSIS — I5022 Chronic systolic (congestive) heart failure: Secondary | ICD-10-CM

## 2016-10-21 DIAGNOSIS — I471 Supraventricular tachycardia: Secondary | ICD-10-CM | POA: Diagnosis not present

## 2016-10-21 DIAGNOSIS — I428 Other cardiomyopathies: Secondary | ICD-10-CM | POA: Diagnosis not present

## 2016-10-21 LAB — CUP PACEART INCLINIC DEVICE CHECK
Battery Remaining Longevity: 21 mo
Brady Statistic RA Percent Paced: 4 %
HIGH POWER IMPEDANCE MEASURED VALUE: 40.0909
Implantable Lead Implant Date: 19961213
Implantable Lead Location: 753859
Implantable Lead Model: 5524
Implantable Lead Model: 7121
Lead Channel Impedance Value: 437.5 Ohm
Lead Channel Pacing Threshold Amplitude: 0.75 V
Lead Channel Pacing Threshold Amplitude: 0.75 V
Lead Channel Pacing Threshold Pulse Width: 0.5 ms
Lead Channel Pacing Threshold Pulse Width: 0.5 ms
Lead Channel Pacing Threshold Pulse Width: 0.5 ms
Lead Channel Sensing Intrinsic Amplitude: 2.1 mV
Lead Channel Setting Sensing Sensitivity: 2 mV
MDC IDC LEAD IMPLANT DT: 20111202
MDC IDC LEAD LOCATION: 753860
MDC IDC MSMT LEADCHNL RA PACING THRESHOLD AMPLITUDE: 0.75 V
MDC IDC MSMT LEADCHNL RV IMPEDANCE VALUE: 337.5 Ohm
MDC IDC MSMT LEADCHNL RV PACING THRESHOLD AMPLITUDE: 0.75 V
MDC IDC MSMT LEADCHNL RV PACING THRESHOLD PULSEWIDTH: 0.5 ms
MDC IDC MSMT LEADCHNL RV SENSING INTR AMPL: 12 mV
MDC IDC PG IMPLANT DT: 20111202
MDC IDC PG SERIAL: 629155
MDC IDC SESS DTM: 20181012115806
MDC IDC SET LEADCHNL RA PACING AMPLITUDE: 2 V
MDC IDC SET LEADCHNL RV PACING AMPLITUDE: 2 V
MDC IDC SET LEADCHNL RV PACING PULSEWIDTH: 0.5 ms
MDC IDC STAT BRADY RV PERCENT PACED: 99.29 %

## 2016-10-21 MED ORDER — RIVAROXABAN 20 MG PO TABS: 20.0000 mg | ORAL_TABLET | Freq: Every day | ORAL | 11 refills | Status: DC

## 2016-10-21 MED ORDER — METOPROLOL TARTRATE 25 MG PO TABS: 25.0000 mg | ORAL_TABLET | Freq: Two times a day (BID) | ORAL | 3 refills | Status: DC

## 2016-10-21 NOTE — Progress Notes (Signed)
Cardiology Office Note:    Date:  10/21/2016   ID:  [name], DOB September 05, 1955, MRN [id number]  PCP:  Jacob Housekeeper, MD  Cardiologist:  Jacob Magic, MD   Referring MD: Jacob Housekeeper, MD   Chief Complaint  Patient presents with  . Cardiomyopathy  . Congestive Heart Failure  . Hypertension    History of Present Illness:    [name] is a 61 y.o. male with a hx of nonischemic DCM (normal coronary arteries by cath 1996), Sarcoidosis, chronic systolic CHF, HTN, CHB s/p dual chamber AICD and SVT s/p ablation. He is here today for followup and is doing well.  He has chronic stable DOE which he thinks has not changed.  He says that he can walk at least 2 blocks without any problems.  He denies any chest pain or pressure, PND, orthopnea, LE edema, dizziness, palpitations or syncope. He is compliant with her meds and is tolerating meds with no SE.    Past Medical History:  Diagnosis Date  . Asthma   . Automatic implantable cardioverter-defibrillator in situ   . Back pain   . Chronic systolic CHF (congestive heart failure), NYHA class 3 (HCC)   . Complete heart block [facility])    s/p PPM 1997 with upgrade to AICD 2011  . Depression   . Dilated aortic root (HCC)    40mm ascending aortic root  . DM (diabetes mellitus) (HCC)   . Glaucoma   . HTN (hypertension)   . Hyperlipidemia   . Nonischemic cardiomyopathy (HCC)    EF of 15-20% by echo 2015 and 20% by nuclear stress test 2017  . Obesity   . Sarcoid   . Sleep apnea 06-28-12   no cpap used-unable to tolerate  . SVT (supraventricular tachycardia) (HCC)    s/p ablation    Past Surgical History:  Procedure Laterality Date  . COLONOSCOPY WITH PROPOFOL N/A 07/17/2012   Procedure: COLONOSCOPY WITH PROPOFOL;  Surgeon: Charolett Bumpers, MD;  Location: [facility] END[facility]COPY;  Service: Endoscopy;  Laterality: N/A;  . HEMORRHOID SURGERY     early 20's  . PACEMAKER INSERTION    . TONSILLECTOMY      Current Medications: Current  Meds  Medication Sig  . acetaminophen (TYLENOL) 650 MG CR tablet Take 650 mg by mouth every 8 (eight) hours as needed for pain.  Marland Kitchen albuterol (PROVENTIL HFA;VENTOLIN HFA) 108 (90 BASE) MCG/ACT inhaler Inhale 2 puffs into the lungs every 6 (six) hours as needed. For shortness of breath  . aspirin 81 MG tablet Take 81 mg by mouth daily.  . carvedilol (COREG) 25 MG tablet Take 1 tablet (25 mg total) by mouth 2 (two) times daily.  . Copper Gluconate (COPPER CAPS PO) Take by mouth.  . Fluticasone Furoate (ARNUITY ELLIPTA) 100 MCG/ACT AEPB Inhale 1 puff into the lungs daily.  . furosemide (LASIX) 40 MG tablet TAKE 1 TABLET BY MOUTH EVERY DAY  . insulin glargine (LANTUS) 100 UNIT/ML injection Inject 50 Units into the skin 2 (two) times daily.   . IRON PO Take 1 tablet by mouth as directed. OTC-Patient unsure of dosage   . Liraglutide (VICTOZA) 18 MG/3ML SOPN Inject 1.2 mg into the skin daily.  . Maca 500 MG CAPS Take 500 mg by mouth daily.  . methocarbamol (ROBAXIN) 500 MG tablet Take 500 mg by mouth 3 (three) times daily as needed for pain.  . sacubitril-valsartan (ENTRESTO) 97-103 MG Take 1 tablet by mouth 2 (two) times daily.  Marland Kitchen  SHINGRIX injection   . simvastatin (ZOCOR) 40 MG tablet Take 40 mg by mouth every evening.  Marland Kitchen spironolactone (ALDACTONE) 25 MG tablet Take 0.5 tablets (12.5 mg total) by mouth daily.  . tadalafil (CIALIS) 10 MG tablet Take 1 tablet (10 mg total) by mouth daily as needed for erectile dysfunction.  Marland Kitchen zinc gluconate 50 MG tablet Take 50 mg by mouth daily.     Allergies:   Penicillins   Social History   Social History  . Marital status: Married    Spouse name: N/A  . Number of children: N/A  . Years of education: N/A   Social History Main Topics  . Smoking status: Never Smoker  . Smokeless tobacco: Never Used  . Alcohol use Yes     Comment: occasionally  . Drug use: No  . Sexual activity: Yes   Other Topics Concern  . None   Social History Narrative  . None      Family History: The patient's family history includes Alzheimer's disease in his mother; CVA in his father; Diabetes in his brother, mother, and sister; Epilepsy in his sister; Heart failure in his father.  ROS:   Please see the history of present illness.    ROS  All other systems reviewed and negative.   EKGs/Labs/Other Studies Reviewed:    The following studies were reviewed today: none  EKG:  EKG is not ordered today.    Recent Labs: 09/23/2016: BUN 8; Creatinine, Ser 1.02; Potassium 4.2; Sodium 142   Recent Lipid Panel    Component Value Date/Time   CHOL  09/22/2009 0330    134        ATP III CLASSIFICATION:  <200     mg/dL   Desirable  026-378  mg/dL   Borderline High  >=588    mg/dL   High          TRIG 502 09/22/2009 0330   HDL 47 09/22/2009 0330   CHOLHDL 2.9 09/22/2009 0330   VLDL 25 09/22/2009 0330   LDLCALC  09/22/2009 0330    62        Total Cholesterol/HDL:CHD Risk Coronary Heart Disease Risk Table                     Men   Women  1/2 Average Risk   3.4   3.3  Average Risk       5.0   4.4  2 X Average Risk   9.6   7.1  3 X Average Risk  23.4   11.0        Use the calculated Patient Ratio above and the CHD Risk Table to determine the patient's CHD Risk.        ATP III CLASSIFICATION (LDL):  <100     mg/dL   Optimal  774-128  mg/dL   Near or Above                    Optimal  130-159  mg/dL   Borderline  786-767  mg/dL   High  >209     mg/dL   Very High    Physical Exam:    VS:  BP 130/76   Pulse 95   Ht 5\' 10"  (1.778 m)   Wt (!) 369 lb 12.8 oz (167.7 kg)   SpO2 95%   BMI 53.06 kg/m     Wt Readings from Last 3 Encounters:  10/21/16 (!) 369 lb 12.8 oz (167.7 kg)  09/15/16 (!) 368 lb 12 oz (167.3 kg)  05/13/16 (!) 373 lb (169.2 kg)     GEN:  Well nourished, well developed in no acute distress HEENT: Normal NECK: No JVD; No carotid bruits LYMPHATICS: No lymphadenopathy CARDIAC: RRR, no murmurs, rubs, gallops RESPIRATORY:  Clear  to auscultation without rales, wheezing or rhonchi  ABDOMEN: Soft, non-tender, non-distended MUSCULOSKELETAL:  No edema; No deformity  SKIN: Warm and dry NEUROLOGIC:  Alert and oriented x 3 PSYCHIATRIC:  Normal affect   ASSESSMENT:    1. Chronic systolic heart failure (HCC)   2. Essential hypertension   3. Nonischemic cardiomyopathy (HCC)   4. Complete heart block (HCC)   5. SVT (supraventricular tachycardia) (HCC)   6. Dilated aortic root (HCC)    PLAN:    In order of problems listed above:  1.  Chronic systolic CHF - he appears euvolemic on exam today.  His weight is stable.  He will continue on Entresto 97-103mg  BID, aldactone  daily, Carvedilol  BID and Lasix  daily.  Renal function is stable with last creatinine last month at 1.02.  His HR is still elevated at 95 bpm despite carvedilol  BID.  I will incre  2.  HTN - BP is well controlled on exam.  He will continue on carvedilol and Entresto.    3.  NIDCM - EF was 15% on echo 02/2016.  S/P AICD.  4.  Complete heart block s/p PPM  And then upgrade to dual chamber AICD - he is followed by device clinic.  5.  SVT - he has not had any further episodes of palpitations but on his AICD check he was noted to have 13 minutes of atrial fibrillation with RVR.  He is on maximum dose of Carvedilol.  I will add Lopressor  BID for afib suppression as well as better control of HR since his resting HR average is 80-95bpm.  His CHADS2VASC score is 3 so he needs to be anticoagulated long term.  I will start him on Xarelto  daily and stop ASA.    6.  Dilated aortic root - 40mm by echo 02/2016 - repeat echo 02/2017.   Medication Adjustments/Labs and Tests Ordered: Current medicines are reviewed at length with the patient today.  Concerns regarding medicines are outlined above.  No orders of the defined types were placed in this encounter.  No orders of the defined types were placed in this encounter.   Signed, Jacob Magic, MD  10/21/2016 10:37 AM    [facility]

## 2016-10-21 NOTE — Patient Instructions (Signed)
Medication Instructions:  Please stop your Asprin. Start Lopressor (metoprolol tartrate) 25 mg twice a day. Start Xarelto 20 mg a day with your biggest meal of the day. Continue all other medications as listed.  Follow-Up: Please follow up in 2 weeks in the Hypertension Clinic.  Follow up in 3 months with Dr [name].  If you need a refill on your cardiac medications before your next appointment, please call your pharmacy.  Thank you for choosing [facility]!!

## 2016-10-21 NOTE — Progress Notes (Signed)
ICD check in clinic at request of Dr. [name] Normal device function. Thresholds and sensing consistent with previous device measurements. Impedance trends stable over time. No evidence of any ventricular arrhythmias. <1% AT/AF~ peak A 640bpm~38min. Histogram distribution appropriate for patient and level of activity. No changes made this session. Device programmed at appropriate safety margins. Device programmed to optimize intrinsic conduction. Estimated longevity 1.8 years. Pt enrolled in remote follow-up. Remote 11/10/2016

## 2016-11-09 ENCOUNTER — Encounter: Payer: Self-pay | Admitting: Internal Medicine

## 2016-11-09 ENCOUNTER — Ambulatory Visit (INDEPENDENT_AMBULATORY_CARE_PROVIDER_SITE_OTHER): Payer: Medicare Other | Admitting: Internal Medicine

## 2016-11-09 VITALS — BP 126/78 | HR 105 | Ht 70.0 in | Wt 369.0 lb

## 2016-11-09 DIAGNOSIS — J4530 Mild persistent asthma, uncomplicated: Secondary | ICD-10-CM | POA: Diagnosis not present

## 2016-11-09 DIAGNOSIS — Z862 Personal history of diseases of the blood and blood-forming organs and certain disorders involving the immune mechanism: Secondary | ICD-10-CM

## 2016-11-09 DIAGNOSIS — R05 Cough: Secondary | ICD-10-CM | POA: Diagnosis not present

## 2016-11-09 DIAGNOSIS — R059 Cough, unspecified: Secondary | ICD-10-CM

## 2016-11-09 LAB — POCT EXHALED NITRIC OXIDE: FENO LEVEL (PPB): 15

## 2016-11-09 NOTE — Patient Instructions (Signed)
ICD-10-CM   1. Cough R05 POCT EXHALED NITRIC OXIDE  2. History of sarcoidosis Z86.2   3. Mild persistent asthma, uncomplicated J45.30     Your nitric oxide test is normal suggesting controlled asthma on arnuity Cough could be related to increased coreg dose; I wil inform Dr [name] CT scan showed burnt out sarcoid which could be contributing to cough but I doubt because cough got worse after medication change  Plan - contnue arnuity daily = will inform Dr [name]   Followup 6 months or sooner if needed

## 2016-11-09 NOTE — Progress Notes (Signed)
Subjective:     Patient ID: [name], male   DOB: [birth year], 61 y.o.   MRN: [id number]  HPI   IOV 05/22/2015  Chief Complaint  Patient presents with  . Pulmonary Consult    Referred by [name]; Asthma/ Sarcoidosis; SOB; chest tightness.  non-productive cough.    61 year-old morbidly obese male with nonischemic cardiac myopathy and chronic back problems and sleep apnea followed by Dr. [name] referred for evaluation of possible sarcoidosis and asthma.  He migrated here in 39 from [place]. At that time he was in his 30s. He says he had like myalgias and was diagnosed with pulmonary sarcoidosis by Dr. [name] at [facility]. He remembers having a bronchoscopy and transbronchial biopsy. Further details unknown. Other than the fact he took prednisone for few to several months. He says over the next few years as sarcoidosis resolved according to follow at [facility]. And then some 15 years ago was diagnosed with nonischemic cardiomyopathy and his been disabled. Then some 20 years ago was diagnosed to have asthma by his primary care physician. He had asthma when away but in the last year or so it has come back. For the last 6 months he been using albuterol rescue at least several times daily. It is associated with wheeze. Symptoms of asthma include dyspnea on exertion and weakness at rest. Wheezing is made worse by cold temperature and relieved by albuterol. Moderate in intensity. There is no fever or sputum production.   Asthma control questionnaire.: He never wakes up in the middle of the night because of asthma symptoms. When he wakes up in the morning he perceives very mild symptoms. He feels very mildly limited because of asthma with his activities. He experiences a moderate amount of shortness of breath with exertion relieved by rest but also made worse by cold temperature and relieved by albuterol which he perceives is because of asthma. He wheezes a moderate amount of the  time and uses albuterol for rescue at least 3-4 puffs daily. 5. question as 1.6 and shows active symptoms.   FEno  - 29ppb and borderline - this is with patient taking albuterol as needed. He is not on inhaled steroids. He is noted to be a nonspecific beta blocker carvedilol. Visualization of 5 chest x-ray from 2007 all the way to 2016 show clear lung fields without any mediastinal adenopathy. He does not have any CT chest blood work review shows hemoglobin 12 g percent and normal creatinine.     06/23/2015 follow up Returns for a one-month follow-up. Patient was seen last visit for pulmonary consult for possible sarcoidosis and asthma. Patient was being seen in [facility] by pulmonologist. Diagnosed with sarcoid years ago. He has been on and off of prednisone several times in the past.  Last ov  , FENO was 29. Patient was set up for a CT chest that showed mild irregular thickening along the right major fissure, scattered pulmonary nodules in the right lung. Parenchymal bands in the lingula and left lower lobe. These could represent nonspecific findings associated with sarcoid. No other evidence of interstitial lung disease was noted. Mild subcarinal and bilateral hilar adenopathy. Pulmonary function test performed today showed an FEV1 at 62%, ratio 83, FVC 58%, no significant bronchodilator response, total lung capacity 63%, DLCO 70% Patient was started on Pulmicort last visit. Patient feels that this has improved his shortness of breath and cough.     OV 09/23/2015  Chief Complaint  Patient  presents with  . Follow-up    Pt states his SOBhas improved since last OV in 06/2015. Pt c/o prod cough with clear mucus. Pt denies CP/tightness and f/c/s.    82o-year-old morbidly obese male with a history of sarcoidosis in the past. Originally seen in May 2017 for wheezing. Evaluation showed that he had burned out sarcoidosis. Exhaled nitric oxide was slightly elevated at 29 ppb. Restart him on inhaled  steroids. He is taking Flovent. This significantly improved his symptoms. Currently he tells me that he continues to maintain a wheeze free life. His only symptom is mild occasional cough when he lies down. At this point in time he does not want therapy for this. He thinks he can prop up his head that might help the cough. He will have flu shot today.      OV 05/13/2016  Chief Complaint  Patient presents with  . Follow-up    Pt states his breathing is doing well. Pt states he has an intermittent dry cough at night. Pt denies CP/tightness and f/c/s.    61 year old morbidly obese male with a history of burnt sarcoidosis in the past. Now with a diagnosis of mild persistent asthma based on borderline exhaled nitric oxide and therapeutic response to Flovent in terms of his dyspnea wheeze and cough. At last visit he still had some residual cough for which she did not want change in therapy. He is happy with his Flovent. He is somewhat reluctant to change it. Nevertheless he is only taking his Flovent once daily. He does have cough at night as a dry cough. However it is mild. He doesn't seem bothered by it. Of note he does have sleep apnea which is managed by his primary care physician but he says that he is noncompliant using his CPAP.   OV 11/09/2016  . Chief Complaint  Patient presents with  . Follow-up    still has dry cough/not wearing CPAP   61 year old male withmorbid obesity and burned out sarcoidosis in the past.Now being treated for mild persistent asthma based on 4 nitric oxide and therapeutic response to inhaled steroid  Last visit May 2018. At that time he was doing well with her Flovent we switched him to inhaled steroid  arnuity and he tells me he was doing well with it. Then approximately one or 2 months ago cardiology Dr. [name] changed his medications according to his history. Review of the chart shows that indeed there was several changes including a recent increase of this month  October 2018 on his carvedilol. He had a CT scan June 2018 that shows evidence of burn doubt sarcoidosis as documented below. His nodules resolved.. The main issues that since the cardiology medication changes having worsening cough is dry   Results for IVOR, KISHI (MRN [id number]) as of 11/09/2016 12:07  Ref. Range 05/22/2015 16:27 11/09/2016   Nitric Oxide Unknown 29 15 per RN   IMPRESSION: 1. Previously noted small pulmonary nodules are either stable or have resolved compared to the prior study, and are considered benign. 2. Scattered areas of bronchiectasis predominantly in the lung bases. 3. Very mild subpleural nodularity, most evident along the right major fissure. This could conceivably be a manifestation of reported sarcoidosis, but these changes are very mild. 4. Aortic atherosclerosis, in addition to 3 vessel coronary artery disease. Please note that although the presence of coronary artery calcium documents the presence of coronary artery disease, the severity of this disease and any potential stenosis cannot be assessed  on this non-gated CT examination. Assessment for potential risk factor modification, dietary therapy or pharmacologic therapy may be warranted, if clinically indicated. 5. Mild cardiomegaly. 6. Additional incidental findings, as above.  Aortic Atherosclerosis (ICD10-I70.0).   Electronically Signed   By: Trudie Reed M.D.   On: 06/23/2016 14:13   has a past medical history of Asthma; Automatic implantable cardioverter-defibrillator in situ; Back pain; Chronic systolic CHF (congestive heart failure), NYHA class 3 (HCC); Complete heart block (HCC); Depression; Dilated aortic root (HCC); DM (diabetes mellitus) (HCC); Glaucoma; HTN (hypertension); Hyperlipidemia; Nonischemic cardiomyopathy (HCC); Obesity; Sarcoid; Sleep apnea (06-28-12); and SVT (supraventricular tachycardia) (HCC).   reports that he has never smoked. He has never used  smokeless tobacco.  Past Surgical History:  Procedure Laterality Date  . COLONOSCOPY WITH PROPOFOL N/A 07/17/2012   Procedure: COLONOSCOPY WITH PROPOFOL;  Surgeon: Charolett Bumpers, MD;  Location: [facility];  Service: Endoscopy;  Laterality: N/A;  . HEMORRHOID SURGERY     early 20's  . PACEMAKER INSERTION    . TONSILLECTOMY      Allergies  Allergen Reactions  . Penicillins Other (See Comments)    dizziness    Immunization History  Administered Date(s) Administered  . Influenza, High Dose Seasonal PF 09/23/2015  . Influenza,inj,Quad PF,6+ Mos 10/03/2016  . Influenza-Unspecified 11/11/2014  . Pneumococcal-Unspecified 01/11/2015    Family History  Problem Relation Age of Onset  . Diabetes Mother   . Alzheimer's disease Mother   . CVA Father   . Heart failure Father   . Epilepsy Sister   . Diabetes Brother   . Diabetes Sister      Current Outpatient Prescriptions:  .  acetaminophen (TYLENOL) 650 MG CR tablet, Take 650 mg by mouth every 8 (eight) hours as needed for pain., Disp: , Rfl:  .  albuterol (PROVENTIL HFA;VENTOLIN HFA) 108 (90 BASE) MCG/ACT inhaler, Inhale 2 puffs into the lungs every 6 (six) hours as needed. For shortness of breath, Disp: , Rfl:  .  carvedilol (COREG) 25 MG tablet, Take 1 tablet (25 mg total) by mouth 2 (two) times daily., Disp: 180 tablet, Rfl: 3 .  Copper Gluconate (COPPER CAPS PO), Take by mouth., Disp: , Rfl:  .  Fluticasone Furoate (ARNUITY ELLIPTA) 100 MCG/ACT AEPB, Inhale 1 puff into the lungs daily., Disp: 30 each, Rfl: 11 .  furosemide (LASIX) 40 MG tablet, TAKE 1 TABLET BY MOUTH EVERY DAY, Disp: 90 tablet, Rfl: 1 .  insulin glargine (LANTUS) 100 UNIT/ML injection, Inject 50 Units into the skin 2 (two) times daily. , Disp: , Rfl:  .  IRON PO, Take 1 tablet by mouth as directed. OTC-Patient unsure of dosage , Disp: , Rfl:  .  Liraglutide (VICTOZA) 18 MG/3ML SOPN, Inject 1.2 mg into the skin daily., Disp: , Rfl:  .  Maca 500 MG CAPS, Take  500 mg by mouth daily., Disp: , Rfl:  .  methocarbamol (ROBAXIN) 500 MG tablet, Take 500 mg by mouth 3 (three) times daily as needed for pain., Disp: , Rfl: 2 .  metoprolol tartrate (LOPRESSOR) 25 MG tablet, Take 1 tablet (25 mg total) by mouth 2 (two) times daily., Disp: 180 tablet, Rfl: 3 .  rivaroxaban (XARELTO) 20 MG TABS tablet, Take 1 tablet (20 mg total) by mouth daily with supper., Disp: 30 tablet, Rfl: 11 .  sacubitril-valsartan (ENTRESTO) 97-103 MG, Take 1 tablet by mouth 2 (two) times daily., Disp: 60 tablet, Rfl: 11 .  SHINGRIX injection, , Disp: , Rfl:  .  simvastatin (ZOCOR) 40 MG tablet, Take 40 mg by mouth every evening., Disp: , Rfl:  .  spironolactone (ALDACTONE) 25 MG tablet, Take 0.5 tablets (12.5 mg total) by mouth daily., Disp: 15 tablet, Rfl: 11 .  tadalafil (CIALIS) 10 MG tablet, Take 1 tablet (10 mg total) by mouth daily as needed for erectile dysfunction., Disp: 10 tablet, Rfl: 3 .  zinc gluconate 50 MG tablet, Take 50 mg by mouth daily., Disp: , Rfl:     Review of Systems     Objective:   Physical Exam  Constitutional: He is oriented to person, place, and time. He appears well-developed and well-nourished. No distress.  HENT:  Head: Normocephalic and atraumatic.  Right Ear: External ear normal.  Left Ear: External ear normal.  Mouth/Throat: Oropharynx is clear and moist. No oropharyngeal exudate.  Eyes: Pupils are equal, round, and reactive to light. Conjunctivae and EOM are normal. Right eye exhibits no discharge. Left eye exhibits no discharge. No scleral icterus.  Neck: Normal range of motion. Neck supple. No JVD present. No tracheal deviation present. No thyromegaly present.  Cardiovascular: Normal rate, regular rhythm and intact distal pulses.  Exam reveals no gallop and no friction rub.   No murmur heard. Pulmonary/Chest: Effort normal and breath sounds normal. No respiratory distress. He has no wheezes. He has no rales. He exhibits no tenderness.   Abdominal: Soft. Bowel sounds are normal. He exhibits no distension and no mass. There is no tenderness. There is no rebound and no guarding.  Musculoskeletal: Normal range of motion. He exhibits no edema or tenderness.  Lymphadenopathy:    He has no cervical adenopathy.  Neurological: He is alert and oriented to person, place, and time. He has normal reflexes. No cranial nerve deficit. Coordination normal.  Skin: Skin is warm and dry. No rash noted. He is not diaphoretic. No erythema. No pallor.  Psychiatric: He has a normal mood and affect. His behavior is normal. Judgment and thought content normal.  Nursing note and vitals reviewed.  Vitals:   11/09/16 1141  BP: 126/78  Pulse: (!) 105  SpO2: 96%  Weight: (!) 369 lb (167.4 kg)  Height: 5\' 10"  (1.778 m)    Estimated body mass index is 52.95 kg/m as calculated from the following:   Height as of this encounter: 5\' 10"  (1.778 m).   Weight as of this encounter: 369 lb (167.4 kg).       Assessment:       ICD-10-CM   1. Cough R05 POCT EXHALED NITRIC OXIDE  2. History of sarcoidosis Z86.2   3. Mild persistent asthma, uncomplicated J45.30        Plan:       Your nitric oxide test is normal suggesting controlled asthma on arnuity Cough could be related to increased coreg dose; I wil inform Dr Mayford Knifeurner CT scan showed burnt out sarcoid which could be contributing to cough but I doubt because cough got worse after medication change  Plan - contnue arnuity daily = will inform Dr Mayford Knifeurner   Followup 6 months or sooner if needed   Dr. Kalman ShanMurali Aldric Wenzler, M.D., [facility] HospitalF.C.C.P Pulmonary and Critical Care Medicine Staff Physician [facility] Pager: [phone], If no answer or between  15:00h - 7:00h: call [phone]  11/09/2016 12:15 PM

## 2016-11-10 ENCOUNTER — Ambulatory Visit (INDEPENDENT_AMBULATORY_CARE_PROVIDER_SITE_OTHER): Payer: Medicare Other | Admitting: *Deleted

## 2016-11-10 ENCOUNTER — Ambulatory Visit (INDEPENDENT_AMBULATORY_CARE_PROVIDER_SITE_OTHER): Payer: Medicare Other | Admitting: Pharmacist

## 2016-11-10 VITALS — BP 134/98 | HR 82

## 2016-11-10 DIAGNOSIS — I442 Atrioventricular block, complete: Secondary | ICD-10-CM | POA: Diagnosis not present

## 2016-11-10 DIAGNOSIS — I10 Essential (primary) hypertension: Secondary | ICD-10-CM

## 2016-11-10 DIAGNOSIS — I428 Other cardiomyopathies: Secondary | ICD-10-CM

## 2016-11-10 NOTE — Progress Notes (Signed)
Patient ID: [name]                 DOB: 1955-12-13                      MRN: [id number]     HPI: [name] is a 61 y.o. male referred by Dr. Hyman Bower pharmacyclinicfor HTN/HF medication optimization.PMH is significant for NICM with EF 15% (normal coronary arteries by cath 1996), sarcoidosis, asthma, HTN, CHB s/p dual chamber AICD, AFib, and SVT s/p ablation. F/u on 03/24/16 on increased Entresto dose to 49-51mg  BIDand pt reported feeling better with more energy. His SOB, PND, and orthopnea had resolved. However, pt misunderstood directions and stopped his Coreg when he increased his Entresto dose. His Entresto was then increasedto 97-103mg  BID. On 08/30/16,[facility] called stating that pt was taking valsartan and Entresto together for multiple months. Pt was advised to stop his valsartan and only take Entresto. At his HTN visit on 09/15/16, blood pressure was well-controlled at 128/84 mmHg and spironolactone 12.5 mg daily was initiated for HF optimization. A BMET was obtained a week later and was stable. At his last HTN clinic visit on 10/03/16 he stated spironolactone had increased his voiding but otherwise had no issues and BP was well-controlled so no changes were made. Pt followed up with Dr. [name] 10/21/16 and was found to have elevated HR. In addition, he was found to have AFib with RVR on ICD device interrogation so metoprolol tartrate was added to carvedilol. Pt presented to pulmonologist 11/09/16 with worsening cough thought to be 2/2 increased beta-blockade vs sarcoid.  Pt presents today in good spirits. He states that he has been taking Lasix and spironolactone on opposite days due to increased voiding but he reports his weight has been stable. Pt states his cough has remained persistent and it is dry, but he feels like his asthma remains well-controlled with use of his inhalers. He endorses feeling somewhat fatigued after starting metoprolol, but has had no recurrent symptoms of  AFib. Pt still has not gotten a pill splitter and continues to bite spironolactone in half, he also has not gotten a pill box to organize medicines.  Current HTN meds:carvedilol 25mg  BID, Entresto 97-103mg  BID, furosemide 40mg  daily, spironolactone 12.5 mg daily, metoprolol tartrate 25mg  BID  Previously tried:valsartan - d/c'ed when switched to Entresto  BP goal: <130/79mmHg  Family History: The patient's family history includes Alzheimer's disease in his mother; CVA in his father; Diabetes in his brother, mother, and sister; Epilepsy in his sister; Heart failure in his father  Social History: Denies tobacco use, occasional alcohol use, denies illicit drug use.  Diet:States he has a hard time sticking to a diet and not eating cookies if they are in the house.Does not add salt to his food, but cooks the majority of his food and therefore he states he knows his portion sizes are larger than they should be. Mainly broils and bakes chicken, fish, and [place], does not eat a lot of red meat.   Exercise:Wants to start going to the gym again, pt states he will start back at [facility] as is it convenient for him - pt has been to the gym twice since last visit as medicines have made him tired.  Home BP readings: 130s/70s  Wt Readings from Last 3 Encounters:  11/09/16 (!) 369 lb (167.4 kg)  10/21/16 (!) 369 lb 12.8 oz (167.7 kg)  09/15/16 (!) 368 lb 12 oz (167.3 kg)  BP Readings from Last 3 Encounters:  11/09/16 126/78  10/21/16 130/76  10/03/16 110/68   Pulse Readings from Last 3 Encounters:  11/09/16 (!) 105  10/21/16 95  10/03/16 68    Renal function: CrCl cannot be calculated (Patient's most recent lab result is older than the maximum 21 days allowed.).  Past Medical History:  Diagnosis Date  . Asthma   . Automatic implantable cardioverter-defibrillator in situ   . Back pain   . Chronic systolic CHF (congestive heart failure), NYHA class 3 (HCC)   . Complete heart  block [facility](HCC)    s/p PPM 1997 with upgrade to AICD 2011  . Depression   . Dilated aortic root (HCC)    40mm ascending aortic root  . DM (diabetes mellitus) (HCC)   . Glaucoma   . HTN (hypertension)   . Hyperlipidemia   . Nonischemic cardiomyopathy (HCC)    EF of 15-20% by echo 2015 and 20% by nuclear stress test 2017  . Obesity   . Sarcoid   . Sleep apnea 06-28-12   no cpap used-unable to tolerate  . SVT (supraventricular tachycardia) [facility](HCC)    s/p ablation    Current Outpatient Prescriptions on File Prior to Visit  Medication Sig Dispense Refill  . acetaminophen (TYLENOL) 650 MG CR tablet Take 650 mg by mouth every 8 (eight) hours as needed for pain.    Marland Kitchen. albuterol (PROVENTIL HFA;VENTOLIN HFA) 108 (90 BASE) MCG/ACT inhaler Inhale 2 puffs into the lungs every 6 (six) hours as needed. For shortness of breath    . carvedilol (COREG) 25 MG tablet Take 1 tablet (25 mg total) by mouth 2 (two) times daily. 180 tablet 3  . Copper Gluconate (COPPER CAPS PO) Take by mouth.    . Fluticasone Furoate (ARNUITY ELLIPTA) 100 MCG/ACT AEPB Inhale 1 puff into the lungs daily. 30 each 11  . furosemide (LASIX) 40 MG tablet TAKE 1 TABLET BY MOUTH EVERY DAY 90 tablet 1  . insulin glargine (LANTUS) 100 UNIT/ML injection Inject 50 Units into the skin 2 (two) times daily.     . IRON PO Take 1 tablet by mouth as directed. OTC-Patient unsure of dosage     . Liraglutide (VICTOZA) 18 MG/3ML SOPN Inject 1.2 mg into the skin daily.    . Maca 500 MG CAPS Take 500 mg by mouth daily.    . methocarbamol (ROBAXIN) 500 MG tablet Take 500 mg by mouth 3 (three) times daily as needed for pain.  2  . metoprolol tartrate (LOPRESSOR) 25 MG tablet Take 1 tablet (25 mg total) by mouth 2 (two) times daily. 180 tablet 3  . rivaroxaban (XARELTO) 20 MG TABS tablet Take 1 tablet (20 mg total) by mouth daily with supper. 30 tablet 11  . sacubitril-valsartan (ENTRESTO) 97-103 MG Take 1 tablet by mouth 2 (two) times daily. 60 tablet 11    . SHINGRIX injection     . simvastatin (ZOCOR) 40 MG tablet Take 40 mg by mouth every evening.    Marland Kitchen. spironolactone (ALDACTONE) 25 MG tablet Take 0.5 tablets (12.5 mg total) by mouth daily. 15 tablet 11  . tadalafil (CIALIS) 10 MG tablet Take 1 tablet (10 mg total) by mouth daily as needed for erectile dysfunction. 10 tablet 3  . zinc gluconate 50 MG tablet Take 50 mg by mouth daily.     No current facility-administered medications on file prior to visit.     Allergies  Allergen Reactions  . Penicillins Other (See Comments)  dizziness     Assessment/Plan:  1. HF Optimization/Hypertension - HTN currently uncontrolled but likely due to poor adherence with spironolactone as noted above. Discussed importance of compliance extensively with pt and once again recommended getting a pill box and pill splitter. Did not make any changes today as BP has previously been well-controlled at goal <130/101mmHg while he was adherent to spironolactone. Will continue carvedilol 25mg  BID, Entresto 97/103mg  BID, metoprolol tartrate 25mg  BID, furosemide 40mg  daily, and spironolactone 12.5mg  daily. Could consider addition of hydralazine/Imdur in the future should BP remain uncontrolled for additional HF benefit. Pt will follow-up with HTN clinic as needed at this point as HF meds are optimized and BP had previously been well-controlled on current regimen.  Fredonia Highland, PharmD PGY-2 Cardiology Pharmacy Resident Pager: [phone] 11/10/2016

## 2016-11-10 NOTE — Patient Instructions (Signed)
It was great to see you today.  Keep taking your Entresto 97/103 mg twice daily, carvedilol 25mg  twice daily, spironolactone 12.5mg  every day, metoprolol tartrate 25mg  twice daily, furosemide 40mg  daily.  Follow-up with [place] only as needed.

## 2016-11-11 ENCOUNTER — Encounter: Payer: Self-pay | Admitting: Cardiology

## 2016-11-11 NOTE — Progress Notes (Signed)
Remote ICD transmission.   

## 2016-12-07 LAB — CUP PACEART REMOTE DEVICE CHECK
Battery Remaining Percentage: 24 %
Battery Voltage: 2.8 V
Brady Statistic RA Percent Paced: 3.6 %
Brady Statistic RV Percent Paced: 99 %
Date Time Interrogation Session: 20181101091548
HIGH POWER IMPEDANCE MEASURED VALUE: 43 Ohm
Implantable Lead Implant Date: 20111202
Implantable Lead Location: 753859
Implantable Lead Location: 753860
Implantable Lead Model: 5524
Implantable Lead Model: 7121
Implantable Pulse Generator Implant Date: 20111202
Lead Channel Impedance Value: 340 Ohm
Lead Channel Pacing Threshold Pulse Width: 0.5 ms
Lead Channel Sensing Intrinsic Amplitude: 12 mV
Lead Channel Setting Pacing Amplitude: 2 V
Lead Channel Setting Pacing Pulse Width: 0.5 ms
MDC IDC LEAD IMPLANT DT: 19961213
MDC IDC MSMT BATTERY REMAINING LONGEVITY: 22 mo
MDC IDC MSMT LEADCHNL RA IMPEDANCE VALUE: 450 Ohm
MDC IDC MSMT LEADCHNL RA PACING THRESHOLD AMPLITUDE: 0.75 V
MDC IDC MSMT LEADCHNL RA SENSING INTR AMPL: 1.5 mV
MDC IDC MSMT LEADCHNL RV PACING THRESHOLD AMPLITUDE: 0.625 V
MDC IDC MSMT LEADCHNL RV PACING THRESHOLD PULSEWIDTH: 0.5 ms
MDC IDC SET LEADCHNL RV PACING AMPLITUDE: 2 V
MDC IDC SET LEADCHNL RV SENSING SENSITIVITY: 2 mV
MDC IDC STAT BRADY AP VP PERCENT: 4 %
MDC IDC STAT BRADY AP VS PERCENT: 1 %
MDC IDC STAT BRADY AS VP PERCENT: 96 %
MDC IDC STAT BRADY AS VS PERCENT: 1 %
Pulse Gen Serial Number: 629155

## 2016-12-17 IMAGING — NM NM MISC PROCEDURE
3 series · 18 of 18 positions shown · non-contrast
Comparison: none

[Series 1: wbr_r-proj_st rest_(id)_sa · 6.5mm · 6.51mm/px · 6 of 64 frames shown]
[frame 6/64]
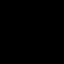
[frame 16/64]
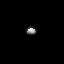
[frame 27/64]
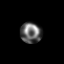
[frame 38/64]
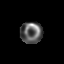
[frame 48/64]
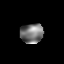
[frame 59/64]
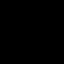

[Series 1: wbr_s-proj_st stress_(id)_sa · 6.5mm · 6.51mm/px · 6 of 512 frames shown (1 of 2)]
[frame 43/512]
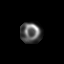
[frame 128/512]
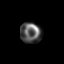
[frame 214/512]
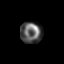
[frame 299/512]
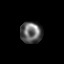
[frame 384/512]
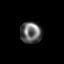
[frame 470/512]
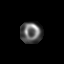

[Series 1: wbr_s-proj_st stress_(id)_sa · 6.5mm · 6.51mm/px · 6 of 64 frames shown (2 of 2)]
[frame 6/64]
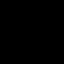
[frame 16/64]
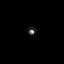
[frame 27/64]
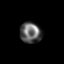
[frame 38/64]
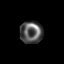
[frame 48/64]
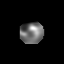
[frame 59/64]
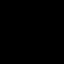

[18 of 18 positions shown; findings below may reference images not displayed]

Canned report from images found in remote index.

Refer to host system for actual result text.

## 2017-01-04 DIAGNOSIS — M549 Dorsalgia, unspecified: Secondary | ICD-10-CM | POA: Insufficient documentation

## 2017-01-04 DIAGNOSIS — H409 Unspecified glaucoma: Secondary | ICD-10-CM | POA: Insufficient documentation

## 2017-01-04 DIAGNOSIS — E669 Obesity, unspecified: Secondary | ICD-10-CM | POA: Insufficient documentation

## 2017-01-04 DIAGNOSIS — Z95810 Presence of automatic (implantable) cardiac defibrillator: Secondary | ICD-10-CM | POA: Insufficient documentation

## 2017-01-04 DIAGNOSIS — F329 Major depressive disorder, single episode, unspecified: Secondary | ICD-10-CM | POA: Insufficient documentation

## 2017-01-04 DIAGNOSIS — E119 Type 2 diabetes mellitus without complications: Secondary | ICD-10-CM | POA: Insufficient documentation

## 2017-01-04 DIAGNOSIS — J45909 Unspecified asthma, uncomplicated: Secondary | ICD-10-CM | POA: Insufficient documentation

## 2017-01-04 DIAGNOSIS — D869 Sarcoidosis, unspecified: Secondary | ICD-10-CM | POA: Insufficient documentation

## 2017-01-04 DIAGNOSIS — F32A Depression, unspecified: Secondary | ICD-10-CM | POA: Insufficient documentation

## 2017-01-22 NOTE — Progress Notes (Signed)
Cardiology Office Note:    Date:  01/23/2017   ID:  [name], DOB 01/07/1956, MRN [id number]  PCP:  Georgann Housekeeper, MD  Cardiologist:  No primary care provider on file.    Referring MD: Georgann Housekeeper, MD   Chief Complaint  Patient presents with  . Cardiomyopathy  . Hypertension  . Congestive Heart Failure    History of Present Illness:    [name] is a 62 y.o. male with a hx of  with a hx of nonischemic DCM (normal coronary arteries by cath 1996), Sarcoidosis, chronic systolic CHF, HTN, CHB s/p dual chamber AICD and SVT s/p ablation.  He is here today for followup and is doing well.  HE denies any chest pain or pressure, SOB, DOE, PND, orthopnea,  dizziness, palpitations or syncope. He has chronic LE edema controlled on diuretics.  He is compliant with his meds and is tolerating meds with no SE.     Past Medical History:  Diagnosis Date  . Asthma   . Automatic implantable cardioverter-defibrillator in situ   . Back pain   . Chronic systolic CHF (congestive heart failure), NYHA class 3 (HCC)   . Complete heart block [facility])    s/p PPM 1997 with upgrade to AICD 2011  . Depression   . Dilated aortic root (HCC)    91mm ascending aortic root  . DM (diabetes mellitus) (HCC)   . Glaucoma   . HTN (hypertension)   . Hyperlipidemia   . Nonischemic cardiomyopathy (HCC)    EF of 15-20% by echo 2015 and 20% by nuclear stress test 2017  . Obesity   . Sarcoid   . Sleep apnea 06-28-12   no cpap used-unable to tolerate  . SVT (supraventricular tachycardia) (HCC)    s/p ablation    Past Surgical History:  Procedure Laterality Date  . COLONOSCOPY WITH PROPOFOL N/A 07/17/2012   Procedure: COLONOSCOPY WITH PROPOFOL;  Surgeon: Charolett Bumpers, MD;  Location: [facility];  Service: Endoscopy;  Laterality: N/A;  . HEMORRHOID SURGERY     early 20's  . PACEMAKER INSERTION    . TONSILLECTOMY      Current Medications: Current Meds  Medication Sig  . acetaminophen  (TYLENOL) 650 MG CR tablet Take 650 mg by mouth every 8 (eight) hours as needed for pain.  Marland Kitchen albuterol (PROVENTIL HFA;VENTOLIN HFA) 108 (90 BASE) MCG/ACT inhaler Inhale 2 puffs into the lungs every 6 (six) hours as needed. For shortness of breath  . Copper Gluconate (COPPER CAPS PO) Take by mouth.  . Fluticasone Furoate (ARNUITY ELLIPTA) 100 MCG/ACT AEPB Inhale 1 puff into the lungs daily.  . furosemide (LASIX) 40 MG tablet TAKE 1 TABLET BY MOUTH EVERY DAY  . insulin glargine (LANTUS) 100 UNIT/ML injection Inject 50 Units into the skin 2 (two) times daily.   . IRON PO Take 1 tablet by mouth as directed. OTC-Patient unsure of dosage   . Liraglutide (VICTOZA) 18 MG/3ML SOPN Inject 1.2 mg into the skin daily.  . Maca 500 MG CAPS Take 500 mg by mouth daily.  . methocarbamol (ROBAXIN) 500 MG tablet Take 500 mg by mouth 3 (three) times daily as needed for pain.  . metoprolol tartrate (LOPRESSOR) 25 MG tablet Take 1 tablet (25 mg total) by mouth 2 (two) times daily.  . rivaroxaban (XARELTO) 20 MG TABS tablet Take 1 tablet (20 mg total) by mouth daily with supper.  . sacubitril-valsartan (ENTRESTO) 97-103 MG Take 1 tablet by mouth 2 (two) times  daily.  Marland Kitchen SHINGRIX injection   . simvastatin (ZOCOR) 40 MG tablet Take 40 mg by mouth every evening.  . tadalafil (CIALIS) 10 MG tablet Take 1 tablet (10 mg total) by mouth daily as needed for erectile dysfunction.  Marland Kitchen zinc gluconate 50 MG tablet Take 50 mg by mouth daily.     Allergies:   Penicillins   Social History   Socioeconomic History  . Marital status: Married    Spouse name: None  . Number of children: None  . Years of education: None  . Highest education level: None  Social Needs  . Financial resource strain: None  . Food insecurity - worry: None  . Food insecurity - inability: None  . Transportation needs - medical: None  . Transportation needs - non-medical: None  Occupational History  . None  Tobacco Use  . Smoking status: Never  Smoker  . Smokeless tobacco: Never Used  Substance and Sexual Activity  . Alcohol use: Yes    Comment: occasionally  . Drug use: No  . Sexual activity: Yes  Other Topics Concern  . None  Social History Narrative  . None     Family History: The patient's family history includes Alzheimer's disease in his mother; CVA in his father; Diabetes in his brother, mother, and sister; Epilepsy in his sister; Heart failure in his father.  ROS:   Please see the history of present illness.    Review of Systems  Respiratory: Positive for cough and wheezing.   Musculoskeletal: Positive for back pain.    All other systems reviewed and negative.   EKGs/Labs/Other Studies Reviewed:    The following studies were reviewed today: none  EKG:  EKG is ordered today and showed NSR with LBBB and PAC  Recent Labs: 09/23/2016: BUN 8; Creatinine, Ser 1.02; Potassium 4.2; Sodium 142   Recent Lipid Panel    Component Value Date/Time   CHOL  09/22/2009 0330    134        ATP III CLASSIFICATION:  <200     mg/dL   Desirable  244-010  mg/dL   Borderline High  >=272    mg/dL   High          TRIG 536 09/22/2009 0330   HDL 47 09/22/2009 0330   CHOLHDL 2.9 09/22/2009 0330   VLDL 25 09/22/2009 0330   LDLCALC  09/22/2009 0330    62        Total Cholesterol/HDL:CHD Risk Coronary Heart Disease Risk Table                     Men   Women  1/2 Average Risk   3.4   3.3  Average Risk       5.0   4.4  2 X Average Risk   9.6   7.1  3 X Average Risk  23.4   11.0        Use the calculated Patient Ratio above and the CHD Risk Table to determine the patient's CHD Risk.        ATP III CLASSIFICATION (LDL):  <100     mg/dL   Optimal  644-034  mg/dL   Near or Above                    Optimal  130-159  mg/dL   Borderline  742-595  mg/dL   High  >638     mg/dL   Very High    Physical  Exam:    VS:  BP (!) 144/98   Pulse 98   Ht 5\' 10"  (1.778 m)   Wt (!) 378 lb 3.2 oz (171.6 kg)   BMI 54.27 kg/m      Wt Readings from Last 3 Encounters:  01/23/17 (!) 378 lb 3.2 oz (171.6 kg)  11/09/16 (!) 369 lb (167.4 kg)  10/21/16 (!) 369 lb 12.8 oz (167.7 kg)     GEN:  Well nourished, well developed in no acute distress HEENT: Normal NECK: No JVD; No carotid bruits LYMPHATICS: No lymphadenopathy CARDIAC: RRR, no murmurs, rubs, gallops RESPIRATORY:  Clear to auscultation without rales, wheezing or rhonchi  ABDOMEN: Soft, non-tender, non-distended MUSCULOSKELETAL:  No edema; No deformity  SKIN: Warm and dry NEUROLOGIC:  Alert and oriented x 3 PSYCHIATRIC:  Normal affect   ASSESSMENT:    1. Chronic systolic heart failure (HCC)   2. Nonischemic cardiomyopathy (HCC)   3. Essential hypertension   4. Complete heart block (HCC)   5. SVT (supraventricular tachycardia) (HCC)   6. Dilated aortic root (HCC)    PLAN:    In order of problems listed above:  1.  Chronic systolic heart failure. He is NYHA Class III.  He appears euvolemic on exam today.  Weight is stable.  He was instructed to continue Entresto 97-103mg  BID, aldactone 25mg  daily, carvedilol 25mg  BID and Lasix 40mg  daily.  I will repeat a BMET to make sure his renal function is stable.    2.  NIDCM - last echo 02/2016 showed EF 15%.  He is s/p dual chamber AICD.    3.  HTN - BP is elevated on exam today.  Normally at home it runs 125/17mmHg.  He ate a bunch of sodium with his ribs yesterday and attributes that to elevated BP today.  He will continue on Entresto, aldactone, carvedilol.    4.  CHB s/p dual chamber AICD followed in device clinic.   5.  SVT s/p ablation - he has had no reoccurence of arrhythmias.    6.  Dilated aortic root - 40mm by echo 02/2016 - limited echo to assess for progression pending next month.     Medication Adjustments/Labs and Tests Ordered: Current medicines are reviewed at length with the patient today.  Concerns regarding medicines are outlined above.  No orders of the defined types were placed in this  encounter.  No orders of the defined types were placed in this encounter.   Signed, [name], MD  01/23/2017 1:03 PM    [facility]

## 2017-01-23 ENCOUNTER — Ambulatory Visit: Payer: Medicare Other | Admitting: Cardiology

## 2017-01-23 ENCOUNTER — Encounter: Payer: Self-pay | Admitting: Cardiology

## 2017-01-23 VITALS — BP 144/98 | HR 98 | Ht 70.0 in | Wt 378.2 lb

## 2017-01-23 DIAGNOSIS — I10 Essential (primary) hypertension: Secondary | ICD-10-CM

## 2017-01-23 DIAGNOSIS — I471 Supraventricular tachycardia: Secondary | ICD-10-CM | POA: Diagnosis not present

## 2017-01-23 DIAGNOSIS — I428 Other cardiomyopathies: Secondary | ICD-10-CM

## 2017-01-23 DIAGNOSIS — I442 Atrioventricular block, complete: Secondary | ICD-10-CM | POA: Diagnosis not present

## 2017-01-23 DIAGNOSIS — I5022 Chronic systolic (congestive) heart failure: Secondary | ICD-10-CM | POA: Diagnosis not present

## 2017-01-23 DIAGNOSIS — I77810 Thoracic aortic ectasia: Secondary | ICD-10-CM

## 2017-01-23 NOTE — Patient Instructions (Signed)
Medication Instructions:  Your physician recommends that you continue on your current medications as directed. Please refer to the Current Medication list given to you today.  Labwork: Today for kidney function  Testing/Procedures: None ordered   Follow-Up: Your physician wants you to follow-up in: 1 year with Dr. [name]. You will receive a reminder letter in the mail two months in advance. If you don't receive a letter, please call our office to schedule the follow-up appointment.  Your physician wants you to follow-up in: 6 months with PA. You will receive a reminder letter in the mail two months in advance. If you don't receive a letter, please call our office to schedule the follow-up appointment.  Any Other Special Instructions Will Be Listed Below (If Applicable).     If you need a refill on your cardiac medications before your next appointment, please call your pharmacy.

## 2017-01-24 LAB — BASIC METABOLIC PANEL
BUN / CREAT RATIO: 12 (ref 10–24)
BUN: 13 mg/dL (ref 8–27)
CHLORIDE: 105 mmol/L (ref 96–106)
CO2: 24 mmol/L (ref 20–29)
Calcium: 9.8 mg/dL (ref 8.6–10.2)
Creatinine, Ser: 1.07 mg/dL (ref 0.76–1.27)
GFR calc non Af Amer: 75 mL/min/{1.73_m2} (ref >59)
GFR, EST AFRICAN AMERICAN: 86 mL/min/{1.73_m2} (ref >59)
Glucose: 97 mg/dL (ref 65–99)
Potassium: 4.1 mmol/L (ref 3.5–5.2)
Sodium: 144 mmol/L (ref 134–144)

## 2017-01-24 NOTE — Addendum Note (Signed)
Addended by: Madalyn Rob A on: 01/24/2017 04:21 PM   Modules accepted: Orders

## 2017-02-03 ENCOUNTER — Other Ambulatory Visit: Payer: Self-pay | Admitting: *Deleted

## 2017-02-03 MED ORDER — SACUBITRIL-VALSARTAN 97-103 MG PO TABS: 1.0000 | ORAL_TABLET | Freq: Two times a day (BID) | ORAL | 3 refills | Status: DC

## 2017-02-09 ENCOUNTER — Ambulatory Visit (INDEPENDENT_AMBULATORY_CARE_PROVIDER_SITE_OTHER): Payer: Medicare Other | Admitting: *Deleted

## 2017-02-09 DIAGNOSIS — I428 Other cardiomyopathies: Secondary | ICD-10-CM | POA: Diagnosis not present

## 2017-02-09 NOTE — Progress Notes (Signed)
Remote ICD transmission.   

## 2017-02-10 ENCOUNTER — Encounter: Payer: Self-pay | Admitting: Cardiology

## 2017-02-20 ENCOUNTER — Other Ambulatory Visit: Payer: Self-pay

## 2017-02-20 ENCOUNTER — Ambulatory Visit (HOSPITAL_COMMUNITY): Payer: Medicare Other | Attending: Cardiology

## 2017-02-20 DIAGNOSIS — I77810 Thoracic aortic ectasia: Secondary | ICD-10-CM | POA: Diagnosis present

## 2017-02-20 DIAGNOSIS — I517 Cardiomegaly: Secondary | ICD-10-CM | POA: Diagnosis not present

## 2017-02-20 DIAGNOSIS — I081 Rheumatic disorders of both mitral and tricuspid valves: Secondary | ICD-10-CM | POA: Insufficient documentation

## 2017-02-20 DIAGNOSIS — I420 Dilated cardiomyopathy: Secondary | ICD-10-CM | POA: Diagnosis not present

## 2017-02-20 DIAGNOSIS — I5030 Unspecified diastolic (congestive) heart failure: Secondary | ICD-10-CM | POA: Insufficient documentation

## 2017-02-20 MED ORDER — PERFLUTREN LIPID MICROSPHERE
1.0000 mL | INTRAVENOUS | Status: AC | PRN
  Administered 2017-02-20: 2 mL via INTRAVENOUS

## 2017-02-24 LAB — CUP PACEART REMOTE DEVICE CHECK
Battery Voltage: 2.77 V
Brady Statistic AP VP Percent: 3.4 %
Brady Statistic AS VP Percent: 96 %
Brady Statistic AS VS Percent: 1 %
Brady Statistic RV Percent Paced: 99 %
Date Time Interrogation Session: 20190131094540
HIGH POWER IMPEDANCE MEASURED VALUE: 41 Ohm
Implantable Lead Implant Date: 19961213
Implantable Lead Location: 753860
Implantable Lead Model: 5524
Lead Channel Impedance Value: 430 Ohm
Lead Channel Pacing Threshold Amplitude: 0.75 V
Lead Channel Pacing Threshold Pulse Width: 0.5 ms
Lead Channel Sensing Intrinsic Amplitude: 1.7 mV
Lead Channel Setting Pacing Amplitude: 2 V
Lead Channel Setting Pacing Pulse Width: 0.5 ms
MDC IDC LEAD IMPLANT DT: 20111202
MDC IDC LEAD LOCATION: 753859
MDC IDC MSMT BATTERY REMAINING LONGEVITY: 18 mo
MDC IDC MSMT BATTERY REMAINING PERCENTAGE: 20 %
MDC IDC MSMT LEADCHNL RA PACING THRESHOLD AMPLITUDE: 0.75 V
MDC IDC MSMT LEADCHNL RA PACING THRESHOLD PULSEWIDTH: 0.5 ms
MDC IDC MSMT LEADCHNL RV IMPEDANCE VALUE: 350 Ohm
MDC IDC MSMT LEADCHNL RV SENSING INTR AMPL: 12 mV
MDC IDC PG IMPLANT DT: 20111202
MDC IDC PG SERIAL: 629155
MDC IDC SET LEADCHNL RA PACING AMPLITUDE: 2 V
MDC IDC SET LEADCHNL RV SENSING SENSITIVITY: 2 mV
MDC IDC STAT BRADY AP VS PERCENT: 1 %
MDC IDC STAT BRADY RA PERCENT PACED: 3.1 %

## 2017-03-09 ENCOUNTER — Other Ambulatory Visit: Payer: Self-pay | Admitting: Internal Medicine

## 2017-03-09 DIAGNOSIS — I5022 Chronic systolic (congestive) heart failure: Secondary | ICD-10-CM

## 2017-04-06 ENCOUNTER — Encounter: Payer: Self-pay | Admitting: Internal Medicine

## 2017-04-07 LAB — HM DIABETES EYE EXAM

## 2017-05-01 ENCOUNTER — Telehealth: Payer: Self-pay | Admitting: Cardiology

## 2017-05-01 NOTE — Telephone Encounter (Signed)
Spoke w/ pt and requested that he send a manual transmission b/c his home monitor has not updated in at least 7 days.   

## 2017-05-09 ENCOUNTER — Encounter: Payer: Self-pay | Admitting: Internal Medicine

## 2017-05-09 ENCOUNTER — Ambulatory Visit: Payer: Medicare Other | Admitting: Internal Medicine

## 2017-05-09 VITALS — BP 130/82 | HR 90 | Ht 70.0 in | Wt 367.2 lb

## 2017-05-09 DIAGNOSIS — R05 Cough: Secondary | ICD-10-CM

## 2017-05-09 DIAGNOSIS — J4530 Mild persistent asthma, uncomplicated: Secondary | ICD-10-CM

## 2017-05-09 DIAGNOSIS — R053 Chronic cough: Secondary | ICD-10-CM

## 2017-05-09 NOTE — Progress Notes (Signed)
Subjective:     Patient ID: [name], male   DOB: 09/30/1955, 62 y.o.   MRN: [id number]  HPI   IOV 05/22/2015  Chief Complaint  Patient presents with  . Pulmonary Consult    Referred by [name]; Asthma/ Sarcoidosis; SOB; chest tightness.  non-productive cough.    62 year-old morbidly obese male with nonischemic cardiac myopathy and chronic back problems and sleep apnea followed by Dr. [name] referred for evaluation of possible sarcoidosis and asthma.  He migrated here in 20 from [place]. At that time he was in his 30s. He says he had like myalgias and was diagnosed with pulmonary sarcoidosis by Dr. [name] at [facility]. He remembers having a bronchoscopy and transbronchial biopsy. Further details unknown. Other than the fact he took prednisone for few to several months. He says over the next few years as sarcoidosis resolved according to follow at [name]. And then some 15 years ago was diagnosed with nonischemic cardiomyopathy and his been disabled. Then some 20 years ago was diagnosed to have asthma by his primary care physician. He had asthma when away but in the last year or so it has come back. For the last 6 months he been using albuterol rescue at least several times daily. It is associated with wheeze. Symptoms of asthma include dyspnea on exertion and weakness at rest. Wheezing is made worse by cold temperature and relieved by albuterol. Moderate in intensity. There is no fever or sputum production.   Asthma control questionnaire.: He never wakes up in the middle of the night because of asthma symptoms. When he wakes up in the morning he perceives very mild symptoms. He feels very mildly limited because of asthma with his activities. He experiences a moderate amount of shortness of breath with exertion relieved by rest but also made worse by cold temperature and relieved by albuterol which he perceives is because of asthma. He wheezes a moderate amount of the  time and uses albuterol for rescue at least 3-4 puffs daily. 5. question as 1.6 and shows active symptoms.   FEno  - 29ppb and borderline - this is with patient taking albuterol as needed. He is not on inhaled steroids. He is noted to be a nonspecific beta blocker carvedilol. Visualization of 5 chest x-ray from 2007 all the way to 2016 show clear lung fields without any mediastinal adenopathy. He does not have any CT chest blood work review shows hemoglobin 12 g percent and normal creatinine.     06/23/2015 follow up Returns for a one-month follow-up. Patient was seen last visit for pulmonary consult for possible sarcoidosis and asthma. Patient was being seen in [facility] by pulmonologist. Diagnosed with sarcoid years ago. He has been on and off of prednisone several times in the past.  Last ov  , FENO was 29. Patient was set up for a CT chest that showed mild irregular thickening along the right major fissure, scattered pulmonary nodules in the right lung. Parenchymal bands in the lingula and left lower lobe. These could represent nonspecific findings associated with sarcoid. No other evidence of interstitial lung disease was noted. Mild subcarinal and bilateral hilar adenopathy. Pulmonary function test performed today showed an FEV1 at 62%, ratio 83, FVC 58%, no significant bronchodilator response, total lung capacity 63%, DLCO 70% Patient was started on Pulmicort last visit. Patient feels that this has improved his shortness of breath and cough.     OV 09/23/2015  Chief Complaint  Patient  presents with  . Follow-up    Pt states his SOBhas improved since last OV in 06/2015. Pt c/o prod cough with clear mucus. Pt denies CP/tightness and f/c/s.    82o-year-old morbidly obese male with a history of sarcoidosis in the past. Originally seen in May 2017 for wheezing. Evaluation showed that he had burned out sarcoidosis. Exhaled nitric oxide was slightly elevated at 29 ppb. Restart him on inhaled  steroids. He is taking Flovent. This significantly improved his symptoms. Currently he tells me that he continues to maintain a wheeze free life. His only symptom is mild occasional cough when he lies down. At this point in time he does not want therapy for this. He thinks he can prop up his head that might help the cough. He will have flu shot today.      OV 05/13/2016  Chief Complaint  Patient presents with  . Follow-up    Pt states his breathing is doing well. Pt states he has an intermittent dry cough at night. Pt denies CP/tightness and f/c/s.    62 year old morbidly obese male with a history of burnt sarcoidosis in the past. Now with a diagnosis of mild persistent asthma based on borderline exhaled nitric oxide and therapeutic response to Flovent in terms of his dyspnea wheeze and cough. At last visit he still had some residual cough for which she did not want change in therapy. He is happy with his Flovent. He is somewhat reluctant to change it. Nevertheless he is only taking his Flovent once daily. He does have cough at night as a dry cough. However it is mild. He doesn't seem bothered by it. Of note he does have sleep apnea which is managed by his primary care physician but he says that he is noncompliant using his CPAP.   OV 11/09/2016  . Chief Complaint  Patient presents with  . Follow-up    still has dry cough/not wearing CPAP   62 year old male withmorbid obesity and burned out sarcoidosis in the past.Now being treated for mild persistent asthma based on 4 nitric oxide and therapeutic response to inhaled steroid  Last visit May 2018. At that time he was doing well with her Flovent we switched him to inhaled steroid  arnuity and he tells me he was doing well with it. Then approximately one or 2 months ago cardiology Dr. [name] changed his medications according to his history. Review of the chart shows that indeed there was several changes including a recent increase of this month  October 2018 on his carvedilol. He had a CT scan June 2018 that shows evidence of burn doubt sarcoidosis as documented below. His nodules resolved.. The main issues that since the cardiology medication changes having worsening cough is dry   Results for IVOR, KISHI (MRN [id number]) as of 11/09/2016 12:07  Ref. Range 05/22/2015 16:27 11/09/2016   Nitric Oxide Unknown 29 15 per RN   IMPRESSION: 1. Previously noted small pulmonary nodules are either stable or have resolved compared to the prior study, and are considered benign. 2. Scattered areas of bronchiectasis predominantly in the lung bases. 3. Very mild subpleural nodularity, most evident along the right major fissure. This could conceivably be a manifestation of reported sarcoidosis, but these changes are very mild. 4. Aortic atherosclerosis, in addition to 3 vessel coronary artery disease. Please note that although the presence of coronary artery calcium documents the presence of coronary artery disease, the severity of this disease and any potential stenosis cannot be assessed  on this non-gated CT examination. Assessment for potential risk factor modification, dietary therapy or pharmacologic therapy may be warranted, if clinically indicated. 5. Mild cardiomegaly. 6. Additional incidental findings, as above.  Aortic Atherosclerosis (ICD10-I70.0).   Electronically Signed   By: Trudie Reed M.D.   On: 06/23/2016 14:13   OV 05/09/2017  Chief Complaint  Patient presents with  . Follow-up    Pt states he has been doing good. States he is still coughing but cough is better.     62 year old male with morbid obesity and burned out sarcoidosis in the past.Now being treated for mild persistent asthma based on borderline elevated nitric oxide and therapeutic response to inhaled steroid  Mrs. [facility] presents for follow-up of cough with clinical asthma.  He continues to do well inhaled corticosteroid  but he still has some residual cough that is not going away. Apparently his brother died from lung cancer recently. He is worried about lung cancer.He hasno smoking history other than occasional rare cigars. He did have a ET scan of the chest without contrast without any evidence of cancer in June 2018. Therefore I've advised him that the probability of lung cancer right now is extremely low. But he still does have some ongoing cough. He feels that he. needs ENT referral because he clears his throat. Med review shows that he still on carvedilol. He is upcoming cardiology appointment with Dr. Ladona Ridgel cardiologist    has a past medical history of Asthma, Automatic implantable cardioverter-defibrillator in situ, Back pain, Chronic systolic CHF (congestive heart failure), NYHA class 3 (HCC), Complete heart block (HCC), Depression, Dilated aortic root (HCC), DM (diabetes mellitus) (HCC), Glaucoma, HTN (hypertension), Hyperlipidemia, Nonischemic cardiomyopathy (HCC), Obesity, Sarcoid, Sleep apnea (06-28-12), and SVT (supraventricular tachycardia) (HCC).   reports that he has never smoked. He has never used smokeless tobacco.  Past Surgical History:  Procedure Laterality Date  . COLONOSCOPY WITH PROPOFOL N/A 07/17/2012   Procedure: COLONOSCOPY WITH PROPOFOL;  Surgeon: Charolett Bumpers, MD;  Location: [facility];  Service: Endoscopy;  Laterality: N/A;  . HEMORRHOID SURGERY     early 20's  . PACEMAKER INSERTION    . TONSILLECTOMY      Allergies  Allergen Reactions  . Penicillins Other (See Comments)    dizziness    Immunization History  Administered Date(s) Administered  . Influenza, High Dose Seasonal PF 09/23/2015  . Influenza,inj,Quad PF,6+ Mos 10/03/2016  . Influenza-Unspecified 11/11/2014  . Pneumococcal-Unspecified 01/11/2015    Family History  Problem Relation Age of Onset  . Diabetes Mother   . Alzheimer's disease Mother   . CVA Father   . Heart failure Father   . Epilepsy Sister    . Diabetes Brother   . Diabetes Sister      Current Outpatient Medications:  .  acetaminophen (TYLENOL) 650 MG CR tablet, Take 650 mg by mouth every 8 (eight) hours as needed for pain., Disp: , Rfl:  .  albuterol (PROVENTIL HFA;VENTOLIN HFA) 108 (90 BASE) MCG/ACT inhaler, Inhale 2 puffs into the lungs every 6 (six) hours as needed. For shortness of breath, Disp: , Rfl:  .  carvedilol (COREG) 25 MG tablet, Take 25 mg by mouth 2 (two) times daily with a meal., Disp: , Rfl:  .  Copper Gluconate (COPPER CAPS PO), Take by mouth., Disp: , Rfl:  .  Fluticasone Furoate (ARNUITY ELLIPTA) 100 MCG/ACT AEPB, Inhale 1 puff into the lungs daily., Disp: 30 each, Rfl: 11 .  furosemide (LASIX) 40 MG tablet, TAKE  1 TABLET BY MOUTH EVERY DAY, Disp: 90 tablet, Rfl: 0 .  insulin glargine (LANTUS) 100 UNIT/ML injection, Inject 50 Units into the skin 2 (two) times daily. , Disp: , Rfl:  .  IRON PO, Take 1 tablet by mouth as directed. OTC-Patient unsure of dosage , Disp: , Rfl:  .  Liraglutide (VICTOZA) 18 MG/3ML SOPN, Inject 1.2 mg into the skin daily., Disp: , Rfl:  .  metoprolol tartrate (LOPRESSOR) 25 MG tablet, Take 25 mg by mouth 2 (two) times daily., Disp: , Rfl:  .  rivaroxaban (XARELTO) 20 MG TABS tablet, Take 1 tablet (20 mg total) by mouth daily with supper., Disp: 30 tablet, Rfl: 11 .  sacubitril-valsartan (ENTRESTO) 97-103 MG, Take 1 tablet by mouth 2 (two) times daily., Disp: 180 tablet, Rfl: 3 .  simvastatin (ZOCOR) 40 MG tablet, Take 40 mg by mouth every evening., Disp: , Rfl:  .  spironolactone (ALDACTONE) 25 MG tablet, TAKE 0.5 TABLETS (12.5 MG TOTAL) BY MOUTH DAILY., Disp: , Rfl: 11 .  tadalafil (CIALIS) 10 MG tablet, Take 1 tablet (10 mg total) by mouth daily as needed for erectile dysfunction., Disp: 10 tablet, Rfl: 3 .  carvedilol (COREG) 25 MG tablet, Take 1 tablet (25 mg total) by mouth 2 (two) times daily., Disp: 180 tablet, Rfl: 3 .  metoprolol tartrate (LOPRESSOR) 25 MG tablet, Take 1  tablet (25 mg total) by mouth 2 (two) times daily., Disp: 180 tablet, Rfl: 3 .  spironolactone (ALDACTONE) 25 MG tablet, Take 0.5 tablets (12.5 mg total) by mouth daily., Disp: 15 tablet, Rfl: 11 .  zinc gluconate 50 MG tablet, Take 50 mg by mouth daily., Disp: , Rfl:   Review of Systems     Objective:   Physical Exam  Constitutional: He is oriented to person, place, and time. He appears well-developed and well-nourished. No distress.  HENT:  Head: Normocephalic and atraumatic.  Right Ear: External ear normal.  Left Ear: External ear normal.  Mouth/Throat: Oropharynx is clear and moist. No oropharyngeal exudate.  Eyes: Pupils are equal, round, and reactive to light. Conjunctivae and EOM are normal. Right eye exhibits no discharge. Left eye exhibits no discharge. No scleral icterus.  Neck: Normal range of motion. Neck supple. No JVD present. No tracheal deviation present. No thyromegaly present.  Cardiovascular: Normal rate, regular rhythm and intact distal pulses. Exam reveals no gallop and no friction rub.  No murmur heard. Pulmonary/Chest: Effort normal and breath sounds normal. No respiratory distress. He has no wheezes. He has no rales. He exhibits no tenderness.  Abdominal: Soft. Bowel sounds are normal. He exhibits no distension and no mass. There is no tenderness. There is no rebound and no guarding.  Musculoskeletal: Normal range of motion. He exhibits no edema or tenderness.  Lymphadenopathy:    He has no cervical adenopathy.  Neurological: He is alert and oriented to person, place, and time. He has normal reflexes. No cranial nerve deficit. Coordination normal.  Skin: Skin is warm and dry. No rash noted. He is not diaphoretic. No erythema. No pallor.  Psychiatric: He has a normal mood and affect. His behavior is normal. Judgment and thought content normal.  Nursing note and vitals reviewed.  Vitals:   05/09/17 1212  BP: 130/82  Pulse: 90  SpO2: 99%    Estimated body mass  index is 54.27 kg/m as calculated from the following:   Height as of 01/23/17: 5\' 10"  (1.778 m).   Weight as of 01/23/17: 378 lb 3.2 oz (  171.6 kg).     Assessment:       ICD-10-CM   1. Mild persistent asthma, uncomplicated J45.30   2. Chronic cough R05        Plan:      Glad better with arnuity Too bad still some residual cough  Plan D/w Dr Ladona Ridgel cardiology during your office visit if they can do some other medication other than coreg for your heart  Refer ENT  Continue arnuity as before  Followup 6 months of sooner if needed    Dr. Kalman Shan, M.D., [facility].C.P Pulmonary and Critical Care Medicine Staff Physician, [facility] Director - Interstitial Lung Disease  Program  Pulmonary Fibrosis [facility] at [facility] [place], [place], [address]  Pager: [phone], If no answer or between  15:00h - 7:00h: call [phone] Telephone: [phone]

## 2017-05-09 NOTE — Patient Instructions (Addendum)
ICD-10-CM   1. Mild persistent asthma, uncomplicated J45.30   2. Chronic cough R05     Glad better with arnuity Too bad still some residual cough  Plan D/w Dr Ladona Ridgel cardiology during your office visit if they can do some other medication other than coreg for your heart  Refer ENT  Continue arnuity as before  Followup 6 months of sooner if needed

## 2017-05-09 NOTE — Addendum Note (Signed)
Addended by: Wyvonne Lenz on: 05/09/2017 12:27 PM   Modules accepted: Orders

## 2017-05-11 ENCOUNTER — Ambulatory Visit (INDEPENDENT_AMBULATORY_CARE_PROVIDER_SITE_OTHER): Payer: Medicare Other | Admitting: *Deleted

## 2017-05-11 DIAGNOSIS — I428 Other cardiomyopathies: Secondary | ICD-10-CM | POA: Diagnosis not present

## 2017-05-11 NOTE — Progress Notes (Signed)
Remote ICD transmission.   

## 2017-05-15 ENCOUNTER — Ambulatory Visit: Payer: Medicare Other | Admitting: Internal Medicine

## 2017-05-16 ENCOUNTER — Encounter: Payer: Self-pay | Admitting: Cardiology

## 2017-05-16 ENCOUNTER — Ambulatory Visit: Payer: Medicare Other | Admitting: Internal Medicine

## 2017-05-16 ENCOUNTER — Encounter: Payer: Self-pay | Admitting: Internal Medicine

## 2017-05-16 VITALS — BP 128/72 | HR 82 | Ht 70.0 in | Wt 368.0 lb

## 2017-05-16 DIAGNOSIS — Z95810 Presence of automatic (implantable) cardiac defibrillator: Secondary | ICD-10-CM | POA: Diagnosis not present

## 2017-05-16 DIAGNOSIS — I5022 Chronic systolic (congestive) heart failure: Secondary | ICD-10-CM | POA: Diagnosis not present

## 2017-05-16 DIAGNOSIS — I428 Other cardiomyopathies: Secondary | ICD-10-CM

## 2017-05-16 DIAGNOSIS — I442 Atrioventricular block, complete: Secondary | ICD-10-CM | POA: Diagnosis not present

## 2017-05-16 NOTE — Patient Instructions (Signed)

## 2017-05-16 NOTE — Progress Notes (Signed)
HPI [name] returns today for followup. He is a very pleasant 62 year old man with morbid obesity, a nonischemic cardiomyopathy, complete heart block, status post ICD implantation. When his initial ICD was placed, the patient was unable to receive a left ventricular lead secondary to severe scar tissue and a subtotally occluded subclavian vein. In the interim, he has been stable. He denies chest pain but has had some worsening dyspnea. He has minimal peripheral edema. He denies syncope. He is frustrated by his inability to lose and keep off weight. He admits to dietary indiscretion.  Allergies  Allergen Reactions  . Penicillins Other (See Comments)    dizziness     Current Outpatient Medications  Medication Sig Dispense Refill  . acetaminophen (TYLENOL) 650 MG CR tablet Take 650 mg by mouth every 8 (eight) hours as needed for pain.    Marland Kitchen albuterol (PROVENTIL HFA;VENTOLIN HFA) 108 (90 BASE) MCG/ACT inhaler Inhale 2 puffs into the lungs every 6 (six) hours as needed. For shortness of breath    . carvedilol (COREG) 25 MG tablet Take 25 mg by mouth 2 (two) times daily with a meal.    . Copper Gluconate (COPPER CAPS PO) Take by mouth.    . Fluticasone Furoate (ARNUITY ELLIPTA) 100 MCG/ACT AEPB Inhale 1 puff into the lungs daily. 30 each 11  . furosemide (LASIX) 40 MG tablet TAKE 1 TABLET BY MOUTH EVERY DAY 90 tablet 0  . insulin glargine (LANTUS) 100 UNIT/ML injection Inject 50 Units into the skin 2 (two) times daily.     . IRON PO Take 1 tablet by mouth as directed. OTC-Patient unsure of dosage     . Liraglutide (VICTOZA) 18 MG/3ML SOPN Inject 1.2 mg into the skin daily.    . metoprolol tartrate (LOPRESSOR) 25 MG tablet Take 25 mg by mouth 2 (two) times daily.    . rivaroxaban (XARELTO) 20 MG TABS tablet Take 1 tablet (20 mg total) by mouth daily with supper. 30 tablet 11  . sacubitril-valsartan (ENTRESTO) 97-103 MG Take 1 tablet by mouth 2 (two) times daily. 180 tablet 3  .  simvastatin (ZOCOR) 40 MG tablet Take 40 mg by mouth every evening.    Marland Kitchen spironolactone (ALDACTONE) 25 MG tablet TAKE 0.5 TABLETS (12.5 MG TOTAL) BY MOUTH DAILY.  11  . tadalafil (CIALIS) 10 MG tablet Take 1 tablet (10 mg total) by mouth daily as needed for erectile dysfunction. 10 tablet 3  . zinc gluconate 50 MG tablet Take 50 mg by mouth daily.    . carvedilol (COREG) 25 MG tablet Take 1 tablet (25 mg total) by mouth 2 (two) times daily. 180 tablet 3  . metoprolol tartrate (LOPRESSOR) 25 MG tablet Take 1 tablet (25 mg total) by mouth 2 (two) times daily. 180 tablet 3  . spironolactone (ALDACTONE) 25 MG tablet Take 0.5 tablets (12.5 mg total) by mouth daily. 15 tablet 11   No current facility-administered medications for this visit.      Past Medical History:  Diagnosis Date  . Asthma   . Automatic implantable cardioverter-defibrillator in situ   . Back pain   . Chronic systolic CHF (congestive heart failure), NYHA class 3 (HCC)   . Complete heart block [facility])    s/p PPM 1997 with upgrade to AICD 2011  . Depression   . Dilated aortic root (HCC)    40mm ascending aortic root  . DM (diabetes mellitus) (HCC)   . Glaucoma   . HTN (hypertension)   .  Hyperlipidemia   . Nonischemic cardiomyopathy (HCC)    EF of 15-20% by echo 2015 and 20% by nuclear stress test 2017  . Obesity   . Sarcoid   . Sleep apnea 06-28-12   no cpap used-unable to tolerate  . SVT (supraventricular tachycardia) (HCC)    s/p ablation    ROS:   All systems reviewed and negative except as noted in the HPI.   Past Surgical History:  Procedure Laterality Date  . COLONOSCOPY WITH PROPOFOL N/A 07/17/2012   Procedure: COLONOSCOPY WITH PROPOFOL;  Surgeon: Charolett Bumpers, MD;  Location: [facility];  Service: Endoscopy;  Laterality: N/A;  . HEMORRHOID SURGERY     early 20's  . PACEMAKER INSERTION    . TONSILLECTOMY       Family History  Problem Relation Age of Onset  . Diabetes Mother   . Alzheimer's  disease Mother   . CVA Father   . Heart failure Father   . Epilepsy Sister   . Diabetes Brother   . Diabetes Sister      Social History   Socioeconomic History  . Marital status: Married    Spouse name: Not on file  . Number of children: Not on file  . Years of education: Not on file  . Highest education level: Not on file  Occupational History  . Not on file  Social Needs  . Financial resource strain: Not on file  . Food insecurity:    Worry: Not on file    Inability: Not on file  . Transportation needs:    Medical: Not on file    Non-medical: Not on file  Tobacco Use  . Smoking status: Never Smoker  . Smokeless tobacco: Never Used  Substance and Sexual Activity  . Alcohol use: Yes    Comment: occasionally  . Drug use: No  . Sexual activity: Yes  Lifestyle  . Physical activity:    Days per week: Not on file    Minutes per session: Not on file  . Stress: Not on file  Relationships  . Social connections:    Talks on phone: Not on file    Gets together: Not on file    Attends religious service: Not on file    Active member of club or organization: Not on file    Attends meetings of clubs or organizations: Not on file    Relationship status: Not on file  . Intimate partner violence:    Fear of current or ex partner: Not on file    Emotionally abused: Not on file    Physically abused: Not on file    Forced sexual activity: Not on file  Other Topics Concern  . Not on file  Social History Narrative  . Not on file     BP 128/72   Pulse 82   Ht 5\' 10"  (1.778 m)   Wt (!) 368 lb (166.9 kg)   SpO2 95%   BMI 52.80 kg/m   Physical Exam:  Morbidly obese appearing 62 year old man, NAD HEENT: Unremarkable Neck: Unable to assess JVD, no thyromegally Lymphatics:  No adenopathy Back:  No CVA tenderness Lungs:  Clear, with no wheezes, rales, or rhonchi HEART: Distant, regular rate rhythm, no murmurs, no rubs, no clicks Abd:  soft, positive bowel sounds, no  organomegally, no rebound, no guarding Ext:  2 plus pulses, no edema, no cyanosis, no clubbing Skin:  No rashes no nodules Neuro:  CN II through XII intact, motor grossly intact  EKG -normal sinus rhythm with ventricular pacing QRS duration 210 ms  DEVICE  Normal device function.  See PaceArt for details.   Assess/Plan: 1.  Chronic systolic heart failure -despite his severe left ventricular dysfunction and pacing induced left bundle branch block, he continues to remain fairly stable with class III a heart failure symptoms.  He is on maximal medical therapy.  He is encouraged to lose weight and maintain a low-sodium diet. 2.  ICD -his [employer] Jude dual-chamber ICD is working normally.  He is unfortunately pacing in the ventricle 99%.  Estimated battery longevity is 1 year.  We discussed resynchronization therapy.  With his likely occluded left subclavian vein, ICD system extraction and insertion of the new ICD as well as LV lead would be one option as would be an epicardial approach with an epicardial lead placed.  We discussed this and some detail today and will do so again when I see him back in several months. 3.  Morbid obesity -the patient is over 150 pounds overweight.  I strongly encourage the patient to start exercising and eat less. 4.  Hypertension -his blood pressure is only minimally elevated today.  He is on good medical therapy.  He denies sodium indiscretion.  Lewayne Bunting, MD

## 2017-05-24 ENCOUNTER — Other Ambulatory Visit: Payer: Self-pay | Admitting: Internal Medicine

## 2017-05-30 LAB — CUP PACEART REMOTE DEVICE CHECK
Battery Remaining Percentage: 15 %
Brady Statistic AP VS Percent: 1 %
Brady Statistic AS VP Percent: 96 %
Brady Statistic AS VS Percent: 1 %
Brady Statistic RA Percent Paced: 3 %
Brady Statistic RV Percent Paced: 99 %
HIGH POWER IMPEDANCE MEASURED VALUE: 40 Ohm
Implantable Lead Implant Date: 20111202
Implantable Lead Location: 753860
Implantable Lead Model: 7121
Lead Channel Impedance Value: 350 Ohm
Lead Channel Pacing Threshold Amplitude: 0.75 V
Lead Channel Pacing Threshold Pulse Width: 0.5 ms
Lead Channel Sensing Intrinsic Amplitude: 11.2 mV
Lead Channel Setting Pacing Amplitude: 2 V
Lead Channel Setting Pacing Amplitude: 2 V
Lead Channel Setting Sensing Sensitivity: 2 mV
MDC IDC LEAD IMPLANT DT: 19961213
MDC IDC LEAD LOCATION: 753859
MDC IDC MSMT BATTERY REMAINING LONGEVITY: 14 mo
MDC IDC MSMT BATTERY VOLTAGE: 2.74 V
MDC IDC MSMT LEADCHNL RA IMPEDANCE VALUE: 460 Ohm
MDC IDC MSMT LEADCHNL RA PACING THRESHOLD PULSEWIDTH: 0.5 ms
MDC IDC MSMT LEADCHNL RA SENSING INTR AMPL: 1.5 mV
MDC IDC MSMT LEADCHNL RV PACING THRESHOLD AMPLITUDE: 0.875 V
MDC IDC PG IMPLANT DT: 20111202
MDC IDC PG SERIAL: 629155
MDC IDC SESS DTM: 20190502162029
MDC IDC SET LEADCHNL RV PACING PULSEWIDTH: 0.5 ms
MDC IDC STAT BRADY AP VP PERCENT: 3.4 %

## 2017-06-02 ENCOUNTER — Other Ambulatory Visit: Payer: Self-pay | Admitting: Internal Medicine

## 2017-06-02 DIAGNOSIS — I5022 Chronic systolic (congestive) heart failure: Secondary | ICD-10-CM

## 2017-08-06 ENCOUNTER — Encounter: Payer: Self-pay | Admitting: Physician Assistant

## 2017-08-10 ENCOUNTER — Telehealth: Payer: Self-pay

## 2017-08-10 ENCOUNTER — Ambulatory Visit (INDEPENDENT_AMBULATORY_CARE_PROVIDER_SITE_OTHER): Payer: Medicare Other | Admitting: *Deleted

## 2017-08-10 ENCOUNTER — Encounter: Payer: Self-pay | Admitting: Cardiology

## 2017-08-10 DIAGNOSIS — I428 Other cardiomyopathies: Secondary | ICD-10-CM

## 2017-08-10 NOTE — Progress Notes (Signed)
Remote ICD transmission.   

## 2017-08-10 NOTE — Telephone Encounter (Signed)
Spoke with pt and reminded pt of remote transmission that is due today. Pt verbalized understanding.   

## 2017-09-12 NOTE — Progress Notes (Signed)
Cardiology Office Note    Date:  09/13/2017   ID:  [name], DOB Aug 28, 1955, MRN [id number]  PCP:  Wenda Low, MD  Cardiologist: Fransico Him, MD EPS Dr. Lovena Le No chief complaint on file.   History of Present Illness:  [name] is a 62 y.o. male with a history of nonischemic dilated cardiomyopathy with normal coronary arteries on cath 4315, chronic systolic CHF, AICD, SVT S/P ablation, HTN, sarcoidosis. LVEF 20% on nuclear stress test 2017, EF of 15% on echo 02/2016, dilated aortic root 40 mm on echo 02/2016.  Limited echo 02/20/2017 LVEF 15 to 20% mild MR and TR moderate RV dysfunction.  Aortic root does not appear enlarged.  Last saw Dr. Radford Pax 01/2017 and was compensated on Entresto and Aldactone.  Patient comes in for regular f/u. Having trouble with weight gain. Ate a lot of chinese food yesterday. Complains of dyspnea on exertion, cough with laying down. No exercise.  Denies chest pain, edema, dizziness or presyncope.   Past Medical History:  Diagnosis Date  . Asthma   . Automatic implantable cardioverter-defibrillator in situ   . Back pain   . Chronic systolic CHF (congestive heart failure), NYHA class 3 ([place])   . Complete heart block [facility])    s/p PPM 1997 with upgrade to AICD 2011  . Depression   . Dilated aortic root (HCC)    35m ascending aortic root  . [place]diabetes [place]) [place]   . Glaucoma   . HTN (hypertension)   . Hyperlipidemia   . [place]ischemic cardiomyopathy ([place]    EF of 15-20% by echo 2015 and 20% by nuclear stress test 2017  . Obesity   . Sarcoid   . Sleep apnea 06-28-12   no cpap used-unable to tolerate  . SVT (supraventricular tachycardia) (HCC)    s/p ablation    Past Surgical History:  Procedure Laterality Date  . COLONOSCOPY WITH PROPOFOL N/A 07/17/2012   Procedure: COLONOSCOPY WITH PROPOFOL;  Surgeon: MGarlan Fair MD;  Location: [facility];  Service: Endoscopy;  Laterality: N/A;  . HEMORRHOID SURGERY     early 20's   . PACEMAKER INSERTION    . TONSILLECTOMY      Current Medications: Current Meds  Medication Sig  . acetaminophen (TYLENOL) 650 MG CR tablet Take 650 mg by mouth every 8 (eight) hours as needed for pain.  .Marland Kitchenalbuterol (PROVENTIL HFA;VENTOLIN HFA) 108 (90 BASE) MCG/ACT inhaler Inhale 2 puffs into the lungs every 6 (six) hours as needed. For shortness of breath  . ARNUITY ELLIPTA 100 MCG/ACT AEPB USE 1 PUFF BY MOUTH INTO THE LUNGS DAILY  . carvedilol (COREG) 25 MG tablet Take 1 tablet (25 mg total) by mouth 2 (two) times daily.  . furosemide (LASIX) 40 MG tablet TAKE 1 TABLET BY MOUTH EVERY DAY  . insulin glargine (LANTUS) 100 UNIT/ML injection Inject 50 Units into the skin 2 (two) times daily.   . Liraglutide (VICTOZA) 18 MG/3ML SOPN Inject 1.2 mg into the skin daily.  . metoprolol tartrate (LOPRESSOR) 25 MG tablet Take 25 mg by mouth 2 (two) times daily.  . rivaroxaban (XARELTO) 20 MG TABS tablet Take 1 tablet (20 mg total) by mouth daily with supper.  . sacubitril-valsartan (ENTRESTO) 97-103 MG Take 1 tablet by mouth 2 (two) times daily.  . simvastatin (ZOCOR) 40 MG tablet Take 40 mg by mouth every evening.  .Marland Kitchenspironolactone (ALDACTONE) 25 MG tablet TAKE 0.5 TABLETS (12.5 MG TOTAL) BY MOUTH DAILY.  Allergies:   Penicillins   Social History   Socioeconomic History  . Marital status: Married    Spouse name: Not on file  . Number of children: Not on file  . Years of education: Not on file  . Highest education level: Not on file  Occupational History  . Not on file  Social Needs  . Financial resource strain: Not on file  . Food insecurity:    Worry: Not on file    Inability: Not on file  . Transportation needs:    Medical: Not on file    Non-medical: Not on file  Tobacco Use  . Smoking status: Never Smoker  . Smokeless tobacco: Never Used  Substance and Sexual Activity  . Alcohol use: Yes    Comment: occasionally  . Drug use: No  . Sexual activity: Yes  Lifestyle  .  Physical activity:    Days per week: Not on file    Minutes per session: Not on file  . Stress: Not on file  Relationships  . Social connections:    Talks on phone: Not on file    Gets together: Not on file    Attends religious service: Not on file    Active member of club or organization: Not on file    Attends meetings of clubs or organizations: Not on file    Relationship status: Not on file  Other Topics Concern  . Not on file  Social History Narrative  . Not on file     Family History:  The patient's family history includes Alzheimer's disease in his mother; CVA in his father; Diabetes in his brother, mother, and sister; Epilepsy in his sister; Heart failure in his father.   ROS:   Please see the history of present illness.    Review of Systems  Constitution: Positive for weight gain.  HENT: Negative.   Cardiovascular: Positive for dyspnea on exertion.  Respiratory: Positive for cough.   Endocrine: Negative.   Hematologic/Lymphatic: Negative.   Musculoskeletal: Positive for back pain and myalgias.  Gastrointestinal: Negative.   Genitourinary: Negative.   Neurological: Negative.    All other systems reviewed and are negative.   PHYSICAL EXAM:   VS:  BP 112/80   Pulse 80   Ht _0  (1.778 m)   Wt (!) 377 lb (171 kg)   BMI 54.09 kg/m   Physical Exam  GEN: Obese, in no acute distress  Neck: no JVD, carotid bruits, or masses Cardiac:RRR; no murmurs, rubs, or gallops  Respiratory: Decreased breath sounds but clear to auscultation bilaterally, normal work of breathing GI: soft, nontender, nondistended, + BS Ext: without cyanosis, clubbing, or edema, Good distal pulses bilaterally Neuro:  Alert and Oriented x 3, Strength and sensation are intact Psych: euthymic mood, full affect  Wt Readings from Last 3 Encounters:  09/13/17 (!) 377 lb (171 kg)  05/16/17 (!) 368 lb (166.9 kg)  05/09/17 (!) 367 lb 3.2 oz (166.6 kg)      Studies/Labs Reviewed:   EKG:  EKG is  not ordered today.   Recent Labs: 01/23/2017: BUN 13; Creatinine, Ser 1.07; Potassium 4.1; Sodium 144   Lipid Panel    Component Value Date/Time   CHOL  09/22/2009 0330    134        ATP III CLASSIFICATION:  <200     mg/dL   Desirable  200-239  mg/dL   Borderline High  >=240    mg/dL   High  TRIG 125 09/22/2009 0330   HDL 47 09/22/2009 0330   CHOLHDL 2.9 09/22/2009 0330   VLDL 25 09/22/2009 0330   LDLCALC  09/22/2009 0330    62        Total Cholesterol/HDL:CHD Risk Coronary Heart Disease Risk Table                     Men   Women  1/2 Average Risk   3.4   3.3  Average Risk       5.0   4.4  2 X Average Risk   9.6   7.1  3 X Average Risk  23.4   11.0        Use the calculated Patient Ratio above and the CHD Risk Table to determine the patient's CHD Risk.        ATP III CLASSIFICATION (LDL):  <100     mg/dL   Optimal  100-129  mg/dL   Near or Above                    Optimal  130-159  mg/dL   Borderline  160-189  mg/dL   High  >190     mg/dL   Very High    Additional studies/ records that were reviewed today include:  2D echo 2/11/2019Study Conclusions   - Left ventricle: The cavity size was moderately dilated. There was   severe concentric hypertrophy. Systolic function was normal. The   estimated ejection fraction was in the range of 15% to 20%. Wall   motion was normal; there were no regional wall motion   abnormalities. Features are consistent with a pseudonormal left   ventricular filling pattern, with concomitant abnormal relaxation   and increased filling pressure (grade 2 diastolic dysfunction).   Doppler parameters are consistent with elevated ventricular   end-diastolic filling pressure. - Aortic valve: There was trivial regurgitation. - Mitral valve: There was mild regurgitation. - Left atrium: The atrium was moderately dilated. - Right ventricle: The cavity size was moderately dilated. Wall   thickness was normal. Systolic function was  moderately reduced. - Right atrium: The atrium was normal in size. - Tricuspid valve: There was mild regurgitation. - Pulmonary arteries: Systolic pressure was within the normal   range. - Inferior vena cava: The vessel was normal in size. The   respirophasic diameter changes were in the normal range (= 50%),   consistent with normal central venous pressure. - Pericardium, extracardiac: There was no pericardial effusion.     ASSESSMENT:    1. Nonischemic cardiomyopathy ([place])   2. Chronic systolic heart failure ([place])   3. Dilated aortic root ([place])   4. Essential hypertension   5. Morbid obesity ([place])   6. Mixed hyperlipidemia   7. ICD (implantable cardioverter-defibrillator) in place   8. Diabetes mellitus due to underlying condition with hyperosmolarity without coma, unspecified whether long term insulin use (HCC)      PLAN:  In order of problems listed above:  Ischemic cardiomyopathy ejection fraction 15 to 20% on most recent echo 02/2017 on Entresto and Aldactone.  Check be met today.  Follow-up with Dr. Radford Pax in 6 months.  Chronic systolic CHF compensated although eats out 3 days a week fast food.  Instructed on the importance of 2 g sodium diet.  Dilated aortic root 40 mm in the past but most recent echo did not show dilated aortic root  Essential hypertension blood pressure controlled  Morbid obesity patient is 150  pounds overweight.  Importance of weight loss discussed with patient.  Recommend exercise program and weight loss program.  Mixed hyperlipidemia continue Zocor.  LDL 75/2019  ICD followed by Dr. Lovena Le 05/2017 at which time he was pacing the ventricle 99% of the time.  Battery longevity was 1 year.  He is following him closely.  See his note for details.  Diabetes mellitus hemoglobin A1c 13.4-05/2017 needs diet and tighter control. Medication Adjustments/Labs and Tests Ordered: Current medicines are reviewed at length with the patient today.  Concerns  regarding medicines are outlined above.  Medication changes, Labs and Tests ordered today are listed in the Patient Instructions below. Patient Instructions  Medication Instructions:  Your physician recommends that you continue on your current medications as directed. Please refer to the Current Medication list given to you today.   Labwork: TODAY: BMET  Testing/Procedures: None ordered  Follow-Up: Your physician recommends that you schedule a follow-up appointment in: November with Dr. Lovena Le  Your physician wants you to follow-up in: 6 months with Dr. Radford Pax. You will receive a reminder letter in the mail two months in advance. If you don't receive a letter, please call our office to schedule the follow-up appointment.    Any Other Special Instructions Will Be Listed Below (If Applicable).     If you need a refill on your cardiac medications before your next appointment, please call your pharmacy.  r    Signed, Ermalinda Barrios, PA-C  09/13/2017 8:30 AM    [facility] [place], [place], [place]  [address] Phone: [phone]; Fax: [phone]

## 2017-09-13 ENCOUNTER — Encounter: Payer: Self-pay | Admitting: Physician Assistant

## 2017-09-13 ENCOUNTER — Ambulatory Visit: Payer: Medicare Other | Admitting: Physician Assistant

## 2017-09-13 VITALS — BP 112/80 | HR 80 | Ht 70.0 in | Wt 377.0 lb

## 2017-09-13 DIAGNOSIS — I77810 Thoracic aortic ectasia: Secondary | ICD-10-CM

## 2017-09-13 DIAGNOSIS — I5022 Chronic systolic (congestive) heart failure: Secondary | ICD-10-CM

## 2017-09-13 DIAGNOSIS — E0800 Diabetes mellitus due to underlying condition with hyperosmolarity without nonketotic hyperglycemic-hyperosmolar coma (NKHHC): Secondary | ICD-10-CM

## 2017-09-13 DIAGNOSIS — I428 Other cardiomyopathies: Secondary | ICD-10-CM | POA: Diagnosis not present

## 2017-09-13 DIAGNOSIS — I10 Essential (primary) hypertension: Secondary | ICD-10-CM

## 2017-09-13 DIAGNOSIS — Z95810 Presence of automatic (implantable) cardiac defibrillator: Secondary | ICD-10-CM

## 2017-09-13 DIAGNOSIS — E782 Mixed hyperlipidemia: Secondary | ICD-10-CM

## 2017-09-13 LAB — BASIC METABOLIC PANEL
BUN / CREAT RATIO: 11 (ref 10–24)
BUN: 10 mg/dL (ref 8–27)
CO2: 21 mmol/L (ref 20–29)
CREATININE: 0.95 mg/dL (ref 0.76–1.27)
Calcium: 9.9 mg/dL (ref 8.6–10.2)
Chloride: 104 mmol/L (ref 96–106)
GFR, EST AFRICAN AMERICAN: 99 mL/min/{1.73_m2} (ref >59)
GFR, EST NON AFRICAN AMERICAN: 85 mL/min/{1.73_m2} (ref >59)
Glucose: 166 mg/dL — ABNORMAL HIGH (ref 65–99)
POTASSIUM: 4.2 mmol/L (ref 3.5–5.2)
SODIUM: 138 mmol/L (ref 134–144)

## 2017-09-13 NOTE — Patient Instructions (Signed)
Medication Instructions:  Your physician recommends that you continue on your current medications as directed. Please refer to the Current Medication list given to you today.   Labwork: TODAY: BMET  Testing/Procedures: None ordered  Follow-Up: Your physician recommends that you schedule a follow-up appointment in: November with Dr. Ladona Ridgel  Your physician wants you to follow-up in: 6 months with Dr. [name]. You will receive a reminder letter in the mail two months in advance. If you don't receive a letter, please call our office to schedule the follow-up appointment.    Any Other Special Instructions Will Be Listed Below (If Applicable).     If you need a refill on your cardiac medications before your next appointment, please call your pharmacy.  r

## 2017-09-21 LAB — CUP PACEART REMOTE DEVICE CHECK
Battery Remaining Longevity: 11 mo
Battery Remaining Percentage: 11 %
Brady Statistic AP VS Percent: 1 %
Brady Statistic AS VP Percent: 96 %
Brady Statistic AS VS Percent: 1 %
Brady Statistic RV Percent Paced: 99 %
HighPow Impedance: 41 Ohm
Implantable Lead Implant Date: 19961213
Implantable Lead Location: 753859
Implantable Lead Model: 5524
Implantable Lead Model: 7121
Lead Channel Impedance Value: 460 Ohm
Lead Channel Pacing Threshold Amplitude: 0.75 V
Lead Channel Pacing Threshold Amplitude: 0.875 V
Lead Channel Pacing Threshold Pulse Width: 0.5 ms
Lead Channel Pacing Threshold Pulse Width: 0.5 ms
Lead Channel Setting Pacing Amplitude: 2 V
MDC IDC LEAD IMPLANT DT: 20111202
MDC IDC LEAD LOCATION: 753860
MDC IDC MSMT BATTERY VOLTAGE: 2.69 V
MDC IDC MSMT LEADCHNL RA SENSING INTR AMPL: 2.2 mV
MDC IDC MSMT LEADCHNL RV IMPEDANCE VALUE: 360 Ohm
MDC IDC MSMT LEADCHNL RV SENSING INTR AMPL: 11.7 mV
MDC IDC PG IMPLANT DT: 20111202
MDC IDC PG SERIAL: 629155
MDC IDC SESS DTM: 20190801160929
MDC IDC SET LEADCHNL RA PACING AMPLITUDE: 2 V
MDC IDC SET LEADCHNL RV PACING PULSEWIDTH: 0.5 ms
MDC IDC SET LEADCHNL RV SENSING SENSITIVITY: 2 mV
MDC IDC STAT BRADY AP VP PERCENT: 3.6 %
MDC IDC STAT BRADY RA PERCENT PACED: 3.3 %

## 2017-11-09 ENCOUNTER — Ambulatory Visit (INDEPENDENT_AMBULATORY_CARE_PROVIDER_SITE_OTHER): Payer: Medicare Other | Admitting: *Deleted

## 2017-11-09 DIAGNOSIS — I442 Atrioventricular block, complete: Secondary | ICD-10-CM | POA: Diagnosis not present

## 2017-11-09 DIAGNOSIS — I428 Other cardiomyopathies: Secondary | ICD-10-CM

## 2017-11-09 NOTE — Progress Notes (Signed)
Remote ICD transmission.   

## 2017-11-17 ENCOUNTER — Encounter: Payer: Self-pay | Admitting: Cardiology

## 2017-11-17 ENCOUNTER — Encounter: Payer: Self-pay | Admitting: Internal Medicine

## 2017-11-17 ENCOUNTER — Ambulatory Visit (INDEPENDENT_AMBULATORY_CARE_PROVIDER_SITE_OTHER): Payer: Medicare Other | Admitting: Internal Medicine

## 2017-11-17 VITALS — BP 124/76 | HR 95 | Ht 70.0 in | Wt 378.0 lb

## 2017-11-17 DIAGNOSIS — I5022 Chronic systolic (congestive) heart failure: Secondary | ICD-10-CM

## 2017-11-17 DIAGNOSIS — Z95810 Presence of automatic (implantable) cardiac defibrillator: Secondary | ICD-10-CM

## 2017-11-17 DIAGNOSIS — I10 Essential (primary) hypertension: Secondary | ICD-10-CM

## 2017-11-17 DIAGNOSIS — I428 Other cardiomyopathies: Secondary | ICD-10-CM | POA: Diagnosis not present

## 2017-11-17 DIAGNOSIS — I442 Atrioventricular block, complete: Secondary | ICD-10-CM | POA: Diagnosis not present

## 2017-11-17 NOTE — Progress Notes (Signed)
HPI [name] returns today for followup. He is a very pleasant89 year old man with morbid obesity, a nonischemic cardiomyopathy, complete heart block, status post ICD implantation. When his initial ICD was placed, the patient was unable to receive a left ventricular lead secondary to severe scar tissue and a subtotally occluded subclavian vein. In the interim, he has been stable. He denies chest pain but has had some worsening dyspnea. He has minimal peripheral edema. He denies syncope. He is frustrated by his inability to lose and keep off weight.He admits to dietary indiscretion. Allergies  Allergen Reactions  . Penicillins Other (See Comments)    dizziness     Current Outpatient Medications  Medication Sig Dispense Refill  . albuterol (PROVENTIL HFA;VENTOLIN HFA) 108 (90 BASE) MCG/ACT inhaler Inhale 2 puffs into the lungs every 6 (six) hours as needed. For shortness of breath    . ARNUITY ELLIPTA 100 MCG/ACT AEPB USE 1 PUFF BY MOUTH INTO THE LUNGS DAILY 30 each 7  . carvedilol (COREG) 25 MG tablet Take 1 tablet (25 mg total) by mouth 2 (two) times daily. 180 tablet 3  . furosemide (LASIX) 40 MG tablet TAKE 1 TABLET BY MOUTH EVERY DAY 90 tablet 3  . insulin glargine (LANTUS) 100 UNIT/ML injection Inject 40 Units into the skin 2 (two) times daily.     . Liraglutide (VICTOZA) 18 MG/3ML SOPN Inject 1.2 mg into the skin daily.    . metoprolol tartrate (LOPRESSOR) 25 MG tablet Take 25 mg by mouth 2 (two) times daily.    . rivaroxaban (XARELTO) 20 MG TABS tablet Take 1 tablet (20 mg total) by mouth daily with supper. 30 tablet 11  . sacubitril-valsartan (ENTRESTO) 97-103 MG Take 1 tablet by mouth 2 (two) times daily. 180 tablet 3  . simvastatin (ZOCOR) 40 MG tablet Take 40 mg by mouth every evening.    Marland Kitchen spironolactone (ALDACTONE) 25 MG tablet TAKE 0.5 TABLETS (12.5 MG TOTAL) BY MOUTH DAILY.  11   No current facility-administered medications for this visit.      Past Medical  History:  Diagnosis Date  . Asthma   . Automatic implantable cardioverter-defibrillator in situ   . Back pain   . Chronic systolic CHF (congestive heart failure), NYHA class 3 (HCC)   . Complete heart block [facility])    s/p PPM 1997 with upgrade to AICD 2011  . Depression   . Dilated aortic root (HCC)    79mm ascending aortic root  . DM (diabetes mellitus) (HCC)   . Glaucoma   . HTN (hypertension)   . Hyperlipidemia   . Nonischemic cardiomyopathy (HCC)    EF of 15-20% by echo 2015 and 20% by nuclear stress test 2017  . Obesity   . Sarcoid   . Sleep apnea 06-28-12   no cpap used-unable to tolerate  . SVT (supraventricular tachycardia) (HCC)    s/p ablation    ROS:   All systems reviewed and negative except as noted in the HPI.   Past Surgical History:  Procedure Laterality Date  . COLONOSCOPY WITH PROPOFOL N/A 07/17/2012   Procedure: COLONOSCOPY WITH PROPOFOL;  Surgeon: Charolett Bumpers, MD;  Location: [facility];  Service: Endoscopy;  Laterality: N/A;  . HEMORRHOID SURGERY     early 20's  . PACEMAKER INSERTION    . TONSILLECTOMY       Family History  Problem Relation Age of Onset  . Diabetes Mother   . Alzheimer's disease Mother   . CVA  Father   . Heart failure Father   . Epilepsy Sister   . Diabetes Brother   . Diabetes Sister      Social History   Socioeconomic History  . Marital status: Married    Spouse name: Not on file  . Number of children: Not on file  . Years of education: Not on file  . Highest education level: Not on file  Occupational History  . Not on file  Social Needs  . Financial resource strain: Not on file  . Food insecurity:    Worry: Not on file    Inability: Not on file  . Transportation needs:    Medical: Not on file    Non-medical: Not on file  Tobacco Use  . Smoking status: Never Smoker  . Smokeless tobacco: Never Used  Substance and Sexual Activity  . Alcohol use: Yes    Comment: occasionally  . Drug use: No  . Sexual  activity: Yes  Lifestyle  . Physical activity:    Days per week: Not on file    Minutes per session: Not on file  . Stress: Not on file  Relationships  . Social connections:    Talks on phone: Not on file    Gets together: Not on file    Attends religious service: Not on file    Active member of club or organization: Not on file    Attends meetings of clubs or organizations: Not on file    Relationship status: Not on file  . Intimate partner violence:    Fear of current or ex partner: Not on file    Emotionally abused: Not on file    Physically abused: Not on file    Forced sexual activity: Not on file  Other Topics Concern  . Not on file  Social History Narrative  . Not on file     BP 124/76   Pulse 95   Ht 5\' 10"  (1.778 m)   Wt (!) 378 lb (171.5 kg)   SpO2 95%   BMI 54.24 kg/m   Physical Exam:  Well appearing NAD HEENT: Unremarkable Neck:  No JVD, no thyromegally Lymphatics:  No adenopathy Back:  No CVA tenderness Lungs:  Clear with no wheezes HEART:  Regular rate rhythm, no murmurs, no rubs, no clicks Abd:  soft, positive bowel sounds, no organomegally, no rebound, no guarding Ext:  2 plus pulses, no edema, no cyanosis, no clubbing Skin:  No rashes no nodules Neuro:  CN II through XII intact, motor grossly intact  EKG - none  DEVICE  Normal device function.  See PaceArt for details.   Assess/Plan: 1. CHB - he is pacing 100% of the time but his heart failure symptoms are class 2. 2. Obesity - I have encouraged the patient to lose weight. 3. ICD - he is about 7 months from ERI. His device is working normally. 4. Chronic systolic heart failure - his symptoms are class 2. He is encouraged to reduce his sodium intake, continue his current meds and lose weight.  Leonia Reeves.D.

## 2017-11-17 NOTE — Patient Instructions (Addendum)
Medication Instructions:  Your physician recommends that you continue on your current medications as directed. Please refer to the Current Medication list given to you today.  Labwork: None ordered.  Testing/Procedures: None ordered.  Follow-Up: Your physician wants you to follow-up in: 7 months with Dr. Ladona Ridgel (June 2020).   You will receive a reminder letter in the mail two months in advance. If you don't receive a letter, please call our office to schedule the follow-up appointment.  Remote monitoring is used to monitor your ICD from home. This monitoring reduces the number of office visits required to check your device to one time per year. It allows [place] to keep an eye on the functioning of your device to ensure it is working properly. You are scheduled for a device check from home on 02/08/2018. You may send your transmission at any time that day. If you have a wireless device, the transmission will be sent automatically. After your physician reviews your transmission, you will receive a postcard with your next transmission date.  Any Other Special Instructions Will Be Listed Below (If Applicable).  If you need a refill on your cardiac medications before your next appointment, please call your pharmacy.

## 2017-11-22 LAB — CUP PACEART INCLINIC DEVICE CHECK
Battery Remaining Longevity: 7 mo
Brady Statistic RV Percent Paced: 99.57 %
HighPow Impedance: 44.5809
Implantable Lead Implant Date: 19961213
Implantable Lead Implant Date: 20111202
Implantable Lead Location: 753860
Implantable Lead Model: 5524
Implantable Lead Model: 7121
Implantable Pulse Generator Implant Date: 20111202
Lead Channel Impedance Value: 3187.5 Ohm
Lead Channel Impedance Value: 475 Ohm
Lead Channel Pacing Threshold Amplitude: 0.75 V
Lead Channel Pacing Threshold Amplitude: 0.875 V
Lead Channel Pacing Threshold Pulse Width: 0.5 ms
Lead Channel Sensing Intrinsic Amplitude: 1.9 mV
Lead Channel Sensing Intrinsic Amplitude: 4.2 mV
Lead Channel Setting Pacing Amplitude: 2 V
Lead Channel Setting Pacing Amplitude: 2 V
MDC IDC LEAD LOCATION: 753859
MDC IDC MSMT LEADCHNL RA PACING THRESHOLD AMPLITUDE: 0.75 V
MDC IDC MSMT LEADCHNL RA PACING THRESHOLD PULSEWIDTH: 0.5 ms
MDC IDC MSMT LEADCHNL RA PACING THRESHOLD PULSEWIDTH: 0.5 ms
MDC IDC MSMT LEADCHNL RV IMPEDANCE VALUE: 387.5 Ohm
MDC IDC PG SERIAL: 629155
MDC IDC SESS DTM: 20191108153216
MDC IDC SET LEADCHNL RV PACING PULSEWIDTH: 0.5 ms
MDC IDC SET LEADCHNL RV SENSING SENSITIVITY: 2 mV
MDC IDC STAT BRADY RA PERCENT PACED: 3.5 %

## 2017-12-01 ENCOUNTER — Telehealth: Payer: Self-pay | Admitting: Cardiology

## 2017-12-01 ENCOUNTER — Other Ambulatory Visit: Payer: Self-pay | Admitting: *Deleted

## 2017-12-01 MED ORDER — SPIRONOLACTONE 25 MG PO TABS: ORAL_TABLET | ORAL | 2 refills | Status: DC

## 2017-12-01 NOTE — Telephone Encounter (Signed)
°*  STAT* If patient is at the pharmacy, call can be transferred to refill team.   1. Which medications need to be refilled? (please list name of each medication and dose if known) Spironolactone  2. Which pharmacy/location (including street and city if local pharmacy) is medication to be sent to? [facility] [phone]  3. Do they need a 30 day or 90 day supply? 45 and refills

## 2018-01-09 LAB — CUP PACEART REMOTE DEVICE CHECK
Brady Statistic AP VP Percent: 3.9 %
Brady Statistic AS VP Percent: 96 %
Brady Statistic AS VS Percent: 1 %
Date Time Interrogation Session: 20191031112007
HIGH POWER IMPEDANCE MEASURED VALUE: 44 Ohm
Implantable Lead Implant Date: 19961213
Implantable Lead Location: 753859
Implantable Lead Location: 753860
Lead Channel Impedance Value: 480 Ohm
Lead Channel Pacing Threshold Amplitude: 0.75 V
Lead Channel Pacing Threshold Amplitude: 0.875 V
Lead Channel Sensing Intrinsic Amplitude: 2.2 mV
Lead Channel Setting Pacing Pulse Width: 0.5 ms
MDC IDC LEAD IMPLANT DT: 20111202
MDC IDC MSMT BATTERY REMAINING LONGEVITY: 7 mo
MDC IDC MSMT BATTERY REMAINING PERCENTAGE: 7 %
MDC IDC MSMT BATTERY VOLTAGE: 2.65 V
MDC IDC MSMT LEADCHNL RA PACING THRESHOLD PULSEWIDTH: 0.5 ms
MDC IDC MSMT LEADCHNL RV IMPEDANCE VALUE: 390 Ohm
MDC IDC MSMT LEADCHNL RV PACING THRESHOLD PULSEWIDTH: 0.5 ms
MDC IDC MSMT LEADCHNL RV SENSING INTR AMPL: 9.7 mV
MDC IDC PG IMPLANT DT: 20111202
MDC IDC SET LEADCHNL RA PACING AMPLITUDE: 2 V
MDC IDC SET LEADCHNL RV PACING AMPLITUDE: 2 V
MDC IDC SET LEADCHNL RV SENSING SENSITIVITY: 2 mV
MDC IDC STAT BRADY AP VS PERCENT: 1 %
MDC IDC STAT BRADY RA PERCENT PACED: 3.5 %
MDC IDC STAT BRADY RV PERCENT PACED: 99 %
Pulse Gen Serial Number: 629155

## 2018-02-08 ENCOUNTER — Ambulatory Visit (INDEPENDENT_AMBULATORY_CARE_PROVIDER_SITE_OTHER): Payer: Medicare Other

## 2018-02-08 DIAGNOSIS — I428 Other cardiomyopathies: Secondary | ICD-10-CM

## 2018-02-09 LAB — CUP PACEART REMOTE DEVICE CHECK
Battery Remaining Longevity: 5 mo
Battery Remaining Percentage: 4 %
Brady Statistic AP VS Percent: 1 %
Brady Statistic AS VP Percent: 96 %
Brady Statistic AS VS Percent: 1 %
Brady Statistic RA Percent Paced: 3.5 %
HighPow Impedance: 40 Ohm
Implantable Lead Implant Date: 19961213
Implantable Lead Implant Date: 20111202
Implantable Lead Location: 753859
Implantable Lead Model: 5524
Implantable Pulse Generator Implant Date: 20111202
Lead Channel Impedance Value: 380 Ohm
Lead Channel Pacing Threshold Amplitude: 0.75 V
Lead Channel Pacing Threshold Pulse Width: 0.5 ms
Lead Channel Pacing Threshold Pulse Width: 0.5 ms
Lead Channel Sensing Intrinsic Amplitude: 2.6 mV
MDC IDC LEAD LOCATION: 753860
MDC IDC MSMT BATTERY VOLTAGE: 2.62 V
MDC IDC MSMT LEADCHNL RA IMPEDANCE VALUE: 460 Ohm
MDC IDC MSMT LEADCHNL RV PACING THRESHOLD AMPLITUDE: 0.75 V
MDC IDC MSMT LEADCHNL RV SENSING INTR AMPL: 6.1 mV
MDC IDC PG SERIAL: 629155
MDC IDC SESS DTM: 20200130115122
MDC IDC SET LEADCHNL RA PACING AMPLITUDE: 2 V
MDC IDC SET LEADCHNL RV PACING AMPLITUDE: 2 V
MDC IDC SET LEADCHNL RV PACING PULSEWIDTH: 0.5 ms
MDC IDC SET LEADCHNL RV SENSING SENSITIVITY: 2 mV
MDC IDC STAT BRADY AP VP PERCENT: 3.8 %
MDC IDC STAT BRADY RV PERCENT PACED: 99 %

## 2018-02-11 ENCOUNTER — Other Ambulatory Visit: Payer: Self-pay | Admitting: Cardiology

## 2018-02-16 NOTE — Progress Notes (Signed)
Remote ICD transmission.   

## 2018-02-19 ENCOUNTER — Encounter: Payer: Self-pay | Admitting: Cardiology

## 2018-04-12 ENCOUNTER — Telehealth: Payer: Self-pay | Admitting: Internal Medicine

## 2018-04-12 NOTE — Telephone Encounter (Signed)
Pt states his ICD was vtibrating. I had the pt send a manual transmission. I told the patient the nurse will look at it and give him a call back if not today, it will be tomorrow. He overall feel good. He do not think he got shock just feel vibrations.

## 2018-04-12 NOTE — Telephone Encounter (Signed)
°  1. Has your device fired? yes  2. Is you device beeping? no  3. Are you experiencing draining or swelling at device site? no  4. Are you calling to see if we received your device transmission? no  5. Have you passed out? no    Please route to Device Clinic Pool

## 2018-04-12 NOTE — Telephone Encounter (Signed)
Spoke with patient. Advised that ICD at [facility] as of 04/12/18. Explained that his device has 3 months of full function to allow for time for gen change. Vibratory alert will eventually turn off automatically. Will route to [place], RN, to schedule f/u with Dr. Ladona Ridgel to discuss gen change. Pt verbalizes understanding and thanked me for my call.   Patient does have a [employer] advisory device, though this transmission indicates expected ERI, not premature battery depletion. Patient is pacer-dependent per notes.

## 2018-04-18 NOTE — Telephone Encounter (Signed)
printed

## 2018-05-01 ENCOUNTER — Telehealth: Payer: Self-pay | Admitting: Cardiology

## 2018-05-01 NOTE — Telephone Encounter (Signed)
VIDEO/DOXY.ME/SMARTPHONE/VERBAL CONSENT/04/30/08/VITALS  YOUR CARDIOLOGY TEAM HAS ARRANGED FOR AN E-VISIT FOR YOUR APPOINTMENT - PLEASE REVIEW IMPORTANT INFORMATION BELOW SEVERAL DAYS PRIOR TO YOUR APPOINTMENT  Due to the recent COVID-19 pandemic, we are transitioning in-person office visits to tele-medicine visits in an effort to decrease unnecessary exposure to our patients, their families, and staff. These visits are billed to your insurance just like a normal visit is. We also encourage you to sign up for MyChart if you have not already done so. You will need a smartphone if possible. For patients that do not have this, we can still complete the visit using a regular telephone but do prefer a smartphone to enable video when possible. You may have a family member that lives with you that can help. If possible, we also ask that you have a blood pressure cuff and scale at home to measure your blood pressure, heart rate and weight prior to your scheduled appointment. Patients with clinical needs that need an in-person evaluation and testing will still be able to come to the office if absolutely necessary. If you have any questions, feel free to call our office.     YOUR PROVIDER WILL BE USING THE FOLLOWING PLATFORM TO COMPLETE YOUR VISIT:   Doxy.Me    IF USING MYCHART - How to Download the MyChart App to Your SmartPhone   - If Apple, go to [employer] and type in MyChart in the search bar and download the app. If Android, ask patient to go to [employer] and type in [place] in the search bar and download the app. The app is free but as with any other app downloads, your phone may require you to verify saved payment information or Apple/Android password.  - You will need to then log into the app with your MyChart username and password, and select [facility] as your healthcare provider to link the account.  - When it is time for your visit, go to the MyChart app, find appointments, and click  Begin Video Visit. Be sure to Select Allow for your device to access the Microphone and Camera for your visit. You will then be connected, and your provider will be with you shortly.  **If you have any issues connecting or need assistance, please contact MyChart service desk [phone])**  **If using a computer, in order to ensure the best quality for your visit, you will need to use either of the following Internet Browsers: Agricultural consultant or Microsoft Edge**   IF USING DOXIMITY or DOXY.ME - The staff will give you instructions on receiving your link to join the meeting the day of your visit.      2-3 DAYS BEFORE YOUR APPOINTMENT  You will receive a telephone call from one of our [facility] team members - your caller ID may say "Unknown caller." If this is a video visit, we will walk you through how to get the video launched on your phone. We will remind you check your blood pressure, heart rate and weight prior to your scheduled appointment. If you have an Apple Watch or Kardia, please upload any pertinent ECG strips the day before or morning of your appointment to MyChart. Our staff will also make sure you have reviewed the consent and agree to move forward with your scheduled tele-health visit.     THE DAY OF YOUR APPOINTMENT  Approximately 15 minutes prior to your scheduled appointment, you will receive a telephone call from one of HeartCare team - your caller  ID may say "Unknown caller."  Our staff will confirm medications, vital signs for the day and any symptoms you may be experiencing. Please have this information available prior to the time of visit start. It may also be helpful for you to have a pad of paper and pen handy for any instructions given during your visit. They will also walk you through joining the smartphone meeting if this is a video visit.    CONSENT FOR TELE-HEALTH VISIT - PLEASE REVIEW  I hereby voluntarily request, consent and authorize [facility] and its employed or contracted physicians, physician assistants, nurse practitioners or other licensed health care professionals (the Practitioner), to provide me with telemedicine health care services (the Services") as deemed necessary by the treating Practitioner. I acknowledge and consent to receive the Services by the Practitioner via telemedicine. I understand that the telemedicine visit will involve communicating with the Practitioner through live audiovisual communication technology and the disclosure of certain medical information by electronic transmission. I acknowledge that I have been given the opportunity to request an in-person assessment or other available alternative prior to the telemedicine visit and am voluntarily participating in the telemedicine visit.  I understand that I have the right to withhold or withdraw my consent to the use of telemedicine in the course of my care at any time, without affecting my right to future care or treatment, and that the Practitioner or I may terminate the telemedicine visit at any time. I understand that I have the right to inspect all information obtained and/or recorded in the course of the telemedicine visit and may receive copies of available information for a reasonable fee.  I understand that some of the potential risks of receiving the Services via telemedicine include:   Delay or interruption in medical evaluation due to technological equipment failure or disruption;  Information transmitted may not be sufficient (e.g. poor resolution of images) to allow for appropriate medical decision making by the Practitioner; and/or   In rare instances, security protocols could fail, causing a breach of personal health information.  Furthermore, I acknowledge that it is my responsibility to provide information about my medical history, conditions and care that is complete and accurate to the best of my ability. I acknowledge that Practitioner's  advice, recommendations, and/or decision may be based on factors not within their control, such as incomplete or inaccurate data provided by me or distortions of diagnostic images or specimens that may result from electronic transmissions. I understand that the practice of medicine is not an exact science and that Practitioner makes no warranties or guarantees regarding treatment outcomes. I acknowledge that I will receive a copy of this consent concurrently upon execution via email to the email address I last provided but may also request a printed copy by calling the office of [facility].    I understand that my insurance will be billed for this visit.   I have read or had this consent read to me.  I understand the contents of this consent, which adequately explains the benefits and risks of the Services being provided via telemedicine.   I have been provided ample opportunity to ask questions regarding this consent and the Services and have had my questions answered to my satisfaction.  I give my informed consent for the services to be provided through the use of telemedicine in my medical care  By participating in this telemedicine visit I agree to the above.

## 2018-05-04 ENCOUNTER — Other Ambulatory Visit: Payer: Self-pay

## 2018-05-04 ENCOUNTER — Encounter: Payer: Self-pay | Admitting: Cardiology

## 2018-05-04 ENCOUNTER — Telehealth (INDEPENDENT_AMBULATORY_CARE_PROVIDER_SITE_OTHER): Payer: Medicare Other | Admitting: Cardiology

## 2018-05-04 VITALS — BP 108/79 | Ht 71.0 in | Wt 364.3 lb

## 2018-05-04 DIAGNOSIS — I442 Atrioventricular block, complete: Secondary | ICD-10-CM | POA: Diagnosis not present

## 2018-05-04 DIAGNOSIS — I428 Other cardiomyopathies: Secondary | ICD-10-CM

## 2018-05-04 DIAGNOSIS — I10 Essential (primary) hypertension: Secondary | ICD-10-CM | POA: Diagnosis not present

## 2018-05-04 DIAGNOSIS — I480 Paroxysmal atrial fibrillation: Secondary | ICD-10-CM

## 2018-05-04 DIAGNOSIS — I5022 Chronic systolic (congestive) heart failure: Secondary | ICD-10-CM

## 2018-05-04 DIAGNOSIS — E7800 Pure hypercholesterolemia, unspecified: Secondary | ICD-10-CM

## 2018-05-04 DIAGNOSIS — Z7189 Other specified counseling: Secondary | ICD-10-CM

## 2018-05-04 DIAGNOSIS — I471 Supraventricular tachycardia: Secondary | ICD-10-CM

## 2018-05-04 DIAGNOSIS — I77810 Thoracic aortic ectasia: Secondary | ICD-10-CM

## 2018-05-04 NOTE — Progress Notes (Signed)
Virtual Visit via Video Note   This visit type was conducted due to national recommendations for restrictions regarding the COVID-19 Pandemic (e.g. social distancing) in an effort to limit this patient's exposure and mitigate transmission in our community.  Due to his co-morbid illnesses, this patient is at least at moderate risk for complications without adequate follow up.  This format is felt to be most appropriate for this patient at this time.  All issues noted in this document were discussed and addressed.  A limited physical exam was performed with this format.  Please refer to the patient's chart for his consent to telehealth for [facility].   Evaluation Performed:  Follow-up visit  This visit type was conducted due to national recommendations for restrictions regarding the COVID-19 Pandemic (e.g. social distancing).  This format is felt to be most appropriate for this patient at this time.  All issues noted in this document were discussed and addressed.  No physical exam was performed (except for noted visual exam findings with Video Visits).  Please refer to the patient's chart (MyChart message for video visits and phone note for telephone visits) for the patient's consent to telehealth for [facility].  Date:  05/07/2018   ID:  [name], DOB 1956-01-07, MRN [id number]  Patient Location:  Home  Provider location:   [facility]  PCP:  Georgann Housekeeper, MD  Cardiologist:  [name], MD  Electrophysiologist:  None   Chief Complaint:  DCM, sarcoid, CHF, HTN, CHB s/p PPM and SVT  History of Present Illness:    [name] is a 63 y.o. male who presents via audio/video conferencing for a telehealth visit today.    [name] is a 63 y.o. 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  He is here today for followup and is  doing well.  Hedenies any chest pain or pressure, SOB, DOE, PND, orthopnea, LE edema, dizziness, palpitations or syncope. He is compliant with his meds and is tolerating meds with no SE.    The patient does not have symptoms concerning for COVID-19 infection (fever, chills, cough, or new shortness of breath).    Prior CV studies:   The following studies were reviewed today:  2D echo 02/2017  Past Medical History:  Diagnosis Date   Asthma    Automatic implantable cardioverter-defibrillator in situ    Back pain    Chronic systolic CHF (congestive heart failure), NYHA class 3 (HCC)    Complete heart block (HCC)    s/p PPM 1997 with upgrade to AICD 2011   Depression    Dilated aortic root (HCC)    56mm ascending aortic root   DM (diabetes mellitus) (HCC)    Glaucoma    HTN (hypertension)    Hyperlipidemia    Nonischemic cardiomyopathy (HCC)    EF of 15-20% by echo 2015 and 20% by nuclear stress test 2017   Obesity    PAF (paroxysmal atrial fibrillation) (HCC)    13 minutes of PAF documented on ICD check 2018 - anticoagulated with Eliquis   Sarcoid    Sleep apnea 06-28-12   no cpap used-unable to tolerate   SVT (supraventricular tachycardia) (HCC)    s/p ablation   Past Surgical History:  Procedure Laterality Date   COLONOSCOPY WITH PROPOFOL N/A 07/17/2012   Procedure: COLONOSCOPY WITH PROPOFOL;  Surgeon: Daphine Deutscher  Adin Hector, MD;  Location: [facility];  Service: Endoscopy;  Laterality: N/A;   HEMORRHOID SURGERY     early 20's   PACEMAKER INSERTION     TONSILLECTOMY       Current Meds  Medication Sig   albuterol (PROVENTIL HFA;VENTOLIN HFA) 108 (90 BASE) MCG/ACT inhaler Inhale 2 puffs into the lungs every 6 (six) hours as needed. For shortness of breath   ARNUITY ELLIPTA 100 MCG/ACT AEPB USE 1 PUFF BY MOUTH INTO THE LUNGS DAILY   ENTRESTO 97-103 MG TAKE 1 TABLET BY MOUTH TWICE A DAY   furosemide (LASIX) 40 MG tablet TAKE 1 TABLET BY MOUTH EVERY DAY    metoprolol tartrate (LOPRESSOR) 25 MG tablet Take 25 mg by mouth 2 (two) times daily.   OZEMPIC, 0.25 OR 0.5 MG/DOSE, 2 MG/1.5ML SOPN once a week.    rivaroxaban (XARELTO) 20 MG TABS tablet Take 1 tablet (20 mg total) by mouth daily with supper.   simvastatin (ZOCOR) 40 MG tablet Take 40 mg by mouth every evening.   spironolactone (ALDACTONE) 25 MG tablet TAKE 0.5 TABLETS (12.5 MG TOTAL) BY MOUTH DAILY.   SYMBICORT 80-4.5 MCG/ACT inhaler Inhale 2 puffs into the lungs 2 (two) times a day.   TRESIBA FLEXTOUCH 200 UNIT/ML SOPN Inject 70 Units into the skin daily.    [DISCONTINUED] carvedilol (COREG) 25 MG tablet Take 1 tablet (25 mg total) by mouth 2 (two) times daily.     Allergies:   Penicillins   Social History   Tobacco Use   Smoking status: Never Smoker   Smokeless tobacco: Never Used  Substance Use Topics   Alcohol use: Yes    Comment: occasionally   Drug use: No     Family Hx: The patient's family history includes Alzheimer's disease in his mother; CVA in his father; Diabetes in his brother, mother, and sister; Epilepsy in his sister; Heart failure in his father.  ROS:   Please see the history of present illness.     All other systems reviewed and are negative.   Labs/Other Tests and Data Reviewed:    Recent Labs: 09/13/2017: BUN 10; Creatinine, Ser 0.95; Potassium 4.2; Sodium 138   Recent Lipid Panel Lab Results  Component Value Date/Time   CHOL  09/22/2009 03:30 AM    134        ATP III CLASSIFICATION:  <200     mg/dL   Desirable  161-096  mg/dL   Borderline High  >=045    mg/dL   High          TRIG 409 09/22/2009 03:30 AM   HDL 47 09/22/2009 03:30 AM   CHOLHDL 2.9 09/22/2009 03:30 AM   LDLCALC  09/22/2009 03:30 AM    62        Total Cholesterol/HDL:CHD Risk Coronary Heart Disease Risk Table                     Men   Women  1/2 Average Risk   3.4   3.3  Average Risk       5.0   4.4  2 X Average Risk   9.6   7.1  3 X Average Risk  23.4   11.0         Use the calculated Patient Ratio above and the CHD Risk Table to determine the patient's CHD Risk.        ATP III CLASSIFICATION (LDL):  <100     mg/dL   Optimal  100-129  mg/dL   Near or Above                    Optimal  130-159  mg/dL   Borderline  559-741  mg/dL   High  >638     mg/dL   Very High    Wt Readings from Last 3 Encounters:  05/04/18 (!) 364 lb 4.8 oz (165.2 kg)  11/17/17 (!) 378 lb (171.5 kg)  09/13/17 (!) 377 lb (171 kg)     Objective:    Vital Signs:  BP 108/79    Ht 5\' 11"  (1.803 m)    Wt (!) 364 lb 4.8 oz (165.2 kg)    BMI 50.81 kg/m    CONSTITUTIONAL:  Well nourished, well developed male in no acute distress.  EYES: anicteric MOUTH: oral mucosa is pink RESPIRATORY: Normal respiratory effort, symmetric expansion CARDIOVASCULAR: No peripheral edema SKIN: No rash, lesions or ulcers MUSCULOSKELETAL: no digital cyanosis NEURO: Cranial Nerves II-XII grossly intact, moves all extremities PSYCH: Intact judgement and insight.  A&O x 3, Mood/affect appropriate   ASSESSMENT & PLAN:    1.  Chronic systolic CHF  -he tells me that he has not been having any shortness of breath or lower extremity edema.  His weight has been stable.  His creatinine was 0.95 on 09/13/2017.  He will continue on Entresto 97-103 mg twice daily, Lasix 40 mg daily, Lopressor 25 mg twice daily and spiro 12.5 mg daily.  His blood pressure is soft and there is no room for up titration of spironolactone or Lopressor.  He appears compensated today and his weight is down 4-5lbs.  Check BMET at time of PYP scan.  2.  NIDCM -normal coronary arteries were noted at time of cath and work-up of his cardiomyopathy.  His last echo in February 2019 showed EF 15 to 20%.  He is status post dual-chamber AICD.  I have recommended getting a PYP scan to rule out TTYR  amyloidosis.    3.  HTN -his BP is well controlled on exam today.  He is following a low sodium diet.  He will continue on Lopressor 25 mg twice  daily, Entresto 97-23 mg twice daily and Spiro 12.5 mg daily.  4.  CHB  -he is status post dual-chamber AICD and is followed in device clinic.  5.  SVT -he is status post SVT ablation remotely.  He denies any recent palpitations.  Continue on beta-blocker therapy.  6.  PAF - he was noted on ICD check in 2018 to have PAF.  His CHADS2VASC score is 2 and will remain on Xarelto 20mg  daily.  He has not had any problems with excessive bleeding. He will continue on Lopressor 25mg  BID.  His last creatinine was 0.95 last fall.  7.  Dilated aortic root -2D echo 03/01/2017 showed Aortic root at 35 mm.    8.  COVID-19 Education:The signs and symptoms of COVID-19 were discussed with the patient and how to seek care for testing (follow up with PCP or arrange E-visit).  The importance of social distancing was discussed today.  Patient Risk:   After full review of this patient's clinical status, I feel that they are at least moderate risk at this time.  Time:   Today, I have spent 20 minutes directly with the patient on video discussing medical problems including CHF, DCM, HTN, CHB, dilated aortic root.  We also reviewed the symptoms of COVID 19 and the ways to protect against contracting  the virus with telehealth technology.  I spent an additional 10 minutes reviewing patient's chart including 2D echo, labs and prior notes.  Medication Adjustments/Labs and Tests Ordered: Current medicines are reviewed at length with the patient today.  Concerns regarding medicines are outlined above.  Tests Ordered: Orders Placed This Encounter  Procedures   Basic metabolic panel   MYOCARDIAL AMYLOID IMAGING PLANAR & SPECT   Medication Changes: No orders of the defined types were placed in this encounter.   Disposition:  Follow up in 6 month(s)  Signed, Armanda Magicraci Caylin Nass, MD  05/07/2018 3:28 PM    [facility]

## 2018-05-04 NOTE — Telephone Encounter (Signed)
Called to start video visit appointment with patient and obtained his verbal consent to continue for appointment 05/04/2018 at 2:20pm with Dr [name].

## 2018-05-04 NOTE — Patient Instructions (Signed)
Medication Instructions:  Your physician recommends that you continue on your current medications as directed. Please refer to the Current Medication list given to you today.  If you need a refill on your cardiac medications before your next appointment, please call your pharmacy.   Lab work: BMET, same day as Amyloidosis scan If you have labs (blood work) drawn today and your tests are completely normal, you will receive your results only by: Marland Kitchen MyChart Message (if you have MyChart) OR . A paper copy in the mail If you have any lab test that is abnormal or we need to change your treatment, we will call you to review the results.  Testing/Procedures: Schedule an amyloidosis scan  Follow-Up: At [facility], you and your health needs are our priority.  As part of our continuing mission to provide you with exceptional heart care, we have created designated Provider Care Teams.  These Care Teams include your primary Cardiologist (physician) and Advanced Practice Providers (APPs -  Physician Assistants and Nurse Practitioners) who all work together to provide you with the care you need, when you need it. You will need a follow up appointment in 6 months.  Please call our office 2 months in advance to schedule this appointment.  You may see [name], MD or one of the following Advanced Practice Providers on your designated Care Team:   [place], PA-C Ronie Spies, PA-C . Jacolyn Reedy, PA-C

## 2018-05-07 ENCOUNTER — Encounter: Payer: Self-pay | Admitting: Cardiology

## 2018-05-07 DIAGNOSIS — I480 Paroxysmal atrial fibrillation: Secondary | ICD-10-CM | POA: Insufficient documentation

## 2018-05-09 ENCOUNTER — Other Ambulatory Visit: Payer: Self-pay

## 2018-05-09 ENCOUNTER — Ambulatory Visit (HOSPITAL_COMMUNITY): Payer: Medicare Other | Attending: Cardiology

## 2018-05-09 DIAGNOSIS — I428 Other cardiomyopathies: Secondary | ICD-10-CM

## 2018-05-09 MED ORDER — TECHNETIUM TC 99M PYROPHOSPHATE: 21.1000 | Freq: Once | INTRAVENOUS | Status: AC

## 2018-05-10 ENCOUNTER — Ambulatory Visit (INDEPENDENT_AMBULATORY_CARE_PROVIDER_SITE_OTHER): Payer: Medicare Other | Admitting: *Deleted

## 2018-05-10 DIAGNOSIS — I428 Other cardiomyopathies: Secondary | ICD-10-CM | POA: Diagnosis not present

## 2018-05-10 DIAGNOSIS — I5022 Chronic systolic (congestive) heart failure: Secondary | ICD-10-CM

## 2018-05-10 LAB — CUP PACEART REMOTE DEVICE CHECK
Battery Remaining Longevity: 0 mo
Battery Remaining Percentage: 0 %
Brady Statistic RA Percent Paced: 3.4 %
Brady Statistic RV Percent Paced: 100 %
Date Time Interrogation Session: 20200501074008
HighPow Impedance: 39 Ohm
Implantable Lead Implant Date: 19961213
Implantable Lead Implant Date: 20111202
Implantable Lead Location: 753859
Implantable Lead Location: 753860
Implantable Lead Model: 5524
Implantable Lead Model: 7121
Implantable Pulse Generator Implant Date: 20111202
Lead Channel Impedance Value: 380 Ohm
Lead Channel Impedance Value: 480 Ohm
Lead Channel Pacing Threshold Amplitude: 0.875 V
Lead Channel Pacing Threshold Pulse Width: 0.5 ms
Lead Channel Sensing Intrinsic Amplitude: 2 mV
Lead Channel Setting Pacing Amplitude: 2 V
Lead Channel Setting Pacing Amplitude: 2 V
Lead Channel Setting Pacing Pulse Width: 0.5 ms
Lead Channel Setting Sensing Sensitivity: 2 mV
Pulse Gen Serial Number: 629155

## 2018-05-11 ENCOUNTER — Telehealth: Payer: Self-pay

## 2018-05-11 NOTE — Telephone Encounter (Signed)
Call placed to Pt to schedule for gen change with upgrade.  Pt has some questions about upgrade.  Scheduled for office visit with GT on May 6 to discuss

## 2018-05-16 ENCOUNTER — Encounter: Payer: Self-pay | Admitting: Internal Medicine

## 2018-05-16 ENCOUNTER — Other Ambulatory Visit: Payer: Self-pay

## 2018-05-16 ENCOUNTER — Ambulatory Visit (INDEPENDENT_AMBULATORY_CARE_PROVIDER_SITE_OTHER): Payer: Medicare Other | Admitting: Internal Medicine

## 2018-05-16 VITALS — BP 112/74 | HR 95 | Ht 71.0 in | Wt 366.0 lb

## 2018-05-16 DIAGNOSIS — I428 Other cardiomyopathies: Secondary | ICD-10-CM | POA: Diagnosis not present

## 2018-05-16 DIAGNOSIS — Z95810 Presence of automatic (implantable) cardiac defibrillator: Secondary | ICD-10-CM

## 2018-05-16 DIAGNOSIS — I5022 Chronic systolic (congestive) heart failure: Secondary | ICD-10-CM

## 2018-05-16 DIAGNOSIS — I442 Atrioventricular block, complete: Secondary | ICD-10-CM | POA: Diagnosis not present

## 2018-05-16 DIAGNOSIS — I10 Essential (primary) hypertension: Secondary | ICD-10-CM

## 2018-05-16 LAB — CUP PACEART INCLINIC DEVICE CHECK
Battery Remaining Longevity: 0 mo
Brady Statistic RA Percent Paced: 3.3 %
Brady Statistic RV Percent Paced: 99.67 %
Date Time Interrogation Session: 20200506151509
HighPow Impedance: 42 Ohm
Implantable Lead Implant Date: 19961213
Implantable Lead Implant Date: 20111202
Implantable Lead Location: 753859
Implantable Lead Location: 753860
Implantable Lead Model: 5524
Implantable Lead Model: 7121
Implantable Pulse Generator Implant Date: 20111202
Lead Channel Impedance Value: 3187.5 Ohm
Lead Channel Impedance Value: 375 Ohm
Lead Channel Impedance Value: 462.5 Ohm
Lead Channel Pacing Threshold Amplitude: 0.75 V
Lead Channel Pacing Threshold Amplitude: 0.75 V
Lead Channel Pacing Threshold Amplitude: 0.75 V
Lead Channel Pacing Threshold Amplitude: 0.75 V
Lead Channel Pacing Threshold Pulse Width: 0.5 ms
Lead Channel Pacing Threshold Pulse Width: 0.5 ms
Lead Channel Pacing Threshold Pulse Width: 0.5 ms
Lead Channel Pacing Threshold Pulse Width: 0.5 ms
Lead Channel Sensing Intrinsic Amplitude: 12 mV
Lead Channel Sensing Intrinsic Amplitude: 2.1 mV
Lead Channel Setting Pacing Amplitude: 2 V
Lead Channel Setting Pacing Amplitude: 2 V
Lead Channel Setting Pacing Pulse Width: 0.5 ms
Lead Channel Setting Sensing Sensitivity: 2 mV
Pulse Gen Serial Number: 629155

## 2018-05-16 LAB — CBC WITH DIFFERENTIAL/PLATELET
Basophils Absolute: 0 10*3/uL (ref 0.0–0.2)
Basos: 1 %
EOS (ABSOLUTE): 0.1 10*3/uL (ref 0.0–0.4)
Eos: 2 %
Hematocrit: 38.3 % (ref 37.5–51.0)
Hemoglobin: 12.2 g/dL — ABNORMAL LOW (ref 13.0–17.7)
Immature Grans (Abs): 0 10*3/uL (ref 0.0–0.1)
Immature Granulocytes: 0 %
Lymphocytes Absolute: 1.5 10*3/uL (ref 0.7–3.1)
Lymphs: 21 %
MCH: 25.4 pg — ABNORMAL LOW (ref 26.6–33.0)
MCHC: 31.9 g/dL (ref 31.5–35.7)
MCV: 80 fL (ref 79–97)
Monocytes Absolute: 0.7 10*3/uL (ref 0.1–0.9)
Monocytes: 10 %
Neutrophils Absolute: 4.7 10*3/uL (ref 1.4–7.0)
Neutrophils: 66 %
Platelets: 327 10*3/uL (ref 150–450)
RBC: 4.81 x10E6/uL (ref 4.14–5.80)
RDW: 13.1 % (ref 11.6–15.4)
WBC: 7.1 10*3/uL (ref 3.4–10.8)

## 2018-05-16 LAB — BASIC METABOLIC PANEL
BUN/Creatinine Ratio: 7 — ABNORMAL LOW (ref 10–24)
BUN: 8 mg/dL (ref 8–27)
CO2: 18 mmol/L — ABNORMAL LOW (ref 20–29)
Calcium: 10.3 mg/dL — ABNORMAL HIGH (ref 8.6–10.2)
Chloride: 107 mmol/L — ABNORMAL HIGH (ref 96–106)
Creatinine, Ser: 1.08 mg/dL (ref 0.76–1.27)
GFR calc Af Amer: 85 mL/min/{1.73_m2} (ref >59)
GFR calc non Af Amer: 73 mL/min/{1.73_m2} (ref >59)
Glucose: 121 mg/dL — ABNORMAL HIGH (ref 65–99)
Potassium: 4.3 mmol/L (ref 3.5–5.2)
Sodium: 139 mmol/L (ref 134–144)

## 2018-05-16 NOTE — Patient Instructions (Addendum)
Medication Instructions:  Your physician recommends that you continue on your current medications as directed. Please refer to the Current Medication list given to you today.  Labwork: You will get lab work today:  BMP and CBC.  Testing/Procedures:  Your physician has recommended that you have a new defibrillator generator inserted. An implantable cardioverter defibrillator (ICD) is a small device that is placed in your chest or, in rare cases, your abdomen. This device uses electrical pulses or shocks to help control life-threatening, irregular heartbeats that could lead the heart to suddenly stop beating (sudden cardiac arrest). Leads are attached to the ICD that goes into your heart. This is done in the hospital and usually requires an overnight stay. Please see the instruction sheet given to you today for more information.   Follow-Up:  You will follow up with device clinic 10-14 days after your procedure for a wound check.  You will follow up with Dr. Ladona Ridgel 91 days after your procedure.   INSTRUCTIONS FOR UPGRADE TO BIVENTRICULAR ICD:  Please arrive to ADMITTING down the hall from the [facility] main entrance of [facility] at:  7:30 am on May 28, 2018  Use the CHG surgical scrub as directed  Do not eat or drink after midnight prior to procedure  Do NOT TAKE your XARELTO for 2 days prior to your procedure.  Your last dose will be May 25, 2018 your PM dose.  On the morning of your procedure you MAY TAKE:  Entresto, metoprolol (lopressor) and your inhaler (symbicort).  Do not take any fluid pills or any diabetic medications.  Plan for one night stay-but you may be discharged after your procedure  You will need someone to drive you home at discharge  If you need a refill on your cardiac medications before your next appointment, please call your pharmacy.   Biventricular Pacemaker Implantation A biventricular pacemaker implantation is a procedure to place (implant) a  pacemaker into both of the lower chambers (ventricles) of the heart. A pacemaker is a small, battery-powered device that helps control the heartbeat. If the heart beats irregularly or too slowly (bradycardia), the pacemaker will pace the heart so that it beats at a normal rate or a programmed rate. The parts of a biventricular pacemaker include:  The pulse generator. The pulse generator contains a small computer and a memory system that is programmed to keep the heart beating at a certain rate. The pulse generator also produces the electrical signal that triggers the heart to beat. This is implanted under the skin of the upper chest, near the collarbone.  Wires (leads). The leads are placed in the left and right ventricles of the heart. The leads are connected to the pulse generator. They transmit electrical pulses from the pulse generator to the heart. This procedure may be done to treat:  Bradycardia.  Symptoms of severe heart failure, such as shortness of breath (dyspnea).  Loss of consciousness that happens repeatedly (syncope) because of an irregular heart rate. Tell a health care provider about:  Any allergies you have.  All medicines you are taking, including vitamins, herbs, eye drops, creams, and over-the-counter medicines.  Any problems you or family members have had with anesthetic medicines.  Any blood disorders you have.  Any surgeries you have had.  Any medical conditions you have.  Whether you are pregnant or may be pregnant. What are the risks? Generally, this is a safe procedure. However, problems may occur, including:  Infection.  Bleeding.  Allergic  reactions to medicines or dyes.  Damage to other structures or organs, such as your blood vessels, lungs, or heart.  Failure of the pacemaker to improve your condition. What happens before the procedure?  Ask your health care provider about: ? Changing or stopping your regular medicines. This is especially  important if you are taking diabetes medicines or blood thinners. ? Taking medicines such as aspirin and ibuprofen. These medicines can thin your blood. Do not take these medicines before your procedure if your health care provider instructs you not to.  Follow instructions from your health care provider about eating or drinking restrictions.  Do not use any tobacco products for at least 24 hours before your procedure. This includes cigarettes, chewing tobacco, or e-cigarettes.  Ask your health care provider how your surgical site will be marked or identified.  You may be given antibiotic medicine to help prevent infection.  You may have tests, including: ? Blood tests. ? Chest X-rays.  Plan to have someone take you home after the procedure.  If you go home right after the procedure, plan to have someone with you for 24 hours. What happens during the procedure?  To reduce your risk of infection: ? Your health care team will wash or sanitize their hands. ? Your skin will be washed with soap. ? Hair may be removed from your surgical area.  An IV tube will be inserted into one of your veins.  You will be given one or more of the following: ? A medicine to help you relax (sedative). ? A medicine to make you fall asleep (general anesthetic). ? A medicine that is injected into your spine to numb the area below and slightly above the injection site (spinal anesthetic). ? A medicine that is injected into an area of your body to numb everything below the injection site (regional anesthetic).  An incision will be made in your upper chest, near your heart.  The leads will be guided into your incision, through your blood vessels, and into your ventricles. Your surgeon will use an X-ray machine (fluoroscope) to guide the leads into your heart.  The leads will be attached to your heart muscles and to the pulse generator.  The leads will be tested to make sure that they work correctly.  The  pulse generator will be implanted under your skin, near your incision.  Your incision will be closed with stitches (sutures), skin glue, or adhesive tape.  A bandage (dressing) will be placed over your incision. The procedure may vary among health care providers and hospitals. What happens after the procedure?  Your blood pressure, heart rate, breathing rate, and blood oxygen level will be monitored often until the medicines you were given have worn off.  You may continue to receive fluids and medicines through an IV tube.  You will have some pain. Pain medicines will be available to help you.  You will have a chest X-ray done. This is to make sure that your pacemaker is in the right place.  You may have to wear compression stockings. These stockings help to prevent blood clots and reduce swelling in your legs.  You will be given a pacemaker identification card. This card lists the implant date, device model, and manufacturer of your pacemaker.  Do not drive for 24 hours if you received a sedative. This information is not intended to replace advice given to you by your health care provider. Make sure you discuss any questions you have with your health  care provider. Document Released: 09/21/2011 Document Revised: 08/14/2017 Document Reviewed: 09/21/2014 Elsevier Interactive Patient Education  2019 [employer].

## 2018-05-16 NOTE — H&P (View-Only) (Signed)
HPI [name] returns today for followup. He is a pleasant 63 yo morbidly obese man with CHB, chronic systolic heart failure, a non-ischemic CM, and HTN. He has done amazingly well and has reached ERI. He has not received an ICD shock. He denies syncope. No angina. No edema. Allergies  Allergen Reactions  . Penicillins Other (See Comments)    dizziness dizziness     Current Outpatient Medications  Medication Sig Dispense Refill  . albuterol (PROVENTIL HFA;VENTOLIN HFA) 108 (90 BASE) MCG/ACT inhaler Inhale 2 puffs into the lungs every 6 (six) hours as needed. For shortness of breath    . ENTRESTO 97-103 MG TAKE 1 TABLET BY MOUTH TWICE A DAY 180 tablet 0  . furosemide (LASIX) 40 MG tablet TAKE 1 TABLET BY MOUTH EVERY DAY 90 tablet 3  . OZEMPIC, 0.25 OR 0.5 MG/DOSE, 2 MG/1.5ML SOPN once a week.     . rivaroxaban (XARELTO) 20 MG TABS tablet Take 1 tablet (20 mg total) by mouth daily with supper. 30 tablet 11  . simvastatin (ZOCOR) 40 MG tablet Take 40 mg by mouth every evening.    Marland Kitchen spironolactone (ALDACTONE) 25 MG tablet TAKE 0.5 TABLETS (12.5 MG TOTAL) BY MOUTH DAILY. 45 tablet 2  . SYMBICORT 80-4.5 MCG/ACT inhaler Inhale 2 puffs into the lungs 2 (two) times a day.    . TRESIBA FLEXTOUCH 200 UNIT/ML SOPN Inject 70 Units into the skin daily.     . metoprolol tartrate (LOPRESSOR) 25 MG tablet Take 25 mg by mouth 2 (two) times daily.     No current facility-administered medications for this visit.    Facility-Administered Medications Ordered in Other Visits  Medication Dose Route Frequency Provider Last Rate Last Dose  . technetium pyrophosphate Tc 54m injection 21.1 millicurie  21.1 millicurie Intravenous Once Lars Masson, MD         Past Medical History:  Diagnosis Date  . Asthma   . Automatic implantable cardioverter-defibrillator in situ   . Back pain   . Chronic systolic CHF (congestive heart failure), NYHA class 3 (HCC)   . Complete heart block [facility])    s/p  PPM 1997 with upgrade to AICD 2011  . Depression   . Dilated aortic root (HCC)    94mm ascending aortic root  . DM (diabetes mellitus) (HCC)   . Glaucoma   . HTN (hypertension)   . Hyperlipidemia   . Nonischemic cardiomyopathy (HCC)    EF of 15-20% by echo 2015 and 20% by nuclear stress test 2017  . Obesity   . PAF (paroxysmal atrial fibrillation) (HCC)    13 minutes of PAF documented on ICD check 2018 - anticoagulated with Eliquis  . Sarcoid   . Sleep apnea 06-28-12   no cpap used-unable to tolerate  . SVT (supraventricular tachycardia) (HCC)    s/p ablation    ROS:   All systems reviewed and negative except as noted in the HPI.   Past Surgical History:  Procedure Laterality Date  . COLONOSCOPY WITH PROPOFOL N/A 07/17/2012   Procedure: COLONOSCOPY WITH PROPOFOL;  Surgeon: Charolett Bumpers, MD;  Location: [facility];  Service: Endoscopy;  Laterality: N/A;  . HEMORRHOID SURGERY     early 20's  . PACEMAKER INSERTION    . TONSILLECTOMY       Family History  Problem Relation Age of Onset  . Diabetes Mother   . Alzheimer's disease Mother   . CVA Father   . Heart failure Father   .  Epilepsy Sister   . Diabetes Brother   . Diabetes Sister      Social History   Socioeconomic History  . Marital status: Married    Spouse name: Not on file  . Number of children: Not on file  . Years of education: Not on file  . Highest education level: Not on file  Occupational History  . Not on file  Social Needs  . Financial resource strain: Not on file  . Food insecurity:    Worry: Not on file    Inability: Not on file  . Transportation needs:    Medical: Not on file    Non-medical: Not on file  Tobacco Use  . Smoking status: Never Smoker  . Smokeless tobacco: Never Used  Substance and Sexual Activity  . Alcohol use: Yes    Comment: occasionally  . Drug use: No  . Sexual activity: Yes  Lifestyle  . Physical activity:    Days per week: Not on file    Minutes per  session: Not on file  . Stress: Not on file  Relationships  . Social connections:    Talks on phone: Not on file    Gets together: Not on file    Attends religious service: Not on file    Active member of club or organization: Not on file    Attends meetings of clubs or organizations: Not on file    Relationship status: Not on file  . Intimate partner violence:    Fear of current or ex partner: Not on file    Emotionally abused: Not on file    Physically abused: Not on file    Forced sexual activity: Not on file  Other Topics Concern  . Not on file  Social History Narrative  . Not on file     BP 112/74   Pulse 95   Ht 5\' 11"  (1.803 m)   Wt (!) 366 lb (166 kg)   BMI 51.05 kg/m   Physical Exam:  Well appearing NAD HEENT: Unremarkable Neck:  6 cm JVD, no thyromegally Lymphatics:  No adenopathy Back:  No CVA tenderness Lungs:  Clear with no wheezes HEART:  Regular rate rhythm, no murmurs, no rubs, no clicks Abd:  soft, positive bowel sounds, no organomegally, no rebound, no guarding Ext:  2 plus pulses, no edema, no cyanosis, no clubbing Skin:  No rashes no nodules Neuro:  CN II through XII intact, motor grossly intact  EKG - NSR with ventricular pacing  DEVICE  Normal device function.  See PaceArt for details. ERI.  Assess/Plan: 1. Chronic systolic heart failure - his symptoms are well controlled and class 2. He is on optimal medicaql therapy. I discussed upgrade to a Biv ICD vs gen change of his DDD ICD. His is reflecting and will let us know when he returns for his procedure.  2. ICD - his St. Jude DDD ICD is working normally. 3. Obesity - I continue to encourage him to lose weight but this has been difficult for him. 4. CHB - he is pacing 100% in the ventricle. He has no escape.  Leonia Reeves.D.

## 2018-05-16 NOTE — Progress Notes (Signed)
    HPI [name] returns today for followup. He is a pleasant 62 yo morbidly obese man with CHB, chronic systolic heart failure, a non-ischemic CM, and HTN. He has done amazingly well and has reached ERI. He has not received an ICD shock. He denies syncope. No angina. No edema. Allergies  Allergen Reactions  . Penicillins Other (See Comments)    dizziness dizziness     Current Outpatient Medications  Medication Sig Dispense Refill  . albuterol (PROVENTIL HFA;VENTOLIN HFA) 108 (90 BASE) MCG/ACT inhaler Inhale 2 puffs into the lungs every 6 (six) hours as needed. For shortness of breath    . ENTRESTO 97-103 MG TAKE 1 TABLET BY MOUTH TWICE A DAY 180 tablet 0  . furosemide (LASIX) 40 MG tablet TAKE 1 TABLET BY MOUTH EVERY DAY 90 tablet 3  . OZEMPIC, 0.25 OR 0.5 MG/DOSE, 2 MG/1.5ML SOPN once a week.     . rivaroxaban (XARELTO) 20 MG TABS tablet Take 1 tablet (20 mg total) by mouth daily with supper. 30 tablet 11  . simvastatin (ZOCOR) 40 MG tablet Take 40 mg by mouth every evening.    . spironolactone (ALDACTONE) 25 MG tablet TAKE 0.5 TABLETS (12.5 MG TOTAL) BY MOUTH DAILY. 45 tablet 2  . SYMBICORT 80-4.5 MCG/ACT inhaler Inhale 2 puffs into the lungs 2 (two) times a day.    . TRESIBA FLEXTOUCH 200 UNIT/ML SOPN Inject 70 Units into the skin daily.     . metoprolol tartrate (LOPRESSOR) 25 MG tablet Take 25 mg by mouth 2 (two) times daily.     No current facility-administered medications for this visit.    Facility-Administered Medications Ordered in Other Visits  Medication Dose Route Frequency Provider Last Rate Last Dose  . technetium pyrophosphate Tc 99m injection 21.1 millicurie  21.1 millicurie Intravenous Once Nelson, Katarina H, MD         Past Medical History:  Diagnosis Date  . Asthma   . Automatic implantable cardioverter-defibrillator in situ   . Back pain   . Chronic systolic CHF (congestive heart failure), NYHA class 3 (HCC)   . Complete heart block (HCC)    s/p  PPM 1997 with upgrade to AICD 2011  . Depression   . Dilated aortic root (HCC)    40mm ascending aortic root  . DM (diabetes mellitus) (HCC)   . Glaucoma   . HTN (hypertension)   . Hyperlipidemia   . Nonischemic cardiomyopathy (HCC)    EF of 15-20% by echo 2015 and 20% by nuclear stress test 2017  . Obesity   . PAF (paroxysmal atrial fibrillation) (HCC)    13 minutes of PAF documented on ICD check 2018 - anticoagulated with Eliquis  . Sarcoid   . Sleep apnea 06-28-12   no cpap used-unable to tolerate  . SVT (supraventricular tachycardia) (HCC)    s/p ablation    ROS:   All systems reviewed and negative except as noted in the HPI.   Past Surgical History:  Procedure Laterality Date  . COLONOSCOPY WITH PROPOFOL N/A 07/17/2012   Procedure: COLONOSCOPY WITH PROPOFOL;  Surgeon: Martin K Johnson, MD;  Location: [facility];  Service: Endoscopy;  Laterality: N/A;  . HEMORRHOID SURGERY     early 20's  . PACEMAKER INSERTION    . TONSILLECTOMY       Family History  Problem Relation Age of Onset  . Diabetes Mother   . Alzheimer's disease Mother   . CVA Father   . Heart failure Father   .   Epilepsy Sister   . Diabetes Brother   . Diabetes Sister      Social History   Socioeconomic History  . Marital status: Married    Spouse name: Not on file  . Number of children: Not on file  . Years of education: Not on file  . Highest education level: Not on file  Occupational History  . Not on file  Social Needs  . Financial resource strain: Not on file  . Food insecurity:    Worry: Not on file    Inability: Not on file  . Transportation needs:    Medical: Not on file    Non-medical: Not on file  Tobacco Use  . Smoking status: Never Smoker  . Smokeless tobacco: Never Used  Substance and Sexual Activity  . Alcohol use: Yes    Comment: occasionally  . Drug use: No  . Sexual activity: Yes  Lifestyle  . Physical activity:    Days per week: Not on file    Minutes per  session: Not on file  . Stress: Not on file  Relationships  . Social connections:    Talks on phone: Not on file    Gets together: Not on file    Attends religious service: Not on file    Active member of club or organization: Not on file    Attends meetings of clubs or organizations: Not on file    Relationship status: Not on file  . Intimate partner violence:    Fear of current or ex partner: Not on file    Emotionally abused: Not on file    Physically abused: Not on file    Forced sexual activity: Not on file  Other Topics Concern  . Not on file  Social History Narrative  . Not on file     BP 112/74   Pulse 95   Ht 5\' 11"  (1.803 m)   Wt (!) 366 lb (166 kg)   BMI 51.05 kg/m   Physical Exam:  Well appearing NAD HEENT: Unremarkable Neck:  6 cm JVD, no thyromegally Lymphatics:  No adenopathy Back:  No CVA tenderness Lungs:  Clear with no wheezes HEART:  Regular rate rhythm, no murmurs, no rubs, no clicks Abd:  soft, positive bowel sounds, no organomegally, no rebound, no guarding Ext:  2 plus pulses, no edema, no cyanosis, no clubbing Skin:  No rashes no nodules Neuro:  CN II through XII intact, motor grossly intact  EKG - NSR with ventricular pacing  DEVICE  Normal device function.  See PaceArt for details. ERI.  Assess/Plan: 1. Chronic systolic heart failure - his symptoms are well controlled and class 2. He is on optimal medicaql therapy. I discussed upgrade to a Biv ICD vs gen change of his DDD ICD. His is reflecting and will let us know when he returns for his procedure.  2. ICD - his St. Jude DDD ICD is working normally. 3. Obesity - I continue to encourage him to lose weight but this has been difficult for him. 4. CHB - he is pacing 100% in the ventricle. He has no escape.  Leonia Reeves.D.

## 2018-05-17 ENCOUNTER — Telehealth: Payer: Self-pay | Admitting: Internal Medicine

## 2018-05-17 ENCOUNTER — Telehealth: Payer: Self-pay

## 2018-05-17 DIAGNOSIS — I5022 Chronic systolic (congestive) heart failure: Secondary | ICD-10-CM

## 2018-05-17 DIAGNOSIS — I428 Other cardiomyopathies: Secondary | ICD-10-CM

## 2018-05-17 DIAGNOSIS — I10 Essential (primary) hypertension: Secondary | ICD-10-CM

## 2018-05-17 NOTE — Telephone Encounter (Signed)
Please call his pharmacy to check to see if he has ever picked up his Lopressor

## 2018-05-17 NOTE — Telephone Encounter (Signed)
New message:     Rolly Salter calling from [facility] concering patient medication metoprolol 25 mg. Please call the office to confirm if he should be taking this medication.

## 2018-05-17 NOTE — Telephone Encounter (Addendum)
Dr. Venita Sheffield office said the patient has never taken Lopressor 25 mg, BID. We had the patient taking it since last year.

## 2018-05-17 NOTE — Telephone Encounter (Signed)
-----   Message from Quintella Reichert, MD sent at 05/14/2018  8:20 PM EDT ----- PYP scan equivocal for amyloidosis - please get a serum and urine protein electrophoresis

## 2018-05-17 NOTE — Telephone Encounter (Signed)
Reached out to patient to confirm which pharmacy he prefers and it was [facility]. The pharmacist states Metoprolol is not on patients profile and never has been filled for the patient.  [facility] saying [facility] never been filled for this patient.

## 2018-05-17 NOTE — Telephone Encounter (Signed)
Spoke with the patient, he expressed understanding about his results and will go to [facility] soon to get his labs drawn.

## 2018-05-18 ENCOUNTER — Telehealth: Payer: Self-pay | Admitting: Cardiology

## 2018-05-18 NOTE — Telephone Encounter (Signed)
This is the pharmacy it was called into a year ago  [id number] - [place], [place] - [address]

## 2018-05-18 NOTE — Telephone Encounter (Signed)
Yes the patient, never took the medication.

## 2018-05-18 NOTE — Telephone Encounter (Signed)
New Message    [name] is calling and is wondering if the patient is suppose to start taking metoprolol    Please call

## 2018-05-21 LAB — PROTEIN ELECTROPHORESIS, URINE REFLEX
Albumin ELP, Urine: 44 %
Alpha-1-Globulin, U: 2.1 %
Alpha-2-Globulin, U: 15.1 %
Beta Globulin, U: 17.7 %
Gamma Globulin, U: 21.2 %
Protein, Ur: 23.9 mg/dL

## 2018-05-21 LAB — PROTEIN ELECTROPHORESIS, SERUM
A/G Ratio: 0.9 (ref 0.7–1.7)
Albumin ELP: 3.5 g/dL (ref 2.9–4.4)
Alpha 1: 0.2 g/dL (ref 0.0–0.4)
Alpha 2: 0.9 g/dL (ref 0.4–1.0)
Beta: 1.1 g/dL (ref 0.7–1.3)
Gamma Globulin: 1.6 g/dL (ref 0.4–1.8)
Globulin, Total: 3.7 g/dL (ref 2.2–3.9)
Total Protein: 7.2 g/dL (ref 6.0–8.5)

## 2018-05-21 NOTE — Telephone Encounter (Signed)
Confirmed pt is not taking any other BB and advised pt to take BP and HR daily for a week and to call back with results per Dr. Malachy Mood advisement. Pt agreeable.

## 2018-05-21 NOTE — Telephone Encounter (Signed)
Please have patient check his BP and HR daily for a week and call with results to see if BP and HR would tolerat addition of low dose carvedilol for LV dysfunction.  Please verify his med list to make sure he is not on another BB

## 2018-05-21 NOTE — Addendum Note (Signed)
Addended by: Micki Riley C on: 05/21/2018 10:44 AM   Modules accepted: Orders

## 2018-05-21 NOTE — Progress Notes (Signed)
Remote ICD transmission.   

## 2018-05-22 ENCOUNTER — Telehealth: Payer: Self-pay | Admitting: *Deleted

## 2018-05-22 NOTE — Telephone Encounter (Signed)
Patient has been called and scheduled by scheduler.

## 2018-05-24 ENCOUNTER — Other Ambulatory Visit (HOSPITAL_COMMUNITY)
Admission: RE | Admit: 2018-05-24 | Discharge: 2018-05-24 | Disposition: A | Discharge status: Home / Self Care | Payer: Medicare Other | Source: Ambulatory Visit | Attending: Internal Medicine | Admitting: Internal Medicine

## 2018-05-24 DIAGNOSIS — Z1159 Encounter for screening for other viral diseases: Secondary | ICD-10-CM | POA: Insufficient documentation

## 2018-05-25 LAB — NOVEL CORONAVIRUS, NAA (HOSP ORDER, SEND-OUT TO REF LAB; TAT 18-24 HRS): SARS-CoV-2, NAA: NOT DETECTED

## 2018-05-28 ENCOUNTER — Other Ambulatory Visit: Payer: Self-pay

## 2018-05-28 ENCOUNTER — Ambulatory Visit (HOSPITAL_COMMUNITY)
Admission: RE | Admit: 2018-05-28 | Discharge: 2018-05-28 | Disposition: A | Discharge status: Home / Self Care | Payer: Medicare Other | Attending: Internal Medicine | Admitting: Internal Medicine

## 2018-05-28 ENCOUNTER — Encounter (HOSPITAL_COMMUNITY): Payer: Self-pay | Admitting: Internal Medicine

## 2018-05-28 ENCOUNTER — Encounter (HOSPITAL_COMMUNITY)
Admission: RE | Disposition: A | Discharge status: Home / Self Care | Payer: Self-pay | Source: Home / Self Care | Attending: Internal Medicine

## 2018-05-28 DIAGNOSIS — Z95810 Presence of automatic (implantable) cardiac defibrillator: Secondary | ICD-10-CM | POA: Insufficient documentation

## 2018-05-28 DIAGNOSIS — Z79899 Other long term (current) drug therapy: Secondary | ICD-10-CM | POA: Insufficient documentation

## 2018-05-28 DIAGNOSIS — H409 Unspecified glaucoma: Secondary | ICD-10-CM | POA: Insufficient documentation

## 2018-05-28 DIAGNOSIS — G4730 Sleep apnea, unspecified: Secondary | ICD-10-CM | POA: Insufficient documentation

## 2018-05-28 DIAGNOSIS — Z6841 Body Mass Index (BMI) 40.0 and over, adult: Secondary | ICD-10-CM | POA: Insufficient documentation

## 2018-05-28 DIAGNOSIS — I480 Paroxysmal atrial fibrillation: Secondary | ICD-10-CM | POA: Diagnosis not present

## 2018-05-28 DIAGNOSIS — Z4502 Encounter for adjustment and management of automatic implantable cardiac defibrillator: Secondary | ICD-10-CM

## 2018-05-28 DIAGNOSIS — I442 Atrioventricular block, complete: Secondary | ICD-10-CM | POA: Insufficient documentation

## 2018-05-28 DIAGNOSIS — Z7951 Long term (current) use of inhaled steroids: Secondary | ICD-10-CM | POA: Insufficient documentation

## 2018-05-28 DIAGNOSIS — I5022 Chronic systolic (congestive) heart failure: Secondary | ICD-10-CM | POA: Insufficient documentation

## 2018-05-28 DIAGNOSIS — E119 Type 2 diabetes mellitus without complications: Secondary | ICD-10-CM | POA: Diagnosis not present

## 2018-05-28 DIAGNOSIS — I110 Hypertensive heart disease with heart failure: Secondary | ICD-10-CM | POA: Insufficient documentation

## 2018-05-28 DIAGNOSIS — Z880 Allergy status to penicillin: Secondary | ICD-10-CM | POA: Insufficient documentation

## 2018-05-28 DIAGNOSIS — Z7901 Long term (current) use of anticoagulants: Secondary | ICD-10-CM | POA: Diagnosis not present

## 2018-05-28 DIAGNOSIS — E785 Hyperlipidemia, unspecified: Secondary | ICD-10-CM | POA: Diagnosis not present

## 2018-05-28 DIAGNOSIS — I428 Other cardiomyopathies: Secondary | ICD-10-CM | POA: Diagnosis not present

## 2018-05-28 DIAGNOSIS — Z950 Presence of cardiac pacemaker: Secondary | ICD-10-CM | POA: Insufficient documentation

## 2018-05-28 DIAGNOSIS — J45909 Unspecified asthma, uncomplicated: Secondary | ICD-10-CM | POA: Insufficient documentation

## 2018-05-28 DIAGNOSIS — F329 Major depressive disorder, single episode, unspecified: Secondary | ICD-10-CM | POA: Diagnosis not present

## 2018-05-28 HISTORY — PX: ICD GENERATOR CHANGEOUT: EP1231

## 2018-05-28 LAB — SURGICAL PCR SCREEN
MRSA, PCR: NEGATIVE
Staphylococcus aureus: NEGATIVE

## 2018-05-28 LAB — GLUCOSE, CAPILLARY: Glucose-Capillary: 129 mg/dL — ABNORMAL HIGH (ref 70–99)

## 2018-05-28 SURGERY — ICD GENERATOR CHANGEOUT

## 2018-05-28 MED ORDER — LIDOCAINE HCL (PF) 1 % IJ SOLN
INTRAMUSCULAR | Status: AC
  Filled 2018-05-28: qty 60

## 2018-05-28 MED ORDER — VANCOMYCIN HCL 10 G IV SOLR
1500.0000 mg | INTRAVENOUS | Status: AC
  Administered 2018-05-28: 1500 mg via INTRAVENOUS
  Filled 2018-05-28: qty 1500

## 2018-05-28 MED ORDER — MIDAZOLAM HCL 5 MG/5ML IJ SOLN
INTRAMUSCULAR | Status: AC
  Filled 2018-05-28: qty 5

## 2018-05-28 MED ORDER — ACETAMINOPHEN 325 MG PO TABS: 325.0000 mg | ORAL_TABLET | ORAL | Status: DC | PRN

## 2018-05-28 MED ORDER — MUPIROCIN 2 % EX OINT
1.0000 "application " | TOPICAL_OINTMENT | Freq: Once | CUTANEOUS | Status: AC
  Administered 2018-05-28: 09:00:00 via TOPICAL
  Filled 2018-05-28: qty 22

## 2018-05-28 MED ORDER — MIDAZOLAM HCL 5 MG/5ML IJ SOLN
INTRAMUSCULAR | Status: DC | PRN
  Administered 2018-05-28 (×2): 2 mg via INTRAVENOUS

## 2018-05-28 MED ORDER — FENTANYL CITRATE (PF) 100 MCG/2ML IJ SOLN
INTRAMUSCULAR | Status: AC
  Filled 2018-05-28: qty 2

## 2018-05-28 MED ORDER — CHLORHEXIDINE GLUCONATE 4 % EX LIQD
60.0000 mL | Freq: Once | CUTANEOUS | Status: DC
  Filled 2018-05-28: qty 60

## 2018-05-28 MED ORDER — SODIUM CHLORIDE 0.9 % IV SOLN
80.0000 mg | INTRAVENOUS | Status: AC
  Administered 2018-05-28: 80 mg
  Filled 2018-05-28: qty 2

## 2018-05-28 MED ORDER — SODIUM CHLORIDE 0.9 % IV SOLN
INTRAVENOUS | Status: AC
  Filled 2018-05-28: qty 2

## 2018-05-28 MED ORDER — MUPIROCIN 2 % EX OINT
TOPICAL_OINTMENT | CUTANEOUS | Status: AC
  Administered 2018-05-28: 09:00:00 via TOPICAL
  Filled 2018-05-28: qty 22

## 2018-05-28 MED ORDER — FENTANYL CITRATE (PF) 100 MCG/2ML IJ SOLN
INTRAMUSCULAR | Status: DC | PRN
  Administered 2018-05-28 (×2): 25 ug via INTRAVENOUS

## 2018-05-28 MED ORDER — ONDANSETRON HCL 4 MG/2ML IJ SOLN: 4.0000 mg | Freq: Four times a day (QID) | INTRAMUSCULAR | Status: DC | PRN

## 2018-05-28 MED ORDER — SODIUM CHLORIDE 0.9 % IV SOLN
INTRAVENOUS | Status: DC
  Administered 2018-05-28: 09:00:00 via INTRAVENOUS

## 2018-05-28 MED ORDER — LIDOCAINE HCL (PF) 1 % IJ SOLN
INTRAMUSCULAR | Status: DC | PRN
  Administered 2018-05-28: 50 mL

## 2018-05-28 SURGICAL SUPPLY — 5 items
ASSURA CRTD CD3369-40C (ICD Generator) ×3 IMPLANT
CABLE SURGICAL S-101-97-12 (CABLE) ×3 IMPLANT
DEFIB ASSURA CRT-D (ICD Generator) IMPLANT
PAD PRO RADIOLUCENT 2001M-C (PAD) ×3 IMPLANT
TRAY PACEMAKER INSERTION (PACKS) ×3 IMPLANT

## 2018-05-28 NOTE — Progress Notes (Signed)
Instructions given to wife Mare Loan, via telephone due to COVID19 retrictions.  All questions answered and Mare Loan verbalized understanding of instructions

## 2018-05-28 NOTE — Interval H&P Note (Signed)
History and Physical Interval Note:  05/28/2018 8:41 AM  [name]  has presented today for surgery, with the diagnosis of eri   systolic heart failure.  The various methods of treatment have been discussed with the patient and family. After consideration of risks, benefits and other options for treatment, the patient has consented to  Procedure(s): BIV UPGRADE (N/A) as a surgical intervention.  The patient's history has been reviewed, patient examined, no change in status, stable for surgery.  I have reviewed the patient's chart and labs.  Questions were answered to the patient's satisfaction.   The patient has decided to only have a generator change out. We will place a biv device in case we have to go back if his CHF worsens and add an LV lead. Lewayne Bunting, M.D.

## 2018-05-28 NOTE — Interval H&P Note (Signed)
History and Physical Interval Note:  05/28/2018 8:40 AM  [name]  has presented today for surgery, with the diagnosis of eri   systolic heart failure.  The various methods of treatment have been discussed with the patient and family. After consideration of risks, benefits and other options for treatment, the patient has consented to  Procedure(s): BIV UPGRADE (N/A) as a surgical intervention.  The patient's history has been reviewed, patient examined, no change in status, stable for surgery.  I have reviewed the patient's chart and labs.  Questions were answered to the patient's satisfaction.     Lewayne Bunting

## 2018-05-28 NOTE — Discharge Instructions (Signed)
Pacemaker Battery Change, Care After  This sheet gives you information about how to care for yourself after your procedure. Your health care provider may also give you more specific instructions. If you have problems or questions, contact your health care provider.  What can I expect after the procedure?  After your procedure, it is common to have:   Pain or soreness at the site where the pacemaker was inserted.   Swelling at the site where the pacemaker was inserted.  Follow these instructions at home:  Incision care     Keep the incision clean and dry.  ? Do not take baths, swim, or use a hot tub until your health care provider approves.  ? You may shower the day after your procedure, or as directed by your health care provider.  ? Pat the area dry with a clean towel. Do not rub the area. This may cause bleeding.   Follow instructions from your health care provider about how to take care of your incision. Make sure you:  ? Wash your hands with soap and water before you change your bandage (dressing). If soap and water are not available, use hand sanitizer.  ? Change your dressing as told by your health care provider.  ? Leave stitches (sutures), skin glue, or adhesive strips in place. These skin closures may need to stay in place for 2 weeks or longer. If adhesive strip edges start to loosen and curl up, you may trim the loose edges. Do not remove adhesive strips completely unless your health care provider tells you to do that.   Check your incision area every day for signs of infection. Check for:  ? More redness, swelling, or pain.  ? More fluid or blood.  ? Warmth.  ? Pus or a bad smell.  Activity   Do not lift anything that is heavier than 10 lb (4.5 kg) until your health care provider says it is okay to do so.   For the first 2 weeks, or as long as told by your health care provider:  ? Avoid lifting your left arm higher than your shoulder.  ? Be gentle when you move your arms over your head. It is okay  to raise your arm to comb your hair.  ? Avoid strenuous exercise.   Ask your health care provider when it is okay to:  ? Resume your normal activities.  ? Return to work or school.  ? Resume sexual activity.  Eating and drinking   Eat a heart-healthy diet. This should include plenty of fresh fruits and vegetables, whole grains, low-fat dairy products, and lean protein like chicken and fish.   Limit alcohol intake to no more than 1 drink a day for non-pregnant women and 2 drinks a day for men. One drink equals 12 oz of beer, 5 oz of wine, or 1 oz of hard liquor.   Check ingredients and nutrition facts on packaged foods and beverages. Avoid the following types of food:  ? Food that is high in salt (sodium).  ? Food that is high in saturated fat, like full-fat dairy or red meat.  ? Food that is high in trans fat, like fried food.  ? Food and drinks that are high in sugar.  Lifestyle   Do not use any products that contain nicotine or tobacco, such as cigarettes and e-cigarettes. If you need help quitting, ask your health care provider.   Take steps to manage and control your weight.     Get regular exercise. Aim for 150 minutes of moderate-intensity exercise (such as walking or yoga) or 75 minutes of vigorous exercise (such as running or swimming) each week.   Manage other health problems, such as diabetes or high blood pressure. Ask your health care provider how you can manage these conditions.  General instructions   Do not drive for 24 hours after your procedure if you were given a medicine to help you relax (sedative).   Take over-the-counter and prescription medicines only as told by your health care provider.   Avoid putting pressure on the area where the pacemaker was placed.   If you need an MRI after your pacemaker has been placed, be sure to tell the health care provider who orders the MRI that you have a pacemaker.   Avoid close and prolonged exposure to electrical devices that have strong  magnetic fields. These include:  ? Cell phones. Avoid keeping them in a pocket near the pacemaker, and try using the ear opposite the pacemaker.  ? MP3 players.  ? Household appliances, like microwaves.  ? Metal detectors.  ? Electric generators.  ? High-tension wires.   Keep all follow-up visits as directed by your health care provider. This is important.  Contact a health care provider if:   You have pain at the incision site that is not relieved by over-the-counter or prescription medicines.   You have any of these around your incision site or coming from it:  ? More redness, swelling, or pain.  ? Fluid or blood.  ? Warmth to the touch.  ? Pus or a bad smell.   You have a fever.   You feel brief, occasional palpitations, light-headedness, or any symptoms that you think might be related to your heart.  Get help right away if:   You experience chest pain that is different from the pain at the pacemaker site.   You develop a red streak that extends above or below the incision site.   You experience shortness of breath.   You have palpitations or an irregular heartbeat.   You have light-headedness that does not go away quickly.   You faint or have dizzy spells.   Your pulse suddenly drops or increases rapidly and does not return to normal.   You begin to gain weight and your legs and ankles swell.  Summary   After your procedure, it is common to have pain, soreness, and some swelling where the pacemaker was inserted.   Make sure to keep your incision clean and dry. Follow instructions from your health care provider about how to take care of your incision.   Check your incision every day for signs of infection, such as more pain or swelling, pus or a bad smell, warmth, or leaking fluid and blood.   Avoid strenuous exercise and lifting your left arm higher than your shoulder for 2 weeks, or as long as told by your health care provider.  This information is not intended to replace advice given to you by  your health care provider. Make sure you discuss any questions you have with your health care provider.  Document Released: 10/17/2012 Document Revised: 11/19/2015 Document Reviewed: 11/19/2015  Elsevier Interactive Patient Education  2019 [employer]

## 2018-05-30 MED ORDER — PROPOFOL 10 MG/ML IV BOLUS
INTRAVENOUS | Status: AC
  Filled 2018-05-30: qty 20

## 2018-05-30 MED ORDER — FENTANYL CITRATE (PF) 250 MCG/5ML IJ SOLN
INTRAMUSCULAR | Status: AC
  Filled 2018-05-30: qty 5

## 2018-05-30 MED ORDER — MIDAZOLAM HCL 2 MG/2ML IJ SOLN
INTRAMUSCULAR | Status: AC
  Filled 2018-05-30: qty 2

## 2018-06-05 ENCOUNTER — Telehealth: Payer: Self-pay | Admitting: Cardiology

## 2018-06-05 NOTE — Telephone Encounter (Signed)
Spoke with the patient, he stated he wanted to wait until his office visit with Dr. [name] in October before he decides to start a new medication. I discussed the purpose of the medication, however, he still wanted to wait.

## 2018-06-05 NOTE — Telephone Encounter (Signed)
The patient's BP was  5/11- 113/80, HR: 84 5/12-111/78, HR: 87 5/13-113/78, HR: 86 5/14-114/80, HR: 87  He stated he feels good and does not need to take metoprolol.

## 2018-06-05 NOTE — Telephone Encounter (Signed)
New message   Pt c/o medication issue:  1. Name of Medication: metoprolol  2. How are you currently taking this medication (dosage and times per day)? n/a  3. Are you having a reaction (difficulty breathing--STAT)? n/a  4. What is your medication issue? Per Hayley needs confirmation that if he is going to stay off this medication since the patient has never actually taken the medication. Please advise.

## 2018-06-05 NOTE — Telephone Encounter (Signed)
He has chronic systolic CHF with DCM and needs to be on BB.  Please add lopressor 12.5mg  BID and check BP and HR daily for a week

## 2018-06-05 NOTE — Telephone Encounter (Signed)
Updated [name] from Dr. Venita Sheffield office.

## 2018-06-07 ENCOUNTER — Other Ambulatory Visit: Payer: Self-pay

## 2018-06-07 ENCOUNTER — Ambulatory Visit (INDEPENDENT_AMBULATORY_CARE_PROVIDER_SITE_OTHER): Payer: Medicare Other | Admitting: *Deleted

## 2018-06-07 DIAGNOSIS — I442 Atrioventricular block, complete: Secondary | ICD-10-CM | POA: Diagnosis not present

## 2018-06-07 LAB — CUP PACEART INCLINIC DEVICE CHECK
Battery Remaining Longevity: 85 mo
Brady Statistic RA Percent Paced: 2.4 %
Brady Statistic RV Percent Paced: 99.13 %
Date Time Interrogation Session: 20200528102639
HighPow Impedance: 40.5 Ohm
Implantable Lead Implant Date: 19961213
Implantable Lead Implant Date: 20111202
Implantable Lead Location: 753859
Implantable Lead Location: 753860
Implantable Lead Model: 5524
Implantable Lead Model: 7121
Implantable Pulse Generator Implant Date: 20200518
Lead Channel Impedance Value: 350 Ohm
Lead Channel Impedance Value: 462.5 Ohm
Lead Channel Pacing Threshold Amplitude: 0.75 V
Lead Channel Pacing Threshold Amplitude: 0.75 V
Lead Channel Pacing Threshold Pulse Width: 0.5 ms
Lead Channel Pacing Threshold Pulse Width: 0.5 ms
Lead Channel Sensing Intrinsic Amplitude: 2.1 mV
Lead Channel Sensing Intrinsic Amplitude: 4.3 mV
Lead Channel Setting Pacing Amplitude: 2 V
Lead Channel Setting Pacing Amplitude: 2 V
Lead Channel Setting Pacing Pulse Width: 0.5 ms
Lead Channel Setting Sensing Sensitivity: 0.5 mV
Pulse Gen Serial Number: 9829382

## 2018-06-07 NOTE — Progress Notes (Signed)
°  Wound check appointment. Wound without redness, diffuse edema at site, soft, pt reports edema has decreased since implant. Picture of incision site taken with patient permission and attached to EPIC chart. Incision edges approximated, wound well healed. PT educated about s and sx of infection and bleeding. Normal device function. Thresholds, sensing, and impedances consistent with previous measurements. Outputs at chronic settings s/p gen change. Histogram distribution appropriate for patient and level of activity. One AMS episode, AT/AF burden < 1%. + Xarelto. No ventricular arrhythmias noted. Patient educated about wound care and shock plan. ROV in 3 months with implanting physician.

## 2018-06-26 ENCOUNTER — Encounter: Payer: Medicare Other | Admitting: Internal Medicine

## 2018-08-27 ENCOUNTER — Telehealth: Payer: Self-pay

## 2018-08-27 NOTE — Telephone Encounter (Signed)
Merlin alert for 1 VF episode 08/25/18, EGM shows VF 230bpm successfully terminated with ATP. Presenting rhythm is AS/VP. Pt does not recall any symptoms, states that he thinks he missed his Entresto dose that morning. Informed pt of [place] driving restrictions, pt verbalized understanding.  51 AMS episodes, burden 1.6% + xarelto. Longest episode 1 day & 7 hours, most recent 08/24/18.  Confirmed appt w/ Dr. Lovena Le on 08/31/18. Will route to Dr. Lovena Le for review.

## 2018-08-31 ENCOUNTER — Ambulatory Visit (INDEPENDENT_AMBULATORY_CARE_PROVIDER_SITE_OTHER): Payer: Medicare Other | Admitting: Internal Medicine

## 2018-08-31 ENCOUNTER — Encounter: Payer: Self-pay | Admitting: Internal Medicine

## 2018-08-31 ENCOUNTER — Other Ambulatory Visit: Payer: Self-pay

## 2018-08-31 ENCOUNTER — Ambulatory Visit (INDEPENDENT_AMBULATORY_CARE_PROVIDER_SITE_OTHER): Payer: Medicare Other | Admitting: *Deleted

## 2018-08-31 VITALS — BP 140/48 | HR 127 | Ht 71.0 in | Wt 395.0 lb

## 2018-08-31 DIAGNOSIS — I10 Essential (primary) hypertension: Secondary | ICD-10-CM

## 2018-08-31 DIAGNOSIS — I442 Atrioventricular block, complete: Secondary | ICD-10-CM | POA: Diagnosis not present

## 2018-08-31 DIAGNOSIS — I428 Other cardiomyopathies: Secondary | ICD-10-CM

## 2018-08-31 DIAGNOSIS — I5022 Chronic systolic (congestive) heart failure: Secondary | ICD-10-CM

## 2018-08-31 DIAGNOSIS — I471 Supraventricular tachycardia: Secondary | ICD-10-CM

## 2018-08-31 LAB — CUP PACEART INCLINIC DEVICE CHECK
Battery Remaining Longevity: 82 mo
Brady Statistic RA Percent Paced: 4.9 %
Brady Statistic RV Percent Paced: 99.76 %
Date Time Interrogation Session: 20200821090031
Implantable Lead Implant Date: 19961213
Implantable Lead Implant Date: 20111202
Implantable Lead Location: 753859
Implantable Lead Location: 753860
Implantable Lead Model: 5524
Implantable Lead Model: 7121
Implantable Pulse Generator Implant Date: 20200518
Lead Channel Pacing Threshold Amplitude: 0.75 V
Lead Channel Pacing Threshold Amplitude: 0.875 V
Lead Channel Pacing Threshold Pulse Width: 0.5 ms
Lead Channel Pacing Threshold Pulse Width: 0.5 ms
Lead Channel Sensing Intrinsic Amplitude: 2.6 mV
Lead Channel Sensing Intrinsic Amplitude: 3.5 mV
Lead Channel Setting Pacing Amplitude: 2 V
Lead Channel Setting Pacing Amplitude: 2 V
Lead Channel Setting Pacing Pulse Width: 0.5 ms
Lead Channel Setting Sensing Sensitivity: 0.5 mV
Pulse Gen Serial Number: 9829382

## 2018-08-31 NOTE — Patient Instructions (Signed)
Medication Instructions:  Your physician recommends that you continue on your current medications as directed. Please refer to the Current Medication list given to you today.  Labwork: None ordered.  Testing/Procedures: None ordered.  Follow-Up: Your physician recommends that you schedule a follow-up appointment in:   9 months with Dr Taylor   Any Other Special Instructions Will Be Listed Below (If Applicable).     If you need a refill on your cardiac medications before your next appointment, please call your pharmacy.  

## 2018-08-31 NOTE — Progress Notes (Signed)
HPI [name] returns today for followup. He is a pleasant morbidly obes middle aged man with a h/o non-ischemic CM, CHB, s/p DDD ICD. He refused an attempted upgrade and underwent gen change out 3 months ago. In the interim, he has done well except that he admits to dietary indiscretion. He denies anginal symptoms. He has had a single episode of VF which was pace terminated and he did not feel.  Allergies  Allergen Reactions  . Penicillins Other (See Comments)    Dizziness Did it involve swelling of the face/tongue/throat, SOB, or low BP? Yes Did it involve sudden or severe rash/hives, skin peeling, or any reaction on the inside of your mouth or nose? No Did you need to seek medical attention at a hospital or doctor's office? Yes When did it last happen?childhood If all above answers are "NO", may proceed with cephalosporin use.      Current Outpatient Medications  Medication Sig Dispense Refill  . albuterol (PROVENTIL HFA;VENTOLIN HFA) 108 (90 BASE) MCG/ACT inhaler Inhale 2 puffs into the lungs every 4 (four) hours as needed for wheezing. For shortness of breath    . ENTRESTO 97-103 MG TAKE 1 TABLET BY MOUTH TWICE A DAY 180 tablet 0  . furosemide (LASIX) 40 MG tablet TAKE 1 TABLET BY MOUTH EVERY DAY 90 tablet 3  . OZEMPIC, 0.25 OR 0.5 MG/DOSE, 2 MG/1.5ML SOPN Inject 0.5 mg into the skin once a week.     . rivaroxaban (XARELTO) 20 MG TABS tablet Take 1 tablet (20 mg total) by mouth daily with supper. 30 tablet 11  . simvastatin (ZOCOR) 40 MG tablet Take 40 mg by mouth every evening.    Marland Kitchen. spironolactone (ALDACTONE) 25 MG tablet TAKE 0.5 TABLETS (12.5 MG TOTAL) BY MOUTH DAILY. 45 tablet 2  . SYMBICORT 80-4.5 MCG/ACT inhaler Inhale 2 puffs into the lungs 2 (two) times a day.    . TRESIBA FLEXTOUCH 200 UNIT/ML SOPN Inject 70 Units into the skin every morning.      No current facility-administered medications for this visit.    Facility-Administered Medications Ordered in  Other Visits  Medication Dose Route Frequency Provider Last Rate Last Dose  . technetium pyrophosphate Tc 3638m injection 21.1 millicurie  21.1 millicurie Intravenous Once Lars MassonNelson, Katarina H, MD         Past Medical History:  Diagnosis Date  . Asthma   . Automatic implantable cardioverter-defibrillator in situ   . Back pain   . Chronic systolic CHF (congestive heart failure), NYHA class 3 (HCC)   . Complete heart block [facility](HCC)    s/p PPM 1997 with upgrade to AICD 2011  . Depression   . Dilated aortic root (HCC)    40mm ascending aortic root  . DM (diabetes mellitus) (HCC)   . Glaucoma   . HTN (hypertension)   . Hyperlipidemia   . Nonischemic cardiomyopathy (HCC)    EF of 15-20% by echo 2015 and 20% by nuclear stress test 2017  . Obesity   . PAF (paroxysmal atrial fibrillation) (HCC)    13 minutes of PAF documented on ICD check 2018 - anticoagulated with Eliquis  . Sarcoid   . Sleep apnea 06-28-12   no cpap used-unable to tolerate  . SVT (supraventricular tachycardia) (HCC)    s/p ablation    ROS:   All systems reviewed and negative except as noted in the HPI.   Past Surgical History:  Procedure Laterality Date  . COLONOSCOPY WITH PROPOFOL N/A  07/17/2012   Procedure: COLONOSCOPY WITH PROPOFOL;  Surgeon: Garlan Fair, MD;  Location: [facility];  Service: Endoscopy;  Laterality: N/A;  . HEMORRHOID SURGERY     early 20's  . ICD GENERATOR CHANGEOUT N/A 05/28/2018   Procedure: ICD GENERATOR CHANGEOUT;  Surgeon: Evans Lance, MD;  Location: [place] [facility];  Service: Cardiovascular;  Laterality: N/A;  . PACEMAKER INSERTION    . TONSILLECTOMY       Family History  Problem Relation Age of Onset  . Diabetes Mother   . Alzheimer's disease Mother   . CVA Father   . Heart failure Father   . Epilepsy Sister   . Diabetes Brother   . Diabetes Sister      Social History   Socioeconomic History  . Marital status: Married    Spouse name: Not on file  . Number of  children: Not on file  . Years of education: Not on file  . Highest education level: Not on file  Occupational History  . Not on file  Social Needs  . Financial resource strain: Not on file  . Food insecurity    Worry: Not on file    Inability: Not on file  . Transportation needs    Medical: Not on file    Non-medical: Not on file  Tobacco Use  . Smoking status: Never Smoker  . Smokeless tobacco: Never Used  Substance and Sexual Activity  . Alcohol use: Yes    Comment: occasionally  . Drug use: No  . Sexual activity: Yes  Lifestyle  . Physical activity    Days per week: Not on file    Minutes per session: Not on file  . Stress: Not on file  Relationships  . Social Herbalist on phone: Not on file    Gets together: Not on file    Attends religious service: Not on file    Active member of club or organization: Not on file    Attends meetings of clubs or organizations: Not on file    Relationship status: Not on file  . Intimate partner violence    Fear of current or ex partner: Not on file    Emotionally abused: Not on file    Physically abused: Not on file    Forced sexual activity: Not on file  Other Topics Concern  . Not on file  Social History Narrative  . Not on file     There were no vitals taken for this visit.  Physical Exam:  Well appearing NAD HEENT: Unremarkable Neck:  No JVD, no thyromegally Lymphatics:  No adenopathy Back:  No CVA tenderness Lungs:  Clear with no wheezes HEART:  Regular rate rhythm, no murmurs, no rubs, no clicks Abd:  soft, positive bowel sounds, no organomegally, no rebound, no guarding Ext:  2 plus pulses, no edema, no cyanosis, no clubbing Skin:  No rashes no nodules Neuro:  CN II through XII intact, motor grossly intact  EKG - nsr with p synchronous ventricular pacing  DEVICE  Normal device function.  See PaceArt for details.   Assess/Plan: 1. VF - he has had single episode. He device was programmed to allow  for longer duration of VF or VT before therapy. 2. Chronic systolic heart failure - his symptoms remain class 2 despite his size and LV dysfunction. He will continue his current meds. 3. Obesity - he admits to dietary indiscretion. I strongly encouraged him to reduce his salt intake.  4. Atrial fib - he is asymptomatic, maintaining NSR and on Xarleto.  Leonia Reeves.D.

## 2018-09-02 LAB — CUP PACEART REMOTE DEVICE CHECK
Date Time Interrogation Session: 20200823113010
Implantable Lead Implant Date: 19961213
Implantable Lead Implant Date: 20111202
Implantable Lead Location: 753859
Implantable Lead Location: 753860
Implantable Lead Model: 5524
Implantable Lead Model: 7121
Implantable Pulse Generator Implant Date: 20200518
Pulse Gen Serial Number: 9829382

## 2018-09-02 NOTE — Telephone Encounter (Signed)
Reviewed. No change. GT

## 2018-09-07 ENCOUNTER — Encounter: Payer: Self-pay | Admitting: Cardiology

## 2018-09-07 NOTE — Progress Notes (Signed)
Remote ICD transmission.   

## 2018-09-07 NOTE — Addendum Note (Signed)
Addended by: Tiajuana Amass on: 09/07/2018 11:37 AM   Modules accepted: Level of Service

## 2018-10-31 ENCOUNTER — Ambulatory Visit: Payer: Medicare Other | Admitting: Cardiology

## 2018-10-31 NOTE — Progress Notes (Deleted)
Cardiology Office Note:    Date:  10/31/2018   ID:  [name], DOB 12-08-1955, MRN [id number]  PCP:  Wenda Low, MD  Cardiologist:  Fransico Him, MD    Referring MD: Wenda Low, MD   No chief complaint on file.   History of Present Illness:    [name] is a 63 y.o. male with a hx of nonischemic DCM (normal coronary arteries by cath 1996), Sarcoidosis, chronic systolic CHF, HTN, CHB s/p dual chamber AICD and SVT s/p ablation.  He was seen back by Dr. Lovena Le in August after undergoing generator change out.  Dr. Lovena Le had recommended BiV AICD upgrade from DDD ICD but patient refused.  At time of OV in August he had had 1 episode of VF pace terminated.   Past Medical History:  Diagnosis Date  . Asthma   . Automatic implantable cardioverter-defibrillator in situ   . Back pain   . Chronic systolic CHF (congestive heart failure), NYHA class 3 ([place])   . Complete heart block [facility])    s/p PPM 1997 with upgrade to AICD 2011  . Depression   . Dilated aortic root (HCC)    67mm ascending aortic root  . DM (diabetes mellitus) ([place])   . Glaucoma   . HTN (hypertension)   . Hyperlipidemia   . Nonischemic cardiomyopathy ([place])    EF of 15-20% by echo 2015 and 20% by nuclear stress test 2017  . Obesity   . PAF (paroxysmal atrial fibrillation) (HCC)    13 minutes of PAF documented on ICD check 2018 - anticoagulated with Eliquis  . Sarcoid   . Sleep apnea 06-28-12   no cpap used-unable to tolerate  . SVT (supraventricular tachycardia) (HCC)    s/p ablation    Past Surgical History:  Procedure Laterality Date  . COLONOSCOPY WITH PROPOFOL N/A 07/17/2012   Procedure: COLONOSCOPY WITH PROPOFOL;  Surgeon: Garlan Fair, MD;  [facility];  Service: Endoscopy;  Laterality: N/A;  . HEMORRHOID SURGERY     early 20's  . ICD GENERATOR CHANGEOUT N/A 05/28/2018   Procedure: ICD GENERATOR CHANGEOUT;  Surgeon: Evans Lance, MD;  Location: [place] [facility];   Service: Cardiovascular;  Laterality: N/A;  . PACEMAKER INSERTION    . TONSILLECTOMY      Current Medications: No outpatient medications have been marked as taking for the 10/31/18 encounter (Appointment) with Sueanne Margarita, MD.     Allergies:   Penicillins   Social History   Socioeconomic History  . Marital status: Married    Spouse name: Not on file  . Number of children: Not on file  . Years of education: Not on file  . Highest education level: Not on file  Occupational History  . Not on file  Social Needs  . Financial resource strain: Not on file  . Food insecurity    Worry: Not on file    Inability: Not on file  . Transportation needs    Medical: Not on file    Non-medical: Not on file  Tobacco Use  . Smoking status: Never Smoker  . Smokeless tobacco: Never Used  Substance and Sexual Activity  . Alcohol use: Yes    Comment: occasionally  . Drug use: No  . Sexual activity: Yes  Lifestyle  . Physical activity    Days per week: Not on file    Minutes per session: Not on file  . Stress: Not on file  Relationships  . Social  connections    Talks on phone: Not on file    Gets together: Not on file    Attends religious service: Not on file    Active member of club or organization: Not on file    Attends meetings of clubs or organizations: Not on file    Relationship status: Not on file  Other Topics Concern  . Not on file  Social History Narrative  . Not on file     Family History: The patient's ***family history includes Alzheimer's disease in his mother; CVA in his father; Diabetes in his brother, mother, and sister; Epilepsy in his sister; Heart failure in his father.  ROS:   Please see the history of present illness.    ROS  All other systems reviewed and negative.   EKGs/Labs/Other Studies Reviewed:    The following studies were reviewed today: ***  EKG:  EKG is *** ordered today.  The ekg ordered today demonstrates ***  Recent Labs:  05/16/2018: BUN 8; Creatinine, Ser 1.08; Hemoglobin 12.2; Platelets 327; Potassium 4.3; Sodium 139   Recent Lipid Panel    Component Value Date/Time   CHOL  09/22/2009 0330    134        ATP III CLASSIFICATION:  <200     mg/dL   Desirable  277-824  mg/dL   Borderline High  >=235    mg/dL   High          TRIG 361 09/22/2009 0330   HDL 47 09/22/2009 0330   CHOLHDL 2.9 09/22/2009 0330   VLDL 25 09/22/2009 0330   LDLCALC  09/22/2009 0330    62        Total Cholesterol/HDL:CHD Risk Coronary Heart Disease Risk Table                     Men   Women  1/2 Average Risk   3.4   3.3  Average Risk       5.0   4.4  2 X Average Risk   9.6   7.1  3 X Average Risk  23.4   11.0        Use the calculated Patient Ratio above and the CHD Risk Table to determine the patient's CHD Risk.        ATP III CLASSIFICATION (LDL):  <100     mg/dL   Optimal  443-154  mg/dL   Near or Above                    Optimal  130-159  mg/dL   Borderline  008-676  mg/dL   High  >195     mg/dL   Very High    Physical Exam:    VS:  There were no vitals taken for this visit.    Wt Readings from Last 3 Encounters:  08/31/18 (!) 395 lb (179.2 kg)  05/28/18 (!) 360 lb (163.3 kg)  05/16/18 (!) 366 lb (166 kg)     GEN: *** Well nourished, well developed in no acute distress HEENT: Normal NECK: No JVD; No carotid bruits LYMPHATICS: No lymphadenopathy CARDIAC: ***RRR, no murmurs, rubs, gallops RESPIRATORY:  Clear to auscultation without rales, wheezing or rhonchi  ABDOMEN: Soft, non-tender, non-distended MUSCULOSKELETAL:  No edema; No deformity  SKIN: Warm and dry NEUROLOGIC:  Alert and oriented x 3 PSYCHIATRIC:  Normal affect   ASSESSMENT:    No diagnosis found. PLAN:    In order of problems listed above:  ***  Medication Adjustments/Labs and Tests Ordered: Current medicines are reviewed at length with the patient today.  Concerns regarding medicines are outlined above.  No orders of the defined  types were placed in this encounter.  No orders of the defined types were placed in this encounter.   Signed, [name], MD  10/31/2018 8:45 AM    [facility]

## 2018-11-30 ENCOUNTER — Ambulatory Visit (INDEPENDENT_AMBULATORY_CARE_PROVIDER_SITE_OTHER): Payer: Medicare Other | Admitting: *Deleted

## 2018-11-30 DIAGNOSIS — I428 Other cardiomyopathies: Secondary | ICD-10-CM

## 2018-11-30 LAB — CUP PACEART REMOTE DEVICE CHECK
Date Time Interrogation Session: 20201120064108
Implantable Lead Implant Date: 19961213
Implantable Lead Implant Date: 20111202
Implantable Lead Location: 753859
Implantable Lead Location: 753860
Implantable Lead Model: 5524
Implantable Lead Model: 7121
Implantable Pulse Generator Implant Date: 20200518
Pulse Gen Serial Number: 9829382

## 2018-12-11 ENCOUNTER — Telehealth: Payer: Self-pay | Admitting: Internal Medicine

## 2018-12-11 NOTE — Telephone Encounter (Signed)
lmtcb with [facility] physicians pharmacy line.

## 2018-12-11 NOTE — Telephone Encounter (Signed)
I spoke to [facility] from [facility] and informed her that the patient had refused to start BB Metoprolol when prescribed back in May by Dr Radford Pax.  She thanked me for the call and I told her that the patient has an appointment with Dr Radford Pax on 12/2.

## 2018-12-11 NOTE — Telephone Encounter (Signed)
New Message      Pt c/o medication issue:  1. Name of Medication: Metoprolol 25 mg  2 x daily   2. How are you currently taking this medication (dosage and times per day)? Not taking   3. Are you having a reaction (difficulty breathing--STAT)? n/a  4. What is your medication issue? [name] is calling from [facility] and is wondering if the pt is suppose to be taking this medication   Please call

## 2018-12-12 ENCOUNTER — Ambulatory Visit: Payer: Medicare Other | Admitting: Cardiology

## 2018-12-12 ENCOUNTER — Other Ambulatory Visit: Payer: Self-pay

## 2018-12-12 ENCOUNTER — Encounter: Payer: Self-pay | Admitting: Cardiology

## 2018-12-12 VITALS — BP 124/66 | HR 95 | Ht 71.0 in | Wt 371.8 lb

## 2018-12-12 DIAGNOSIS — I428 Other cardiomyopathies: Secondary | ICD-10-CM

## 2018-12-12 DIAGNOSIS — I442 Atrioventricular block, complete: Secondary | ICD-10-CM

## 2018-12-12 DIAGNOSIS — I5022 Chronic systolic (congestive) heart failure: Secondary | ICD-10-CM

## 2018-12-12 DIAGNOSIS — I77810 Thoracic aortic ectasia: Secondary | ICD-10-CM

## 2018-12-12 DIAGNOSIS — I471 Supraventricular tachycardia: Secondary | ICD-10-CM

## 2018-12-12 DIAGNOSIS — I480 Paroxysmal atrial fibrillation: Secondary | ICD-10-CM

## 2018-12-12 DIAGNOSIS — I10 Essential (primary) hypertension: Secondary | ICD-10-CM

## 2018-12-12 MED ORDER — METOPROLOL TARTRATE 25 MG PO TABS: 12.5000 mg | ORAL_TABLET | Freq: Two times a day (BID) | ORAL | 3 refills | Status: DC

## 2018-12-12 NOTE — Progress Notes (Signed)
Cardiology Office Note:    Date:  12/12/2018   ID:  [name], DOB 08-May-1955, MRN [id number]  PCP:  Wenda Low, MD  Cardiologist:  Fransico Him, MD    Referring MD: Wenda Low, MD   Chief Complaint  Patient presents with  . Cardiomyopathy  . Congestive Heart Failure  . Hypertension    History of Present Illness:    [name] is a 63 y.o. male with a hx of nonischemic DCM (normal coronary arteries by cath 1996), Sarcoidosis, chronic systolic CHF, HTN, CHB s/p dual chamber AICD and SVT s/p ablation.  He is here today for followup and is doing well.  He denies any chest pain or pressure, PND, orthopnea, LE edema, dizziness, palpitations or syncope.  He has chronic SOB which is very stable.   He is compliant with his meds and is tolerating meds with no SE.    Past Medical History:  Diagnosis Date  . Asthma   . Automatic implantable cardioverter-defibrillator in situ   . Back pain   . Chronic systolic CHF (congestive heart failure), NYHA class 3 ([place])   . Complete heart block [facility])    s/p PPM 1997 with upgrade to AICD 2011  . Depression   . Dilated aortic root (HCC)    40mm ascending aortic root  . DM (diabetes mellitus) ([place])   . Glaucoma   . HTN (hypertension)   . Hyperlipidemia   . Nonischemic cardiomyopathy ([place])    EF of 15-20% by echo 2015 and 20% by nuclear stress test 2017  . Obesity   . PAF (paroxysmal atrial fibrillation) (HCC)    13 minutes of PAF documented on ICD check 2018 - anticoagulated with Eliquis  . Sarcoid   . Sleep apnea 06-28-12   no cpap used-unable to tolerate  . SVT (supraventricular tachycardia) (HCC)    s/p ablation    Past Surgical History:  Procedure Laterality Date  . COLONOSCOPY WITH PROPOFOL N/A 07/17/2012   Procedure: COLONOSCOPY WITH PROPOFOL;  Surgeon: Garlan Fair, MD;  [facility];  Service: Endoscopy;  Laterality: N/A;  . HEMORRHOID SURGERY     early 20's  . ICD GENERATOR CHANGEOUT N/A  05/28/2018   Procedure: ICD GENERATOR CHANGEOUT;  Surgeon: Evans Lance, MD;  Location: [place] [facility];  Service: Cardiovascular;  Laterality: N/A;  . PACEMAKER INSERTION    . TONSILLECTOMY      Current Medications: Current Meds  Medication Sig  . albuterol (PROVENTIL HFA;VENTOLIN HFA) 108 (90 BASE) MCG/ACT inhaler Inhale 2 puffs into the lungs every 4 (four) hours as needed for wheezing. For shortness of breath  . ENTRESTO 97-103 MG TAKE 1 TABLET BY MOUTH TWICE A DAY  . furosemide (LASIX) 40 MG tablet TAKE 1 TABLET BY MOUTH EVERY DAY  . OZEMPIC, 0.25 OR 0.5 MG/DOSE, 2 MG/1.5ML SOPN Inject 0.5 mg into the skin once a week.   . rivaroxaban (XARELTO) 20 MG TABS tablet Take 1 tablet (20 mg total) by mouth daily with supper.  . simvastatin (ZOCOR) 40 MG tablet Take 40 mg by mouth every evening.  Marland Kitchen spironolactone (ALDACTONE) 25 MG tablet TAKE 0.5 TABLETS (12.5 MG TOTAL) BY MOUTH DAILY.  . SYMBICORT 80-4.5 MCG/ACT inhaler Inhale 2 puffs into the lungs 2 (two) times a day.  . TRESIBA FLEXTOUCH 200 UNIT/ML SOPN Inject 70 Units into the skin every morning.      Allergies:   Penicillins   Social History   Socioeconomic History  .  Marital status: Married    Spouse name: Not on file  . Number of children: Not on file  . Years of education: Not on file  . Highest education level: Not on file  Occupational History  . Not on file  Social Needs  . Financial resource strain: Not on file  . Food insecurity    Worry: Not on file    Inability: Not on file  . Transportation needs    Medical: Not on file    Non-medical: Not on file  Tobacco Use  . Smoking status: Never Smoker  . Smokeless tobacco: Never Used  Substance and Sexual Activity  . Alcohol use: Yes    Comment: occasionally  . Drug use: No  . Sexual activity: Yes  Lifestyle  . Physical activity    Days per week: Not on file    Minutes per session: Not on file  . Stress: Not on file  Relationships  . Social Wellsite geologist on phone: Not on file    Gets together: Not on file    Attends religious service: Not on file    Active member of club or organization: Not on file    Attends meetings of clubs or organizations: Not on file    Relationship status: Not on file  Other Topics Concern  . Not on file  Social History Narrative  . Not on file     Family History: The patient's family history includes Alzheimer's disease in his mother; CVA in his father; Diabetes in his brother, mother, and sister; Epilepsy in his sister; Heart failure in his father.  ROS:   Please see the history of present illness.    ROS  All other systems reviewed and negative.   EKGs/Labs/Other Studies Reviewed:    The following studies were reviewed today: none  EKG:  EKG is not ordered today.   Recent Labs: 05/16/2018: BUN 8; Creatinine, Ser 1.08; Hemoglobin 12.2; Platelets 327; Potassium 4.3; Sodium 139   Recent Lipid Panel    Component Value Date/Time   CHOL  09/22/2009 0330    134        ATP III CLASSIFICATION:  <200     mg/dL   Desirable  412-878  mg/dL   Borderline High  >=676    mg/dL   High          TRIG 720 09/22/2009 0330   HDL 47 09/22/2009 0330   CHOLHDL 2.9 09/22/2009 0330   VLDL 25 09/22/2009 0330   LDLCALC  09/22/2009 0330    62        Total Cholesterol/HDL:CHD Risk Coronary Heart Disease Risk Table                     Men   Women  1/2 Average Risk   3.4   3.3  Average Risk       5.0   4.4  2 X Average Risk   9.6   7.1  3 X Average Risk  23.4   11.0        Use the calculated Patient Ratio above and the CHD Risk Table to determine the patient's CHD Risk.        ATP III CLASSIFICATION (LDL):  <100     mg/dL   Optimal  947-096  mg/dL   Near or Above                    Optimal  130-159  mg/dL   Borderline  027-253  mg/dL   High  >664     mg/dL   Very High    Physical Exam:    VS:  BP 124/66   Pulse 95   Ht 5\' 11"  (1.803 m)   Wt (!) 371 lb 12.8 oz (168.6 kg)   SpO2 97%   BMI  51.86 kg/m     Wt Readings from Last 3 Encounters:  12/12/18 (!) 371 lb 12.8 oz (168.6 kg)  08/31/18 (!) 395 lb (179.2 kg)  05/28/18 (!) 360 lb (163.3 kg)     GEN:  Well nourished, well developed in no acute distress HEENT: Normal NECK: No JVD; No carotid bruits LYMPHATICS: No lymphadenopathy CARDIAC: RRR, no murmurs, rubs, gallops RESPIRATORY:  Clear to auscultation without rales, wheezing or rhonchi  ABDOMEN: Soft, non-tender, non-distended MUSCULOSKELETAL:  No edema; No deformity  SKIN: Warm and dry NEUROLOGIC:  Alert and oriented x 3 PSYCHIATRIC:  Normal affect   ASSESSMENT:    1. Chronic systolic heart failure (HCC)   2. Nonischemic cardiomyopathy (HCC)   3. Essential hypertension   4. Complete heart block (HCC)   5. SVT (supraventricular tachycardia) (HCC)   6. PAF (paroxysmal atrial fibrillation) (HCC)   7. Dilated aortic root (HCC)   8. Morbid obesity (HCC)    PLAN:    In order of problems listed above:  1.  Chronic systolic CHF  -he appears euvolemic on exam today -his weight is stable -NYHA class IIa -continue Entresto 97-103mg  BID, lasix 40mg  daily, Lopressor 12.5mg  BID and spiro 12.5mg  daily   -2D echo 02/2017 with EF 15-20%  2.  NIDCM -normal coronary arteries were noted at time of cath and work-up of his cardiomyopathy.   -His last echo in February 2019 showed EF 15 to 20%.   -He is status post dual-chamber AICD.   -PYP scan equivocal  for amyloid  -urine and protein electrophoresis were normal  3.  HTN  -BP controlled on exam.  -He is following a low sodium diet.   -continue lopressor 12.5mg  BID, Entresto and Spiro 12.5mg  daily  4.  CHB   -he is status post dual-chamber AICD and is followed in device clinic.  5.  SVT  -he is status post SVT ablation remotely.  -he has not had any palpitations -Continue on Lopressor 12.5mg  BID  6.  PAF  -he was noted on ICD check in 2018 to have PAF. -His CHADS2VASC score is 2 and will remain on  Xarelto 20mg  daily.   -He has not had any problems with excessive bleeding.  -He will continue on Lopressor 12.5mg  BID.   -His last creatinine was 1.2. in July and Hbg 11.8  7.  Dilated aortic root  -2D echo 03/01/2017 showed Aortic root at 35 mm.   8.  Morbid Obesity -I have encouraged him to get into a routine exercise program and cut back on carbs and portions.    Medication Adjustments/Labs and Tests Ordered: Current medicines are reviewed at length with the patient today.  Concerns regarding medicines are outlined above.  No orders of the defined types were placed in this encounter.  Meds ordered this encounter  Medications  . metoprolol tartrate (LOPRESSOR) 25 MG tablet    Sig: Take 0.5 tablets (12.5 mg total) by mouth 2 (two) times daily.    Dispense:  90 tablet    Refill:  3    Signed, [name], MD  12/12/2018 2:40 PM    [facility]

## 2018-12-12 NOTE — Patient Instructions (Signed)
Medication Instructions:  Your physician has recommended you make the following change in your medication:  1) Start taking Lopressor (metoprolol) 12.5 mg (half a tablet) twice daily  *If you need a refill on your cardiac medications before your next appointment, please call your pharmacy*  Lab Work: None ordered.   If you have labs (blood work) drawn today and your tests are completely normal, you will receive your results only by: Marland Kitchen MyChart Message (if you have MyChart) OR . A paper copy in the mail If you have any lab test that is abnormal or we need to change your treatment, we will call you to review the results.  Testing/Procedures: None ordered.  Follow-Up: At [facility], you and your health needs are our priority.  As part of our continuing mission to provide you with exceptional heart care, we have created designated Provider Care Teams.  These Care Teams include your primary Cardiologist (physician) and Advanced Practice Providers (APPs -  Physician Assistants and Nurse Practitioners) who all work together to provide you with the care you need, when you need it.  Your next appointment:   January 5th, 2021  The format for your next appointment:   Virtual Visit   Provider:   Ermalinda Barrios, PA-C  Follow up with Estella Husk, PA-C in 6 months.   Follow up with Dr. Radford Pax in 1 year.

## 2018-12-25 NOTE — Progress Notes (Signed)
Remote ICD transmission.   

## 2019-01-14 NOTE — Progress Notes (Signed)
Virtual Visit via Telephone Note   This visit type was conducted due to national recommendations for restrictions regarding the COVID-19 Pandemic (e.g. social distancing) in an effort to limit this patient's exposure and mitigate transmission in our community.  Due to his co-morbid illnesses, this patient is at least at moderate risk for complications without adequate follow up.  This format is felt to be most appropriate for this patient at this time.  The patient did not have access to video technology/had technical difficulties with video requiring transitioning to audio format only (telephone).  All issues noted in this document were discussed and addressed.  No physical exam could be performed with this format.  Please refer to the patient's chart for his  consent to telehealth for [facility].   Date:  01/15/2019   ID:  [name], DOB 01-08-1956, MRN [id number]  Patient Location: Home Provider Location: Home  PCP:  Georgann Housekeeper, MD  Cardiologist:  [name], MD   Electrophysiologist:  None   Evaluation Performed:  Follow-Up Visit  Chief Complaint:  Follow up  History of Present Illness:    [name] is a 64 y.o. 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   Last office visit with Dr. [name] 12/12/2018 patient was doing well with no changes made.  Patient denies dyspnea, chest pain, edema. Has occasional red bloody streaks in stool from hemorrhoids. Says it's not a lot.No regular exercise because of chronic dyspnea on exertion. No changes in symptoms.   The patient does not have symptoms concerning for COVID-19 infection (fever, chills, cough, or new shortness of breath).    Past Medical History:  Diagnosis Date  . Asthma   . Automatic implantable cardioverter-defibrillator in situ   . Back pain   .  Chronic systolic CHF (congestive heart failure), NYHA class 3 (HCC)   . Complete heart block [facility])    s/p PPM 1997 with upgrade to AICD 2011  . Depression   . Dilated aortic root (HCC)    78mm ascending aortic root  . DM (diabetes mellitus) (HCC)   . Glaucoma   . HTN (hypertension)   . Hyperlipidemia   . Nonischemic cardiomyopathy (HCC)    EF of 15-20% by echo 2015 and 20% by nuclear stress test 2017  . Obesity   . PAF (paroxysmal atrial fibrillation) (HCC)    13 minutes of PAF documented on ICD check 2018 - anticoagulated with Eliquis  . Sarcoid   . Sleep apnea 06-28-12   no cpap used-unable to tolerate  . SVT (supraventricular tachycardia) (HCC)    s/p ablation   Past Surgical History:  Procedure Laterality Date  . COLONOSCOPY WITH PROPOFOL N/A 07/17/2012   Procedure: COLONOSCOPY WITH PROPOFOL;  Surgeon: Charolett Bumpers, MD;  Location: [facility] ENDOSCOPY;  Service: Endoscopy;  Laterality: N/A;  . HEMORRHOID SURGERY     early 20's  . ICD GENERATOR CHANGEOUT N/A 05/28/2018   Procedure: ICD GENERATOR CHANGEOUT;  Surgeon: Marinus Maw, MD;  Location: [facility] INVASIVE CV LAB;  Service: Cardiovascular;  Laterality: N/A;  . PACEMAKER INSERTION    . TONSILLECTOMY       Current Meds  Medication Sig  . albuterol (PROVENTIL HFA;VENTOLIN HFA) 108 (90 BASE) MCG/ACT inhaler Inhale 2 puffs into the lungs every 4 (four) hours as needed for wheezing. For  shortness of breath  . ENTRESTO 97-103 MG TAKE 1 TABLET BY MOUTH TWICE A DAY  . furosemide (LASIX) 40 MG tablet TAKE 1 TABLET BY MOUTH EVERY DAY  . metoprolol tartrate (LOPRESSOR) 25 MG tablet Take 0.5 tablets (12.5 mg total) by mouth 2 (two) times daily.  Marland Kitchen OZEMPIC, 0.25 OR 0.5 MG/DOSE, 2 MG/1.5ML SOPN Inject 0.5 mg into the skin once a week.   . rivaroxaban (XARELTO) 20 MG TABS tablet Take 1 tablet (20 mg total) by mouth daily with supper.  . simvastatin (ZOCOR) 40 MG tablet Take 40 mg by mouth every evening.  Marland Kitchen spironolactone (ALDACTONE) 25 MG  tablet TAKE 0.5 TABLETS (12.5 MG TOTAL) BY MOUTH DAILY.  . SYMBICORT 80-4.5 MCG/ACT inhaler Inhale 2 puffs into the lungs 2 (two) times a day.  . TRESIBA FLEXTOUCH 200 UNIT/ML SOPN Inject 70 Units into the skin every morning.      Allergies:   Penicillins   Social History   Tobacco Use  . Smoking status: Never Smoker  . Smokeless tobacco: Never Used  Substance Use Topics  . Alcohol use: Yes    Comment: occasionally  . Drug use: No     Family Hx: The patient's family history includes Alzheimer's disease in his mother; CVA in his father; Diabetes in his brother, mother, and sister; Epilepsy in his sister; Heart failure in his father.  ROS:   Please see the history of present illness.      All other systems reviewed and are negative.   Prior CV studies:   The following studies were reviewed today:  2D echo 2/11/2019Study Conclusions   - Left ventricle: The cavity size was moderately dilated. There was   severe concentric hypertrophy. Systolic function was normal. The   estimated ejection fraction was in the range of 15% to 20%. Wall   motion was normal; there were no regional wall motion   abnormalities. Features are consistent with a pseudonormal left   ventricular filling pattern, with concomitant abnormal relaxation   and increased filling pressure (grade 2 diastolic dysfunction).   Doppler parameters are consistent with elevated ventricular   end-diastolic filling pressure. - Aortic valve: There was trivial regurgitation. - Mitral valve: There was mild regurgitation. - Left atrium: The atrium was moderately dilated. - Right ventricle: The cavity size was moderately dilated. Wall   thickness was normal. Systolic function was moderately reduced. - Right atrium: The atrium was normal in size. - Tricuspid valve: There was mild regurgitation. - Pulmonary arteries: Systolic pressure was within the normal   range. - Inferior vena cava: The vessel was normal in size. The    respirophasic diameter changes were in the normal range (= 50%),   consistent with normal central venous pressure. - Pericardium, extracardiac: There was no pericardial effusion.   Labs/Other Tests and Data Reviewed:    EKG:  No ECG reviewed.  Recent Labs: 05/16/2018: BUN 8; Creatinine, Ser 1.08; Hemoglobin 12.2; Platelets 327; Potassium 4.3; Sodium 139   Recent Lipid Panel Lab Results  Component Value Date/Time   CHOL  09/22/2009 03:30 AM    134        ATP III CLASSIFICATION:  <200     mg/dL   Desirable  200-239  mg/dL   Borderline High  >=240    mg/dL   High          TRIG 125 09/22/2009 03:30 AM   HDL 47 09/22/2009 03:30 AM   CHOLHDL 2.9 09/22/2009 03:30 AM   LDLCALC  09/22/2009 03:30 AM    62        Total Cholesterol/HDL:CHD Risk Coronary Heart Disease Risk Table                     Men   Women  1/2 Average Risk   3.4   3.3  Average Risk       5.0   4.4  2 X Average Risk   9.6   7.1  3 X Average Risk  23.4   11.0        Use the calculated Patient Ratio above and the CHD Risk Table to determine the patient's CHD Risk.        ATP III CLASSIFICATION (LDL):  <100     mg/dL   Optimal  716-967  mg/dL   Near or Above                    Optimal  130-159  mg/dL   Borderline  893-810  mg/dL   High  >175     mg/dL   Very High    Wt Readings from Last 3 Encounters:  01/15/19 (!) 366 lb (166 kg)  12/12/18 (!) 371 lb 12.8 oz (168.6 kg)  08/31/18 (!) 395 lb (179.2 kg)     Objective:    Vital Signs:  BP 103/68   Pulse 77   Ht 5\' 11"  (1.803 m)   Wt (!) 366 lb (166 kg)   BMI 51.05 kg/m    VITAL SIGNS:  reviewed  ASSESSMENT & PLAN:    1. Nonischemic dilated cardiomyopathy with normal coronary arteries on cath in 1996.  LVEF 15 to 20% on echo 02/2017 on Entresto, spironolactone, metoprolol and Lasix 2. PAF chads vas score is 2 on Xarelto and metoprolol 12.5 mg twice daily.  Remote AICD check 11/30/2018 normal device function all A. fib episodes less than 1 hour. Some  blood in his stool he thinks is from hemorrhoids. Will check bmet and CBC. 3. History of SVT status post ablation in the remote past 4. Complete heart block status post dual-chamber AICD 5. Essential hypertension BP on low side-no dizziness. 6. Dilated aortic root 35 mm on echo 02/2017 7. Morbid obesity exercise and weight loss recommended.  COVID-19 Education: The signs and symptoms of COVID-19 were discussed with the patient and how to seek care for testing (follow up with PCP or arrange E-visit).   The importance of social distancing was discussed today.  Time:   Today, I have spent 6:32 minutes with the patient with telehealth technology discussing the above problems.     Medication Adjustments/Labs and Tests Ordered: Current medicines are reviewed at length with the patient today.  Concerns regarding medicines are outlined above.   Tests Ordered: No orders of the defined types were placed in this encounter.   Medication Changes: No orders of the defined types were placed in this encounter.   Follow Up:  Either In Person or Virtual in 6 month(s) Dr. 03/2017.  Signed, [name], PA-C  01/15/2019 1:17 PM    [facility]

## 2019-01-15 ENCOUNTER — Other Ambulatory Visit: Payer: Self-pay

## 2019-01-15 ENCOUNTER — Encounter: Payer: Self-pay | Admitting: Physician Assistant

## 2019-01-15 ENCOUNTER — Telehealth (INDEPENDENT_AMBULATORY_CARE_PROVIDER_SITE_OTHER): Payer: Medicare Other | Admitting: Physician Assistant

## 2019-01-15 VITALS — BP 103/68 | HR 77 | Ht 71.0 in | Wt 366.0 lb

## 2019-01-15 DIAGNOSIS — Z7189 Other specified counseling: Secondary | ICD-10-CM

## 2019-01-15 DIAGNOSIS — I471 Supraventricular tachycardia, unspecified: Secondary | ICD-10-CM

## 2019-01-15 DIAGNOSIS — I420 Dilated cardiomyopathy: Secondary | ICD-10-CM

## 2019-01-15 DIAGNOSIS — I480 Paroxysmal atrial fibrillation: Secondary | ICD-10-CM

## 2019-01-15 DIAGNOSIS — Z7901 Long term (current) use of anticoagulants: Secondary | ICD-10-CM

## 2019-01-15 DIAGNOSIS — Z6841 Body Mass Index (BMI) 40.0 and over, adult: Secondary | ICD-10-CM

## 2019-01-15 DIAGNOSIS — I442 Atrioventricular block, complete: Secondary | ICD-10-CM

## 2019-01-15 DIAGNOSIS — I10 Essential (primary) hypertension: Secondary | ICD-10-CM

## 2019-01-15 DIAGNOSIS — I77810 Thoracic aortic ectasia: Secondary | ICD-10-CM

## 2019-01-15 DIAGNOSIS — I428 Other cardiomyopathies: Secondary | ICD-10-CM

## 2019-01-15 DIAGNOSIS — I77819 Aortic ectasia, unspecified site: Secondary | ICD-10-CM

## 2019-01-15 NOTE — Patient Instructions (Signed)
Medication Instructions:  No changes today *If you need a refill on your cardiac medications before your next appointment, please call your pharmacy*  Lab Work: CBC, BMET --patient coming to lab on Thursday 01/17/19  If you have labs (blood work) drawn today and your tests are completely normal, you will receive your results only by: Marland Kitchen MyChart Message (if you have MyChart) OR . A paper copy in the mail If you have any lab test that is abnormal or we need to change your treatment, we will call you to review the results.  Testing/Procedures: none  Follow-Up: At [facility], you and your health needs are our priority.  As part of our continuing mission to provide you with exceptional heart care, we have created designated Provider Care Teams.  These Care Teams include your primary Cardiologist (physician) and Advanced Practice Providers (APPs -  Physician Assistants and Nurse Practitioners) who all work together to provide you with the care you need, when you need it.  Your next appointment:   6 month(s)  The format for your next appointment:   Either In Person or Virtual  Provider:   [name], MD  Other Instructions I spoke with Mr. Weiand.  He make a lab appointment for 01/17/19.

## 2019-01-15 NOTE — Addendum Note (Signed)
Addended by: Lendon Ka on: 01/15/2019 02:05 PM   Modules accepted: Orders

## 2019-01-17 ENCOUNTER — Other Ambulatory Visit: Payer: Medicare Other | Admitting: *Deleted

## 2019-01-17 ENCOUNTER — Other Ambulatory Visit: Payer: Self-pay

## 2019-01-17 DIAGNOSIS — I428 Other cardiomyopathies: Secondary | ICD-10-CM

## 2019-01-17 DIAGNOSIS — I471 Supraventricular tachycardia: Secondary | ICD-10-CM

## 2019-01-17 DIAGNOSIS — I77810 Thoracic aortic ectasia: Secondary | ICD-10-CM

## 2019-01-17 DIAGNOSIS — I480 Paroxysmal atrial fibrillation: Secondary | ICD-10-CM

## 2019-01-17 DIAGNOSIS — I10 Essential (primary) hypertension: Secondary | ICD-10-CM

## 2019-01-17 DIAGNOSIS — I442 Atrioventricular block, complete: Secondary | ICD-10-CM

## 2019-01-18 LAB — BASIC METABOLIC PANEL
BUN/Creatinine Ratio: 7 — ABNORMAL LOW (ref 10–24)
BUN: 7 mg/dL — ABNORMAL LOW (ref 8–27)
CO2: 24 mmol/L (ref 20–29)
Calcium: 10.6 mg/dL — ABNORMAL HIGH (ref 8.6–10.2)
Chloride: 105 mmol/L (ref 96–106)
Creatinine, Ser: 1.02 mg/dL (ref 0.76–1.27)
GFR calc Af Amer: 90 mL/min/{1.73_m2} (ref >59)
GFR calc non Af Amer: 78 mL/min/{1.73_m2} (ref >59)
Glucose: 116 mg/dL — ABNORMAL HIGH (ref 65–99)
Potassium: 4.2 mmol/L (ref 3.5–5.2)
Sodium: 139 mmol/L (ref 134–144)

## 2019-01-18 LAB — CBC
Hematocrit: 40.7 % (ref 37.5–51.0)
Hemoglobin: 12.7 g/dL — ABNORMAL LOW (ref 13.0–17.7)
MCH: 26.1 pg — ABNORMAL LOW (ref 26.6–33.0)
MCHC: 31.2 g/dL — ABNORMAL LOW (ref 31.5–35.7)
MCV: 84 fL (ref 79–97)
Platelets: 335 10*3/uL (ref 150–450)
RBC: 4.87 x10E6/uL (ref 4.14–5.80)
RDW: 15.1 % (ref 11.6–15.4)
WBC: 9.3 10*3/uL (ref 3.4–10.8)

## 2019-03-01 ENCOUNTER — Ambulatory Visit (INDEPENDENT_AMBULATORY_CARE_PROVIDER_SITE_OTHER): Payer: Medicare Other | Admitting: *Deleted

## 2019-03-01 DIAGNOSIS — I428 Other cardiomyopathies: Secondary | ICD-10-CM | POA: Diagnosis not present

## 2019-03-01 LAB — CUP PACEART REMOTE DEVICE CHECK
Battery Remaining Longevity: 77 mo
Battery Remaining Percentage: 84 %
Battery Voltage: 3.01 V
Brady Statistic AP VP Percent: 5.7 %
Brady Statistic AP VS Percent: 1 %
Brady Statistic AS VP Percent: 94 %
Brady Statistic AS VS Percent: 1 %
Brady Statistic RA Percent Paced: 5.3 %
Brady Statistic RV Percent Paced: 99 %
Date Time Interrogation Session: 20210219020019
HighPow Impedance: 44 Ohm
HighPow Impedance: 44 Ohm
Implantable Lead Implant Date: 19961213
Implantable Lead Implant Date: 20111202
Implantable Lead Location: 753859
Implantable Lead Location: 753860
Implantable Lead Model: 5524
Implantable Lead Model: 7121
Implantable Pulse Generator Implant Date: 20200518
Lead Channel Impedance Value: 350 Ohm
Lead Channel Impedance Value: 480 Ohm
Lead Channel Pacing Threshold Amplitude: 0.75 V
Lead Channel Pacing Threshold Amplitude: 0.875 V
Lead Channel Pacing Threshold Pulse Width: 0.5 ms
Lead Channel Pacing Threshold Pulse Width: 0.5 ms
Lead Channel Sensing Intrinsic Amplitude: 2.4 mV
Lead Channel Sensing Intrinsic Amplitude: 7.3 mV
Lead Channel Setting Pacing Amplitude: 2 V
Lead Channel Setting Pacing Amplitude: 2 V
Lead Channel Setting Pacing Pulse Width: 0.5 ms
Lead Channel Setting Sensing Sensitivity: 0.5 mV
Pulse Gen Serial Number: 9829382

## 2019-03-01 NOTE — Progress Notes (Signed)
ICD Remote  

## 2019-05-31 ENCOUNTER — Ambulatory Visit (INDEPENDENT_AMBULATORY_CARE_PROVIDER_SITE_OTHER): Payer: Medicare Other | Admitting: *Deleted

## 2019-05-31 DIAGNOSIS — I480 Paroxysmal atrial fibrillation: Secondary | ICD-10-CM

## 2019-05-31 DIAGNOSIS — I471 Supraventricular tachycardia: Secondary | ICD-10-CM

## 2019-05-31 LAB — CUP PACEART REMOTE DEVICE CHECK
Battery Remaining Longevity: 74 mo
Battery Remaining Percentage: 81 %
Battery Voltage: 2.98 V
Brady Statistic AP VP Percent: 5.4 %
Brady Statistic AP VS Percent: 1 %
Brady Statistic AS VP Percent: 94 %
Brady Statistic AS VS Percent: 1 %
Brady Statistic RA Percent Paced: 5 %
Brady Statistic RV Percent Paced: 99 %
Date Time Interrogation Session: 20210521020019
HighPow Impedance: 43 Ohm
HighPow Impedance: 43 Ohm
Implantable Lead Implant Date: 19961213
Implantable Lead Implant Date: 20111202
Implantable Lead Location: 753859
Implantable Lead Location: 753860
Implantable Lead Model: 5524
Implantable Lead Model: 7121
Implantable Pulse Generator Implant Date: 20200518
Lead Channel Impedance Value: 340 Ohm
Lead Channel Impedance Value: 490 Ohm
Lead Channel Pacing Threshold Amplitude: 0.75 V
Lead Channel Pacing Threshold Amplitude: 0.875 V
Lead Channel Pacing Threshold Pulse Width: 0.5 ms
Lead Channel Pacing Threshold Pulse Width: 0.5 ms
Lead Channel Sensing Intrinsic Amplitude: 2.9 mV
Lead Channel Sensing Intrinsic Amplitude: 9.3 mV
Lead Channel Setting Pacing Amplitude: 2 V
Lead Channel Setting Pacing Amplitude: 2 V
Lead Channel Setting Pacing Pulse Width: 0.5 ms
Lead Channel Setting Sensing Sensitivity: 0.5 mV
Pulse Gen Serial Number: 9829382

## 2019-06-03 NOTE — Progress Notes (Signed)
Remote ICD transmission.   

## 2019-06-26 ENCOUNTER — Ambulatory Visit: Payer: Medicare Other | Admitting: Internal Medicine

## 2019-06-26 ENCOUNTER — Other Ambulatory Visit: Payer: Self-pay

## 2019-06-26 VITALS — BP 116/82 | HR 118 | Ht 71.0 in | Wt 363.0 lb

## 2019-06-26 DIAGNOSIS — I480 Paroxysmal atrial fibrillation: Secondary | ICD-10-CM

## 2019-06-26 DIAGNOSIS — Z95810 Presence of automatic (implantable) cardiac defibrillator: Secondary | ICD-10-CM

## 2019-06-26 DIAGNOSIS — I442 Atrioventricular block, complete: Secondary | ICD-10-CM

## 2019-06-26 DIAGNOSIS — I4901 Ventricular fibrillation: Secondary | ICD-10-CM | POA: Diagnosis not present

## 2019-06-26 DIAGNOSIS — I428 Other cardiomyopathies: Secondary | ICD-10-CM | POA: Diagnosis not present

## 2019-06-26 LAB — CUP PACEART INCLINIC DEVICE CHECK
Battery Remaining Longevity: 73 mo
Brady Statistic RA Percent Paced: 4.9 %
Brady Statistic RV Percent Paced: 99.42 %
Date Time Interrogation Session: 20210616143000
HighPow Impedance: 45.2292
Implantable Lead Implant Date: 19961213
Implantable Lead Implant Date: 20111202
Implantable Lead Location: 753859
Implantable Lead Location: 753860
Implantable Lead Model: 5524
Implantable Lead Model: 7121
Implantable Pulse Generator Implant Date: 20200518
Lead Channel Impedance Value: 337.5 Ohm
Lead Channel Impedance Value: 487.5 Ohm
Lead Channel Pacing Threshold Amplitude: 0.75 V
Lead Channel Pacing Threshold Amplitude: 0.875 V
Lead Channel Pacing Threshold Pulse Width: 0.5 ms
Lead Channel Pacing Threshold Pulse Width: 0.5 ms
Lead Channel Sensing Intrinsic Amplitude: 2.3 mV
Lead Channel Sensing Intrinsic Amplitude: 3.5 mV
Lead Channel Setting Pacing Amplitude: 2 V
Lead Channel Setting Pacing Amplitude: 2 V
Lead Channel Setting Pacing Pulse Width: 0.5 ms
Lead Channel Setting Sensing Sensitivity: 0.5 mV
Pulse Gen Serial Number: 9829382

## 2019-06-26 NOTE — Progress Notes (Signed)
HPI Mr. Muellner returns today for followup. He is a morbidly obese 64 yo man with CHB, s/p ICD insertion, non-ischemic CM, and HTN. He has gradually lost weight but slowly. He has gone back to the gym. He denies peripheral edema. He denies chest pain and his dyspnea remains class 2, despite his massive obesity.  Allergies  Allergen Reactions  . Penicillins Other (See Comments)    Dizziness Did it involve swelling of the face/tongue/throat, SOB, or low BP? Yes Did it involve sudden or severe rash/hives, skin peeling, or any reaction on the inside of your mouth or nose? No Did you need to seek medical attention at a hospital or doctor's office? Yes When did it last happen?childhood If all above answers are "NO", may proceed with cephalosporin use.      Current Outpatient Medications  Medication Sig Dispense Refill  . albuterol (PROVENTIL HFA;VENTOLIN HFA) 108 (90 BASE) MCG/ACT inhaler Inhale 2 puffs into the lungs every 4 (four) hours as needed for wheezing. For shortness of breath    . ENTRESTO 97-103 MG TAKE 1 TABLET BY MOUTH TWICE A DAY 180 tablet 0  . furosemide (LASIX) 40 MG tablet TAKE 1 TABLET BY MOUTH EVERY DAY 90 tablet 3  . metoprolol tartrate (LOPRESSOR) 25 MG tablet Take 0.5 tablets (12.5 mg total) by mouth 2 (two) times daily. 90 tablet 3  . OZEMPIC, 0.25 OR 0.5 MG/DOSE, 2 MG/1.5ML SOPN Inject 0.5 mg into the skin once a week.     . rivaroxaban (XARELTO) 20 MG TABS tablet Take 1 tablet (20 mg total) by mouth daily with supper. 30 tablet 11  . simvastatin (ZOCOR) 40 MG tablet Take 40 mg by mouth every evening.    Marland Kitchen spironolactone (ALDACTONE) 25 MG tablet TAKE 0.5 TABLETS (12.5 MG TOTAL) BY MOUTH DAILY. 45 tablet 2  . SYMBICORT 80-4.5 MCG/ACT inhaler Inhale 2 puffs into the lungs 2 (two) times a day.    . TRESIBA FLEXTOUCH 200 UNIT/ML SOPN Inject 70 Units into the skin every morning.      No current facility-administered medications for this visit.    Facility-Administered Medications Ordered in Other Visits  Medication Dose Route Frequency Provider Last Rate Last Admin  . technetium pyrophosphate Tc 82m injection 21.1 millicurie  21.1 millicurie Intravenous Once Lars Masson, MD         Past Medical History:  Diagnosis Date  . Asthma   . Automatic implantable cardioverter-defibrillator in situ   . Back pain   . Chronic systolic CHF (congestive heart failure), NYHA class 3 (HCC)   . Complete heart block [facility])    s/p PPM 1997 with upgrade to AICD 2011  . Depression   . Dilated aortic root (HCC)    76mm ascending aortic root  . DM (diabetes mellitus) (HCC)   . Glaucoma   . HTN (hypertension)   . Hyperlipidemia   . Nonischemic cardiomyopathy (HCC)    EF of 15-20% by echo 2015 and 20% by nuclear stress test 2017  . Obesity   . PAF (paroxysmal atrial fibrillation) (HCC)    13 minutes of PAF documented on ICD check 2018 - anticoagulated with Eliquis  . Sarcoid   . Sleep apnea 06-28-12   no cpap used-unable to tolerate  . SVT (supraventricular tachycardia) (HCC)    s/p ablation    ROS:   All systems reviewed and negative except as noted in the HPI.   Past Surgical History:  Procedure Laterality Date  .  COLONOSCOPY WITH PROPOFOL N/A 07/17/2012   Procedure: COLONOSCOPY WITH PROPOFOL;  Surgeon: Charolett Bumpers, MD;  Location: [facility] ENDOSCOPY;  Service: Endoscopy;  Laterality: N/A;  . HEMORRHOID SURGERY     early 20's  . ICD GENERATOR CHANGEOUT N/A 05/28/2018   Procedure: ICD GENERATOR CHANGEOUT;  Surgeon: Marinus Maw, MD;  Location: [facility] INVASIVE CV LAB;  Service: Cardiovascular;  Laterality: N/A;  . PACEMAKER INSERTION    . TONSILLECTOMY       Family History  Problem Relation Age of Onset  . Diabetes Mother   . Alzheimer's disease Mother   . CVA Father   . Heart failure Father   . Epilepsy Sister   . Diabetes Brother   . Diabetes Sister      Social History   Socioeconomic History  . Marital status:  Married    Spouse name: Not on file  . Number of children: Not on file  . Years of education: Not on file  . Highest education level: Not on file  Occupational History  . Not on file  Tobacco Use  . Smoking status: Never Smoker  . Smokeless tobacco: Never Used  Vaping Use  . Vaping Use: Never used  Substance and Sexual Activity  . Alcohol use: Yes    Comment: occasionally  . Drug use: No  . Sexual activity: Yes  Other Topics Concern  . Not on file  Social History Narrative  . Not on file   Social Determinants of Health   Financial Resource Strain:   . Difficulty of Paying Living Expenses:   Food Insecurity:   . Worried About Programme researcher, broadcasting/film/video in the Last Year:   . Barista in the Last Year:   Transportation Needs:   . Freight forwarder (Medical):   Marland Kitchen Lack of Transportation (Non-Medical):   Physical Activity:   . Days of Exercise per Week:   . Minutes of Exercise per Session:   Stress:   . Feeling of Stress :   Social Connections:   . Frequency of Communication with Friends and Family:   . Frequency of Social Gatherings with Friends and Family:   . Attends Religious Services:   . Active Member of Clubs or Organizations:   . Attends Banker Meetings:   Marland Kitchen Marital Status:   Intimate Partner Violence:   . Fear of Current or Ex-Partner:   . Emotionally Abused:   Marland Kitchen Physically Abused:   . Sexually Abused:      BP 116/82   Pulse (!) 118   Ht 5\' 11"  (1.803 m)   Wt (!) 363 lb (164.7 kg)   BMI 50.63 kg/m   Physical Exam:  Well appearing NAD HEENT: Unremarkable Neck:  No JVD, no thyromegally Lymphatics:  No adenopathy Back:  No CVA tenderness Lungs:  Clear with no wheezes HEART:  Regular rate rhythm, no murmurs, no rubs, no clicks Abd:  soft, positive bowel sounds, no organomegally, no rebound, no guarding Ext:  2 plus pulses, no edema, no cyanosis, no clubbing Skin:  No rashes no nodules Neuro:  CN II through XII intact, motor  grossly intact  EKG - nsr with ventricular pacing  DEVICE  Normal device function.  See PaceArt for details.   Assess/Plan: 1. Morbid obesity - he has lost 30 lbs in the last year. Hopefully he will continue.  2. chronic systolic heart failure - he has class 2 symptoms and has started back working out. He will continue  guideline directed medical therapy. 3. Dyslipidemia - he will continue his statin therapy. 4. Atrial tachycardia - he has had occaissional episodes lasting up to 5 minutes. He is asymptomatic.  Cristopher Peru, M.D.

## 2019-06-26 NOTE — Patient Instructions (Signed)

## 2019-08-30 ENCOUNTER — Ambulatory Visit (INDEPENDENT_AMBULATORY_CARE_PROVIDER_SITE_OTHER): Payer: Medicare Other | Admitting: *Deleted

## 2019-08-30 DIAGNOSIS — I428 Other cardiomyopathies: Secondary | ICD-10-CM | POA: Diagnosis not present

## 2019-08-30 LAB — CUP PACEART REMOTE DEVICE CHECK
Battery Remaining Longevity: 73 mo
Battery Remaining Percentage: 79 %
Battery Voltage: 2.98 V
Brady Statistic AP VP Percent: 6 %
Brady Statistic AP VS Percent: 1 %
Brady Statistic AS VP Percent: 93 %
Brady Statistic AS VS Percent: 1 %
Brady Statistic RA Percent Paced: 5.2 %
Brady Statistic RV Percent Paced: 99 %
Date Time Interrogation Session: 20210820023914
HighPow Impedance: 42 Ohm
HighPow Impedance: 42 Ohm
Implantable Lead Implant Date: 19961213
Implantable Lead Implant Date: 20111202
Implantable Lead Location: 753859
Implantable Lead Location: 753860
Implantable Lead Model: 5524
Implantable Lead Model: 7121
Implantable Pulse Generator Implant Date: 20200518
Lead Channel Impedance Value: 340 Ohm
Lead Channel Impedance Value: 490 Ohm
Lead Channel Pacing Threshold Amplitude: 0.75 V
Lead Channel Pacing Threshold Amplitude: 0.875 V
Lead Channel Pacing Threshold Pulse Width: 0.5 ms
Lead Channel Pacing Threshold Pulse Width: 0.5 ms
Lead Channel Sensing Intrinsic Amplitude: 2 mV
Lead Channel Sensing Intrinsic Amplitude: 7 mV
Lead Channel Setting Pacing Amplitude: 2 V
Lead Channel Setting Pacing Amplitude: 2 V
Lead Channel Setting Pacing Pulse Width: 0.5 ms
Lead Channel Setting Sensing Sensitivity: 0.5 mV
Pulse Gen Serial Number: 9829382

## 2019-09-02 NOTE — Progress Notes (Signed)
Remote ICD transmission.   

## 2019-10-29 ENCOUNTER — Other Ambulatory Visit: Payer: Self-pay

## 2019-10-29 MED ORDER — ENTRESTO 97-103 MG PO TABS: 1.0000 | ORAL_TABLET | Freq: Two times a day (BID) | ORAL | 0 refills | Status: DC

## 2019-11-29 ENCOUNTER — Ambulatory Visit (INDEPENDENT_AMBULATORY_CARE_PROVIDER_SITE_OTHER): Payer: Medicare Other

## 2019-11-29 DIAGNOSIS — I428 Other cardiomyopathies: Secondary | ICD-10-CM | POA: Diagnosis not present

## 2019-11-30 LAB — CUP PACEART REMOTE DEVICE CHECK
Battery Remaining Longevity: 70 mo
Battery Remaining Percentage: 76 %
Battery Voltage: 2.96 V
Brady Statistic AP VP Percent: 5.3 %
Brady Statistic AP VS Percent: 1 %
Brady Statistic AS VP Percent: 94 %
Brady Statistic AS VS Percent: 1 %
Brady Statistic RA Percent Paced: 4.7 %
Brady Statistic RV Percent Paced: 99 %
Date Time Interrogation Session: 20211120015025
HighPow Impedance: 42 Ohm
HighPow Impedance: 42 Ohm
Implantable Lead Implant Date: 19961213
Implantable Lead Implant Date: 20111202
Implantable Lead Location: 753859
Implantable Lead Location: 753860
Implantable Lead Model: 5524
Implantable Lead Model: 7121
Implantable Pulse Generator Implant Date: 20200518
Lead Channel Impedance Value: 330 Ohm
Lead Channel Impedance Value: 480 Ohm
Lead Channel Pacing Threshold Amplitude: 0.75 V
Lead Channel Pacing Threshold Amplitude: 0.75 V
Lead Channel Pacing Threshold Pulse Width: 0.5 ms
Lead Channel Pacing Threshold Pulse Width: 0.5 ms
Lead Channel Sensing Intrinsic Amplitude: 11.1 mV
Lead Channel Sensing Intrinsic Amplitude: 2 mV
Lead Channel Setting Pacing Amplitude: 2 V
Lead Channel Setting Pacing Amplitude: 2 V
Lead Channel Setting Pacing Pulse Width: 0.5 ms
Lead Channel Setting Sensing Sensitivity: 0.5 mV
Pulse Gen Serial Number: 9829382

## 2019-12-02 NOTE — Progress Notes (Signed)
Remote ICD transmission.   

## 2019-12-18 ENCOUNTER — Encounter: Payer: Self-pay | Admitting: Internal Medicine

## 2019-12-18 ENCOUNTER — Other Ambulatory Visit: Payer: Self-pay | Admitting: Cardiology

## 2019-12-18 NOTE — Telephone Encounter (Signed)
error 

## 2020-02-04 ENCOUNTER — Other Ambulatory Visit: Payer: Self-pay

## 2020-02-05 MED ORDER — ENTRESTO 97-103 MG PO TABS
1.0000 | ORAL_TABLET | Freq: Two times a day (BID) | ORAL | 0 refills | Status: DC
Start: 2020-02-05 — End: 2020-02-18

## 2020-02-06 DIAGNOSIS — E1142 Type 2 diabetes mellitus with diabetic polyneuropathy: Secondary | ICD-10-CM | POA: Diagnosis not present

## 2020-02-06 DIAGNOSIS — I10 Essential (primary) hypertension: Secondary | ICD-10-CM | POA: Diagnosis not present

## 2020-02-06 DIAGNOSIS — E782 Mixed hyperlipidemia: Secondary | ICD-10-CM | POA: Diagnosis not present

## 2020-02-06 DIAGNOSIS — I5022 Chronic systolic (congestive) heart failure: Secondary | ICD-10-CM | POA: Diagnosis not present

## 2020-02-06 DIAGNOSIS — I4891 Unspecified atrial fibrillation: Secondary | ICD-10-CM | POA: Diagnosis not present

## 2020-02-06 DIAGNOSIS — J45909 Unspecified asthma, uncomplicated: Secondary | ICD-10-CM | POA: Diagnosis not present

## 2020-02-06 DIAGNOSIS — E1165 Type 2 diabetes mellitus with hyperglycemia: Secondary | ICD-10-CM | POA: Diagnosis not present

## 2020-02-06 DIAGNOSIS — H409 Unspecified glaucoma: Secondary | ICD-10-CM | POA: Diagnosis not present

## 2020-02-15 ENCOUNTER — Other Ambulatory Visit: Payer: Self-pay | Admitting: Internal Medicine

## 2020-02-19 ENCOUNTER — Ambulatory Visit: Payer: Medicare Other | Admitting: Cardiology

## 2020-02-28 ENCOUNTER — Ambulatory Visit (INDEPENDENT_AMBULATORY_CARE_PROVIDER_SITE_OTHER): Payer: Medicare Other

## 2020-02-28 DIAGNOSIS — I442 Atrioventricular block, complete: Secondary | ICD-10-CM

## 2020-02-28 DIAGNOSIS — I5022 Chronic systolic (congestive) heart failure: Secondary | ICD-10-CM

## 2020-02-28 LAB — CUP PACEART REMOTE DEVICE CHECK
Battery Remaining Longevity: 66 mo
Battery Remaining Percentage: 73 %
Battery Voltage: 2.96 V
Brady Statistic AP VP Percent: 4.8 %
Brady Statistic AP VS Percent: 1 %
Brady Statistic AS VP Percent: 94 %
Brady Statistic AS VS Percent: 1.2 %
Brady Statistic RA Percent Paced: 4.1 %
Brady Statistic RV Percent Paced: 98 %
Date Time Interrogation Session: 20220218024554
HighPow Impedance: 42 Ohm
HighPow Impedance: 42 Ohm
Implantable Lead Implant Date: 19961213
Implantable Lead Implant Date: 20111202
Implantable Lead Location: 753859
Implantable Lead Location: 753860
Implantable Lead Model: 5524
Implantable Lead Model: 7121
Implantable Pulse Generator Implant Date: 20200518
Lead Channel Impedance Value: 330 Ohm
Lead Channel Impedance Value: 480 Ohm
Lead Channel Pacing Threshold Amplitude: 0.75 V
Lead Channel Pacing Threshold Amplitude: 0.75 V
Lead Channel Pacing Threshold Pulse Width: 0.5 ms
Lead Channel Pacing Threshold Pulse Width: 0.5 ms
Lead Channel Sensing Intrinsic Amplitude: 1.6 mV
Lead Channel Sensing Intrinsic Amplitude: 11.5 mV
Lead Channel Setting Pacing Amplitude: 2 V
Lead Channel Setting Pacing Amplitude: 2 V
Lead Channel Setting Pacing Pulse Width: 0.5 ms
Lead Channel Setting Sensing Sensitivity: 0.5 mV
Pulse Gen Serial Number: 9829382

## 2020-03-03 NOTE — Progress Notes (Signed)
Remote ICD transmission.   

## 2020-03-11 ENCOUNTER — Encounter: Payer: Self-pay | Admitting: Cardiology

## 2020-03-11 ENCOUNTER — Other Ambulatory Visit: Payer: Self-pay

## 2020-03-11 ENCOUNTER — Ambulatory Visit: Payer: Medicare Other | Admitting: Cardiology

## 2020-03-11 VITALS — BP 122/78 | HR 94 | Ht 71.0 in | Wt 369.0 lb

## 2020-03-11 DIAGNOSIS — I5022 Chronic systolic (congestive) heart failure: Secondary | ICD-10-CM

## 2020-03-11 DIAGNOSIS — I10 Essential (primary) hypertension: Secondary | ICD-10-CM

## 2020-03-11 DIAGNOSIS — I428 Other cardiomyopathies: Secondary | ICD-10-CM | POA: Diagnosis not present

## 2020-03-11 DIAGNOSIS — I480 Paroxysmal atrial fibrillation: Secondary | ICD-10-CM | POA: Diagnosis not present

## 2020-03-11 DIAGNOSIS — I77810 Thoracic aortic ectasia: Secondary | ICD-10-CM | POA: Diagnosis not present

## 2020-03-11 DIAGNOSIS — I442 Atrioventricular block, complete: Secondary | ICD-10-CM

## 2020-03-11 DIAGNOSIS — I471 Supraventricular tachycardia: Secondary | ICD-10-CM

## 2020-03-11 MED ORDER — SPIRONOLACTONE 25 MG PO TABS: 25.0000 mg | ORAL_TABLET | Freq: Every day | ORAL | 3 refills | Status: DC

## 2020-03-11 MED ORDER — METOPROLOL SUCCINATE ER 50 MG PO TB24: 50.0000 mg | ORAL_TABLET | Freq: Every day | ORAL | 3 refills | Status: DC

## 2020-03-11 NOTE — Progress Notes (Signed)
Cardiology Office Note:    Date:  03/11/2020   ID:  [name], DOB [birth year], MRN [id number]  PCP:  Georgann Housekeeper, MD  Cardiologist:  [name], MD    Referring MD: Georgann Housekeeper, MD   Chief Complaint  Patient presents with  . Congestive Heart Failure  . Cardiomyopathy  . Hypertension  . Atrial Fibrillation    History of Present Illness:    [name] is a 65 y.o. male with a hx of nonischemic DCM (normal coronary arteries by cath 1996), Sarcoidosis, chronic systolic CHF, HTN, CHB s/p dual chamber AICD and SVT s/p ablation.    He is here today for followup and is doing well.  He has chronic DOE related to morbid obesity that seems stable.  He also notices that his abdomen feels tight at times. He denies any chest pain or pressure,  PND, orthopnea, LE edema, dizziness, palpitations or syncope. He is compliant with his meds and is tolerating meds with no SE.    Past Medical History:  Diagnosis Date  . Asthma   . Automatic implantable cardioverter-defibrillator in situ   . Back pain   . Chronic systolic CHF (congestive heart failure), NYHA class 3 (HCC)   . Complete heart block [facility])    s/p PPM 1997 with upgrade to AICD 2011  . Depression   . Dilated aortic root (HCC)    79mm ascending aortic root  . DM (diabetes mellitus) (HCC)   . Glaucoma   . HTN (hypertension)   . Hyperlipidemia   . Nonischemic cardiomyopathy (HCC)    EF of 15-20% by echo 2015 and 20% by nuclear stress test 2017  . Obesity   . PAF (paroxysmal atrial fibrillation) (HCC)    13 minutes of PAF documented on ICD check 2018 - anticoagulated with Eliquis  . Sarcoid   . Sleep apnea 06-28-12   no cpap used-unable to tolerate  . SVT (supraventricular tachycardia) (HCC)    s/p ablation    Past Surgical History:  Procedure Laterality Date  . COLONOSCOPY WITH PROPOFOL N/A 07/17/2012   Procedure: COLONOSCOPY WITH PROPOFOL;  Surgeon: Charolett Bumpers, MD;  Location: [facility] ENDOSCOPY;  Service:  Endoscopy;  Laterality: N/A;  . HEMORRHOID SURGERY     early 20's  . ICD GENERATOR CHANGEOUT N/A 05/28/2018   Procedure: ICD GENERATOR CHANGEOUT;  Surgeon: Marinus Maw, MD;  Location: [facility] INVASIVE CV LAB;  Service: Cardiovascular;  Laterality: N/A;  . PACEMAKER INSERTION    . TONSILLECTOMY      Current Medications: Current Meds  Medication Sig  . albuterol (PROVENTIL HFA;VENTOLIN HFA) 108 (90 BASE) MCG/ACT inhaler Inhale 2 puffs into the lungs every 4 (four) hours as needed for wheezing. For shortness of breath  . furosemide (LASIX) 40 MG tablet TAKE 1 TABLET BY MOUTH EVERY DAY  . metoprolol tartrate (LOPRESSOR) 25 MG tablet Take 0.5 tablets (12.5 mg total) by mouth daily.  Marland Kitchen OZEMPIC, 0.25 OR 0.5 MG/DOSE, 2 MG/1.5ML SOPN Inject 0.5 mg into the skin once a week.   . rivaroxaban (XARELTO) 20 MG TABS tablet Take 1 tablet (20 mg total) by mouth daily with supper.  . sacubitril-valsartan (ENTRESTO) 97-103 MG Take 1 tablet by mouth 2 (two) times daily.  . simvastatin (ZOCOR) 40 MG tablet Take 40 mg by mouth every evening.  Marland Kitchen spironolactone (ALDACTONE) 25 MG tablet TAKE 0.5 TABLETS (12.5 MG TOTAL) BY MOUTH DAILY.  . SYMBICORT 80-4.5 MCG/ACT inhaler Inhale 2 puffs into the lungs 2 (two)  times a day.  . TRESIBA FLEXTOUCH 200 UNIT/ML SOPN Inject 70 Units into the skin every morning.      Allergies:   Penicillins   Social History   Socioeconomic History  . Marital status: Married    Spouse name: Not on file  . Number of children: Not on file  . Years of education: Not on file  . Highest education level: Not on file  Occupational History  . Not on file  Tobacco Use  . Smoking status: Never Smoker  . Smokeless tobacco: Never Used  Vaping Use  . Vaping Use: Never used  Substance and Sexual Activity  . Alcohol use: Yes    Comment: occasionally  . Drug use: No  . Sexual activity: Yes  Other Topics Concern  . Not on file  Social History Narrative  . Not on file   Social  Determinants of Health   Financial Resource Strain: Not on file  Food Insecurity: Not on file  Transportation Needs: Not on file  Physical Activity: Not on file  Stress: Not on file  Social Connections: Not on file     Family History: The patient's family history includes Alzheimer's disease in his mother; CVA in his father; Diabetes in his brother, mother, and sister; Epilepsy in his sister; Heart failure in his father.  ROS:   Please see the history of present illness.    ROS  All other systems reviewed and negative.   EKGs/Labs/Other Studies Reviewed:    The following studies were reviewed today: none  EKG:  EKG is ordered today and showed NSR at 90bpm with V pacing  Recent Labs: No results found for requested labs within last 8760 hours.   Recent Lipid Panel    Component Value Date/Time   CHOL  09/22/2009 0330    134        ATP III CLASSIFICATION:  <200     mg/dL   Desirable  500-938  mg/dL   Borderline High  >=182    mg/dL   High          TRIG 993 09/22/2009 0330   HDL 47 09/22/2009 0330   CHOLHDL 2.9 09/22/2009 0330   VLDL 25 09/22/2009 0330   LDLCALC  09/22/2009 0330    62        Total Cholesterol/HDL:CHD Risk Coronary Heart Disease Risk Table                     Men   Women  1/2 Average Risk   3.4   3.3  Average Risk       5.0   4.4  2 X Average Risk   9.6   7.1  3 X Average Risk  23.4   11.0        Use the calculated Patient Ratio above and the CHD Risk Table to determine the patient's CHD Risk.        ATP III CLASSIFICATION (LDL):  <100     mg/dL   Optimal  716-967  mg/dL   Near or Above                    Optimal  130-159  mg/dL   Borderline  893-810  mg/dL   High  >175     mg/dL   Very High    Physical Exam:    VS:  BP 122/78   Pulse 94   Ht 5\' 11"  (1.803 m)   Wt )  369 lb (167.4 kg)   SpO2 99%   BMI 51.47 kg/m     Wt Readings from Last 3 Encounters:  03/11/20 (!) 369 lb (167.4 kg)  06/26/19 (!) 363 lb (164.7 kg)  01/15/19 (!)  366 lb (166 kg)     GEN: Well nourished, well developed in no acute distress HEENT: Normal NECK: No JVD; No carotid bruits LYMPHATICS: No lymphadenopathy CARDIAC:RRR, no murmurs, rubs, gallops RESPIRATORY:  Clear to auscultation without rales, wheezing or rhonchi  ABDOMEN: Soft, non-tender, non-distended MUSCULOSKELETAL:  No edema; No deformity  SKIN: Warm and dry NEUROLOGIC:  Alert and oriented x 3 PSYCHIATRIC:  Normal affect    ASSESSMENT:    1. Chronic systolic heart failure (HCC)   2. Nonischemic cardiomyopathy (HCC)   3. Primary hypertension   4. Complete heart block (HCC)   5. SVT (supraventricular tachycardia) (HCC)   6. PAF (paroxysmal atrial fibrillation) (HCC)   7. Dilated aortic root (HCC)   8. Morbid obesity (HCC)    PLAN:    In order of problems listed above:  1.  Chronic systolic CHF  -he does not appear volume overloaded on exam today -his weight is stable -NYHA class IIa -continue Entresto 97-103mg  BID, lasix 40mg  daily -change short acting lopressor to Toprol XL and increase to 50mg  daily for better HR control -increase spiro to 25mg  daily   -BMET in 1 week as well as PharmD followup for uptitration of HF meds -2D echo 02/2017 with EF 15-20%  2.  NIDCM -normal coronary arteries were noted at time of cath and work-up of his cardiomyopathy.   -His last echo in February 2019 showed EF 15 to 20%.   -He is status post dual-chamber AICD.   -PYP scan equivocal  for amyloid  -urine and protein electrophoresis were normal  3.  HTN  -BP is well controlled on exam today -He is following a low sodium diet.   -continue Entresto and increase spiro to 25mg  daily -change lopressor to Toprol XL and increase to 50mg  daily as above in #1 for better HR control -SCr stable at 1.02 and K+ 4.5  4.  CHB   -he is status post dual-chamber AICD and is followed in device clinic.  5.  SVT  -he is status post SVT ablation remotely.  -he denies any  palpitations -Continue BB  6.  PAF  -he was noted on ICD check in 2018 to have PAF. -His CHADS2VASC score is 2 and will remain on Xarelto 20mg  daily.   -he denies any palpitations and no bleeding problems on DOAC -He will continue on BB but change to Toprol XL50mg  daily for better HR control -His last creatinine stable at 1.02 and Hbg 11 in Oct 2021  7.  Dilated aortic root  -2D echo 03/01/2017 showed Aortic root at 35 mm.   8.  Morbid Obesity -I have encouraged him to get into a routine exercise program and cut back on carbs and portions.    Medication Adjustments/Labs and Tests Ordered: Current medicines are reviewed at length with the patient today.  Concerns regarding medicines are outlined above.  No orders of the defined types were placed in this encounter.  No orders of the defined types were placed in this encounter.   Signed, , MD  03/11/2020 3:40 PM    [facility]

## 2020-03-11 NOTE — Patient Instructions (Signed)
Medication Instructions:  Your physician has recommended you make the following change in your medication: 1) STOP taking Lopressor 2) START taking Toprol XL 50 mg daily  3) INCREASE spironolactone to 25 mg daily  *If you need a refill on your cardiac medications before your next appointment, please call your pharmacy*   Lab Work: BMET in one week  If you have labs (blood work) drawn today and your tests are completely normal, you will receive your results only by: Marland Kitchen MyChart Message (if you have MyChart) OR . A paper copy in the mail If you have any lab test that is abnormal or we need to change your treatment, we will call you to review the results.  Follow-Up: At [facility], you and your health needs are our priority.  As part of our continuing mission to provide you with exceptional heart care, we have created designated Provider Care Teams.  These Care Teams include your primary Cardiologist (physician) and Advanced Practice Providers (APPs -  Physician Assistants and Nurse Practitioners) who all work together to provide you with the care you need, when you need it.  Your next appointment:   3 month(s)  The format for your next appointment:   In Person  Provider:   You may see [name], MD  or one of the following Advanced Practice Providers on your designated Care Team:    Ronie Spies, PA-C  Jacolyn Reedy, PA-C  Other Instructions You have been referred to see our Pharmacist in the Hypertension clinic in one week.

## 2020-03-19 ENCOUNTER — Other Ambulatory Visit: Payer: Self-pay

## 2020-03-19 ENCOUNTER — Other Ambulatory Visit: Payer: Medicare Other | Admitting: *Deleted

## 2020-03-19 ENCOUNTER — Ambulatory Visit (INDEPENDENT_AMBULATORY_CARE_PROVIDER_SITE_OTHER): Payer: Medicare Other | Admitting: Pharmacist

## 2020-03-19 VITALS — BP 110/65 | HR 80

## 2020-03-19 DIAGNOSIS — I10 Essential (primary) hypertension: Secondary | ICD-10-CM | POA: Diagnosis not present

## 2020-03-19 DIAGNOSIS — I5022 Chronic systolic (congestive) heart failure: Secondary | ICD-10-CM

## 2020-03-19 DIAGNOSIS — I428 Other cardiomyopathies: Secondary | ICD-10-CM | POA: Diagnosis not present

## 2020-03-19 NOTE — Patient Instructions (Addendum)
It was nice to meet you!  Please continue Entresto 97/103mg  twice a day, Toprol XL 50mg  daily, spironolactone 25mg  daily and furosemide 40mg  daily  Call me of you decide you want to increase metoprolol.  [phone]  Try to get some aerobic (walking) exercise

## 2020-03-19 NOTE — Progress Notes (Signed)
Patient ID: [name]                 DOB: Feb 21, 1955                      MRN: [id number]     HPI: [name] is a 65 y.o. male referred by [name] to pharmacy clinic for HF medication management. PMH is significant for nonischemic DCM (normal coronary arteries by cath 1996), Sarcoidosis, chronic systolic CHF, HTN, CHB s/p dual chamber AICD and SVT s/p ablation. Most recent LVEF 15-20 on 02/2017.  Today he presents to pharmacy clinic for further medication titration. At last visit with MD metoprolol tartrate was changed to succinate and spironolactone was increased to 25mg  daily. Symptomatically, she is feeling good, no dizziness, lightheadedness, and little fatigue. No chest pain or palpitations. Feels SOB after he eats or when he walks far distances. Able to complete all ADLs. Activity level low. He does check his weight at home (normal range 364 lbs). No LEE, PND, or orthopnea. Appetite has been good. He adheres to a low-salt diet. Does exercises on his "total gym" but overall is not very active.   States when he took his first dose of Toprol XL he felt lightheaded and itchy, but it hasn't happened since. Does not like taking a lot of medication. Researches all the medications he takes. Concerned about erectile dysfunction.  Current CHF meds: Entresto 97/103mg  twice a day, Toprol XL 50mg  daily, spironolactone 25mg  daily, furosemide 40mg  daily Previously tried:  BP goal: <130/80  Family History: The patient's family history includes Alzheimer's disease in his mother; CVA in his father; Diabetes in his brother, mother, and sister; Epilepsy in his sister; Heart failure in his father.  Social History: Cigar once every 3 months, beer or two every so often  Diet: not discussed  Exercise: total gym  Home BP readings: 117/78  Wt Readings from Last 3 Encounters:  03/11/20 (!) 369 lb (167.4 kg)  06/26/19 (!) 363 lb (164.7 kg)  01/15/19 (!) 366 lb (166 kg)   BP Readings from  Last 3 Encounters:  03/11/20 122/78  06/26/19 116/82  01/15/19 103/68   Pulse Readings from Last 3 Encounters:  03/11/20 94  06/26/19 (!) 118  01/15/19 77    Renal function: CrCl cannot be calculated (Patient's most recent lab result is older than the maximum 21 days allowed.).  Past Medical History:  Diagnosis Date  . Asthma   . Automatic implantable cardioverter-defibrillator in situ   . Back pain   . Chronic systolic CHF (congestive heart failure), NYHA class 3 (HCC)   . Complete heart block [facility])    s/p PPM 1997 with upgrade to AICD 2011  . Depression   . Dilated aortic root (HCC)    73mm ascending aortic root  . DM (diabetes mellitus) (HCC)   . Glaucoma   . HTN (hypertension)   . Hyperlipidemia   . Nonischemic cardiomyopathy (HCC)    EF of 15-20% by echo 2015 and 20% by nuclear stress test 2017  . Obesity   . PAF (paroxysmal atrial fibrillation) (HCC)    13 minutes of PAF documented on ICD check 2018 - anticoagulated with Eliquis  . Sarcoid   . Sleep apnea 06-28-12   no cpap used-unable to tolerate  . SVT (supraventricular tachycardia) (HCC)    s/p ablation    Current Outpatient Medications on File Prior to Visit  Medication Sig Dispense Refill  . albuterol (  PROVENTIL HFA;VENTOLIN HFA) 108 (90 BASE) MCG/ACT inhaler Inhale 2 puffs into the lungs every 4 (four) hours as needed for wheezing. For shortness of breath    . furosemide (LASIX) 40 MG tablet TAKE 1 TABLET BY MOUTH EVERY DAY 90 tablet 3  . metoprolol succinate (TOPROL-XL) 50 MG 24 hr tablet Take 1 tablet (50 mg total) by mouth daily. Take with or immediately following a meal. 90 tablet 3  . OZEMPIC, 0.25 OR 0.5 MG/DOSE, 2 MG/1.5ML SOPN Inject 0.5 mg into the skin once a week.     . rivaroxaban (XARELTO) 20 MG TABS tablet Take 1 tablet (20 mg total) by mouth daily with supper. 30 tablet 11  . sacubitril-valsartan (ENTRESTO) 97-103 MG Take 1 tablet by mouth 2 (two) times daily. 180 tablet 1  . simvastatin  (ZOCOR) 40 MG tablet Take 40 mg by mouth every evening.    Marland Kitchen spironolactone (ALDACTONE) 25 MG tablet Take 1 tablet (25 mg total) by mouth daily. 90 tablet 3  . SYMBICORT 80-4.5 MCG/ACT inhaler Inhale 2 puffs into the lungs 2 (two) times a day.    . TRESIBA FLEXTOUCH 200 UNIT/ML SOPN Inject 70 Units into the skin every morning.      Current Facility-Administered Medications on File Prior to Visit  Medication Dose Route Frequency Provider Last Rate Last Admin  . technetium pyrophosphate Tc 33m injection 21.1 millicurie  21.1 millicurie Intravenous Once Lars Masson, MD        Allergies  Allergen Reactions  . Penicillins Other (See Comments)    Dizziness Did it involve swelling of the face/tongue/throat, SOB, or low BP? Yes Did it involve sudden or severe rash/hives, skin peeling, or any reaction on the inside of your mouth or nose? No Did you need to seek medical attention at a hospital or doctor's office? Yes When did it last happen?childhood If all above answers are "NO", may proceed with cephalosporin use.      Assessment/Plan:  1. CHF - Blood pressure in clinic today at goal of <130/80. He is on target dose Entresto and spironolactone. HR has room at 80 BPM. Patient not interested in increasing medications. We discussed the benefits of target doses (or as close as we can get) and also the benefits of adding an SGLT2 inhibitor. Patient concerned about ED with metoprolol. Not interested in increasing dose at this time. Patient states he will check his BP and his HR at home for the next 2 weeks. If he feels his HR is too high, he will call me and would consider increasing metoprolol. I agreed with the plan, however reminded him that when treating CHF its not about the HR being too high, its more about being on the max tolerated dose. I respect the patient's decision and if he decides he wants to make changes he has my card to call me. BMP after spironolactone increase  pending.  Thank you,  Olene Floss, Pharm.D, BCPS, CPP [facility]  [address] [place], [place] [address]  Phone: [phone]; Fax: [phone]

## 2020-03-20 LAB — BASIC METABOLIC PANEL
BUN/Creatinine Ratio: 9 — ABNORMAL LOW (ref 10–24)
BUN: 9 mg/dL (ref 8–27)
CO2: 22 mmol/L (ref 20–29)
Calcium: 10.3 mg/dL — ABNORMAL HIGH (ref 8.6–10.2)
Chloride: 107 mmol/L — ABNORMAL HIGH (ref 96–106)
Creatinine, Ser: 0.98 mg/dL (ref 0.76–1.27)
Glucose: 190 mg/dL — ABNORMAL HIGH (ref 65–99)
Potassium: 4.1 mmol/L (ref 3.5–5.2)
Sodium: 141 mmol/L (ref 134–144)
eGFR: 86 mL/min/{1.73_m2} (ref >59)

## 2020-04-23 DIAGNOSIS — I700 Atherosclerosis of aorta: Secondary | ICD-10-CM | POA: Diagnosis not present

## 2020-04-23 DIAGNOSIS — Z95810 Presence of automatic (implantable) cardiac defibrillator: Secondary | ICD-10-CM | POA: Diagnosis not present

## 2020-04-23 DIAGNOSIS — I10 Essential (primary) hypertension: Secondary | ICD-10-CM | POA: Diagnosis not present

## 2020-04-23 DIAGNOSIS — E782 Mixed hyperlipidemia: Secondary | ICD-10-CM | POA: Diagnosis not present

## 2020-04-23 DIAGNOSIS — D6869 Other thrombophilia: Secondary | ICD-10-CM | POA: Diagnosis not present

## 2020-04-23 DIAGNOSIS — G4733 Obstructive sleep apnea (adult) (pediatric): Secondary | ICD-10-CM | POA: Diagnosis not present

## 2020-04-23 DIAGNOSIS — I4891 Unspecified atrial fibrillation: Secondary | ICD-10-CM | POA: Diagnosis not present

## 2020-04-23 DIAGNOSIS — E1142 Type 2 diabetes mellitus with diabetic polyneuropathy: Secondary | ICD-10-CM | POA: Diagnosis not present

## 2020-04-23 DIAGNOSIS — I5022 Chronic systolic (congestive) heart failure: Secondary | ICD-10-CM | POA: Diagnosis not present

## 2020-04-30 DIAGNOSIS — H40013 Open angle with borderline findings, low risk, bilateral: Secondary | ICD-10-CM | POA: Diagnosis not present

## 2020-04-30 DIAGNOSIS — H26492 Other secondary cataract, left eye: Secondary | ICD-10-CM | POA: Diagnosis not present

## 2020-04-30 DIAGNOSIS — Z961 Presence of intraocular lens: Secondary | ICD-10-CM | POA: Diagnosis not present

## 2020-04-30 DIAGNOSIS — E119 Type 2 diabetes mellitus without complications: Secondary | ICD-10-CM | POA: Diagnosis not present

## 2020-04-30 DIAGNOSIS — H16223 Keratoconjunctivitis sicca, not specified as Sjogren's, bilateral: Secondary | ICD-10-CM | POA: Diagnosis not present

## 2020-04-30 DIAGNOSIS — H35033 Hypertensive retinopathy, bilateral: Secondary | ICD-10-CM | POA: Diagnosis not present

## 2020-05-29 ENCOUNTER — Ambulatory Visit (INDEPENDENT_AMBULATORY_CARE_PROVIDER_SITE_OTHER): Payer: Medicare Other

## 2020-05-29 DIAGNOSIS — I5022 Chronic systolic (congestive) heart failure: Secondary | ICD-10-CM

## 2020-05-29 DIAGNOSIS — I428 Other cardiomyopathies: Secondary | ICD-10-CM

## 2020-05-29 LAB — CUP PACEART REMOTE DEVICE CHECK
Battery Remaining Longevity: 65 mo
Battery Remaining Percentage: 70 %
Battery Voltage: 2.96 V
Brady Statistic AP VP Percent: 4.4 %
Brady Statistic AP VS Percent: 1 %
Brady Statistic AS VP Percent: 93 %
Brady Statistic AS VS Percent: 1.6 %
Brady Statistic RA Percent Paced: 3.8 %
Brady Statistic RV Percent Paced: 98 %
Date Time Interrogation Session: 20220520020022
HighPow Impedance: 43 Ohm
HighPow Impedance: 43 Ohm
Implantable Lead Implant Date: 19961213
Implantable Lead Implant Date: 20111202
Implantable Lead Location: 753859
Implantable Lead Location: 753860
Implantable Lead Model: 5524
Implantable Lead Model: 7121
Implantable Pulse Generator Implant Date: 20200518
Lead Channel Impedance Value: 330 Ohm
Lead Channel Impedance Value: 480 Ohm
Lead Channel Pacing Threshold Amplitude: 0.75 V
Lead Channel Pacing Threshold Amplitude: 0.75 V
Lead Channel Pacing Threshold Pulse Width: 0.5 ms
Lead Channel Pacing Threshold Pulse Width: 0.5 ms
Lead Channel Sensing Intrinsic Amplitude: 10.3 mV
Lead Channel Sensing Intrinsic Amplitude: 2 mV
Lead Channel Setting Pacing Amplitude: 2 V
Lead Channel Setting Pacing Amplitude: 2 V
Lead Channel Setting Pacing Pulse Width: 0.5 ms
Lead Channel Setting Sensing Sensitivity: 0.5 mV
Pulse Gen Serial Number: 9829382

## 2020-06-05 DIAGNOSIS — I4891 Unspecified atrial fibrillation: Secondary | ICD-10-CM | POA: Diagnosis not present

## 2020-06-05 DIAGNOSIS — E1142 Type 2 diabetes mellitus with diabetic polyneuropathy: Secondary | ICD-10-CM | POA: Diagnosis not present

## 2020-06-05 DIAGNOSIS — E1165 Type 2 diabetes mellitus with hyperglycemia: Secondary | ICD-10-CM | POA: Diagnosis not present

## 2020-06-05 DIAGNOSIS — I10 Essential (primary) hypertension: Secondary | ICD-10-CM | POA: Diagnosis not present

## 2020-06-05 DIAGNOSIS — J45909 Unspecified asthma, uncomplicated: Secondary | ICD-10-CM | POA: Diagnosis not present

## 2020-06-05 DIAGNOSIS — E782 Mixed hyperlipidemia: Secondary | ICD-10-CM | POA: Diagnosis not present

## 2020-06-05 DIAGNOSIS — H409 Unspecified glaucoma: Secondary | ICD-10-CM | POA: Diagnosis not present

## 2020-06-05 DIAGNOSIS — I5022 Chronic systolic (congestive) heart failure: Secondary | ICD-10-CM | POA: Diagnosis not present

## 2020-06-15 NOTE — Progress Notes (Signed)
Remote ICD transmission.   

## 2020-06-18 ENCOUNTER — Other Ambulatory Visit: Payer: Self-pay

## 2020-06-18 ENCOUNTER — Encounter: Payer: Self-pay | Admitting: Cardiology

## 2020-06-18 ENCOUNTER — Ambulatory Visit: Payer: Medicare Other | Admitting: Cardiology

## 2020-06-18 VITALS — BP 100/64 | HR 81 | Ht 71.0 in | Wt 358.4 lb

## 2020-06-18 DIAGNOSIS — I480 Paroxysmal atrial fibrillation: Secondary | ICD-10-CM | POA: Diagnosis not present

## 2020-06-18 DIAGNOSIS — I5022 Chronic systolic (congestive) heart failure: Secondary | ICD-10-CM

## 2020-06-18 DIAGNOSIS — I10 Essential (primary) hypertension: Secondary | ICD-10-CM

## 2020-06-18 DIAGNOSIS — I442 Atrioventricular block, complete: Secondary | ICD-10-CM | POA: Diagnosis not present

## 2020-06-18 DIAGNOSIS — I428 Other cardiomyopathies: Secondary | ICD-10-CM

## 2020-06-18 DIAGNOSIS — I471 Supraventricular tachycardia: Secondary | ICD-10-CM | POA: Diagnosis not present

## 2020-06-18 MED ORDER — FUROSEMIDE 40 MG PO TABS: 40.0000 mg | ORAL_TABLET | Freq: Every day | ORAL | 3 refills | Status: DC

## 2020-06-18 MED ORDER — METOPROLOL SUCCINATE ER 50 MG PO TB24: 50.0000 mg | ORAL_TABLET | Freq: Every day | ORAL | 3 refills | Status: DC

## 2020-06-18 MED ORDER — SPIRONOLACTONE 25 MG PO TABS: 25.0000 mg | ORAL_TABLET | Freq: Every day | ORAL | 3 refills | Status: DC

## 2020-06-18 MED ORDER — ENTRESTO 97-103 MG PO TABS: 1.0000 | ORAL_TABLET | Freq: Two times a day (BID) | ORAL | 3 refills | Status: DC

## 2020-06-18 NOTE — Addendum Note (Signed)
Addended by: Theresia Majors on: 06/18/2020 04:08 PM   Modules accepted: Orders

## 2020-06-18 NOTE — Progress Notes (Signed)
Cardiology Office Note:    Date:  06/18/2020   ID:  DUANNE DUCHESNE, DOB February 28, 1955, MRN [id number]  PCP:  Georgann Housekeeper, MD  Cardiologist:  [name], MD    Referring MD: Georgann Housekeeper, MD   Chief Complaint  Patient presents with   Cardiomyopathy   Congestive Heart Failure   Hypertension   Atrial Fibrillation     History of Present Illness:    [name] is a 65 y.o. male with a hx of nonischemic DCM (normal coronary arteries by cath 1996), Sarcoidosis, chronic systolic CHF, HTN, CHB s/p dual chamber AICD and SVT s/p ablation.    He is here today for followup and is doing well.  He has chronic DOE and LE edema which are stable.  He denies any chest pain or pressure, PND, orthopnea, dizziness, palpitations or syncope. He is compliant with his meds and is tolerating meds with no SE.      Past Medical History:  Diagnosis Date   Asthma    Automatic implantable cardioverter-defibrillator in situ    Back pain    Chronic systolic CHF (congestive heart failure), NYHA class 3 (HCC)    Complete heart block (HCC)    s/p PPM 1997 with upgrade to AICD 2011   Depression    Dilated aortic root (HCC)    9mm ascending aortic root   DM (diabetes mellitus) (HCC)    Glaucoma    HTN (hypertension)    Hyperlipidemia    Nonischemic cardiomyopathy (HCC)    EF of 15-20% by echo 2015 and 20% by nuclear stress test 2017   Obesity    PAF (paroxysmal atrial fibrillation) (HCC)    13 minutes of PAF documented on ICD check 2018 - anticoagulated with Eliquis   Sarcoid    Sleep apnea 06-28-12   no cpap used-unable to tolerate   SVT (supraventricular tachycardia) (HCC)    s/p ablation    Past Surgical History:  Procedure Laterality Date   COLONOSCOPY WITH PROPOFOL N/A 07/17/2012   Procedure: COLONOSCOPY WITH PROPOFOL;  Surgeon: Charolett Bumpers, MD;  Location: [facility];  Service: Endoscopy;  Laterality: N/A;   HEMORRHOID SURGERY     early 20's   ICD GENERATOR CHANGEOUT N/A  05/28/2018   Procedure: ICD GENERATOR CHANGEOUT;  Surgeon: Marinus Maw, MD;  Location: [facility] INVASIVE CV LAB;  Service: Cardiovascular;  Laterality: N/A;   PACEMAKER INSERTION     TONSILLECTOMY      Current Medications: Current Meds  Medication Sig   albuterol (PROVENTIL HFA;VENTOLIN HFA) 108 (90 BASE) MCG/ACT inhaler Inhale 2 puffs into the lungs every 4 (four) hours as needed for wheezing. For shortness of breath   furosemide (LASIX) 40 MG tablet TAKE 1 TABLET BY MOUTH EVERY DAY   JARDIANCE 25 MG TABS tablet Take 25 mg by mouth daily.   metoprolol succinate (TOPROL-XL) 50 MG 24 hr tablet Take 1 tablet (50 mg total) by mouth daily. Take with or immediately following a meal.   OZEMPIC, 0.25 OR 0.5 MG/DOSE, 2 MG/1.5ML SOPN Inject 1 mg into the skin once a week.   rivaroxaban (XARELTO) 20 MG TABS tablet Take 1 tablet (20 mg total) by mouth daily with supper.   sacubitril-valsartan (ENTRESTO) 97-103 MG Take 1 tablet by mouth 2 (two) times daily.   simvastatin (ZOCOR) 40 MG tablet Take 40 mg by mouth every evening.   spironolactone (ALDACTONE) 25 MG tablet Take 1 tablet (25 mg total) by mouth daily.   SYMBICORT 80-4.5  MCG/ACT inhaler Inhale 2 puffs into the lungs 2 (two) times a day.   TRESIBA FLEXTOUCH 200 UNIT/ML SOPN Inject 80 Units into the skin every morning.     Allergies:   Penicillins and Penicillin g benzathine   Social History   Socioeconomic History   Marital status: Married    Spouse name: Not on file   Number of children: Not on file   Years of education: Not on file   Highest education level: Not on file  Occupational History   Not on file  Tobacco Use   Smoking status: Never   Smokeless tobacco: Never  Vaping Use   Vaping Use: Never used  Substance and Sexual Activity   Alcohol use: Yes    Comment: occasionally   Drug use: No   Sexual activity: Yes  Other Topics Concern   Not on file  Social History Narrative   Not on file   Social Determinants of Health    Financial Resource Strain: Not on file  Food Insecurity: Not on file  Transportation Needs: Not on file  Physical Activity: Not on file  Stress: Not on file  Social Connections: Not on file     Family History: The patient's family history includes Alzheimer's disease in his mother; CVA in his father; Diabetes in his brother, mother, and sister; Epilepsy in his sister; Heart failure in his father.  ROS:   Please see the history of present illness.    ROS  All other systems reviewed and negative.   EKGs/Labs/Other Studies Reviewed:    The following studies were reviewed today: none  EKG:  EKG is not ordered today.  Recent Labs: 03/19/2020: BUN 9; Creatinine, Ser 0.98; Potassium 4.1; Sodium 141   Recent Lipid Panel    Component Value Date/Time   CHOL  09/22/2009 0330    134        ATP III CLASSIFICATION:  <200     mg/dL   Desirable  016-553  mg/dL   Borderline High  >=748    mg/dL   High          TRIG 270 09/22/2009 0330   HDL 47 09/22/2009 0330   CHOLHDL 2.9 09/22/2009 0330   VLDL 25 09/22/2009 0330   LDLCALC  09/22/2009 0330    62        Total Cholesterol/HDL:CHD Risk Coronary Heart Disease Risk Table                     Men   Women  1/2 Average Risk   3.4   3.3  Average Risk       5.0   4.4  2 X Average Risk   9.6   7.1  3 X Average Risk  23.4   11.0        Use the calculated Patient Ratio above and the CHD Risk Table to determine the patient's CHD Risk.        ATP III CLASSIFICATION (LDL):  <100     mg/dL   Optimal  786-754  mg/dL   Near or Above                    Optimal  130-159  mg/dL   Borderline  492-010  mg/dL   High  >071     mg/dL   Very High    Physical Exam:    VS:  BP 100/64   Pulse 81   Ht 5\' 11"  (1.803 m)  Wt (!) 358 lb 6.4 oz (162.6 kg)   SpO2 94%   BMI 49.99 kg/m     Wt Readings from Last 3 Encounters:  06/18/20 (!) 358 lb 6.4 oz (162.6 kg)  03/11/20 (!) 369 lb (167.4 kg)  06/26/19 (!) 363 lb (164.7 kg)    GEN: Well  nourished, well developed in no acute distress HEENT: Normal NECK: No JVD; No carotid bruits LYMPHATICS: No lymphadenopathy CARDIAC:RRR, no murmurs, rubs, gallops RESPIRATORY:  Clear to auscultation without rales, wheezing or rhonchi  ABDOMEN: Soft, non-tender, non-distended MUSCULOSKELETAL:  No edema; No deformity  SKIN: Warm and dry NEUROLOGIC:  Alert and oriented x 3 PSYCHIATRIC:  Normal affect   ASSESSMENT:    1. Chronic systolic heart failure (HCC)   2. Nonischemic cardiomyopathy (HCC)   3. Primary hypertension   4. Complete heart block (HCC)   5. SVT (supraventricular tachycardia) (HCC)   6. PAF (paroxysmal atrial fibrillation) (HCC)    PLAN:    In order of problems listed above:  1.  Chronic systolic CHF  -his weight is stable on exam today -he appears euvolemic on exam today -remains NYHA class IIa -Continue prescription drug management with Entrestor 97-103mg  BID, Toprol XL 50mg  daily, sprio 25mg  daily, Jardiance 25mg  daily and Lasix 40mg  daily>>refilled for 6 months today -I have personally reviewed and interpreted outside labs performed by patient's PCP which showed SCR 0.98, K+ 4.1 and TSH 1.91 in Oct 2021 -repeat BMET  -2D echo 02/2017 with EF 15-20%   2.  NIDCM -normal coronary arteries were noted at time of cath and work-up of his cardiomyopathy.   -His last echo in February 2019 showed EF 15 to 20%.   -He is status post dual-chamber AICD.   -PYP scan equivocal  for amyloid  -urine and protein electrophoresis were normal -repeat echo to see if LVF and RVF still reduced   3.  HTN  -BP is adequately controlled on exam today -Continue prescription drug management with Entresto 97-103mg  BID, Toprol XL 50mg  dialy and spiro 25mg  daily>>refilled for 6 months today   4.  CHB   -he is status post dual-chamber AICD and is followed in device clinic.   5.  SVT  -he is status post SVT ablation remotely.  -he has not had any palpitations -continue Toprol   6.  PAF   -he was noted on ICD check in 2018 to have PAF. -His CHADS2VASC score is 2  -he is maintaining NSR on exam and has not had any palpitations and no bleeding problems on DOAC -Continue prescription drug management with Xarelto 20mg  daily and Toprol XL 50mg  daily -I have personally reviewed and interpreted outside labs performed by patient's PCP which showed Hbg 11 in Oct 2021 -repeat CBC      Medication Adjustments/Labs and Tests Ordered: Current medicines are reviewed at length with the patient today.  Concerns regarding medicines are outlined above.  No orders of the defined types were placed in this encounter.  No orders of the defined types were placed in this encounter.   Signed, 03/2017, MD  06/18/2020 3:43 PM    [facility]

## 2020-06-18 NOTE — Addendum Note (Signed)
Addended by: Theresia Majors on: 06/18/2020 03:50 PM   Modules accepted: Orders

## 2020-06-18 NOTE — Patient Instructions (Signed)
Medication Instructions:  Your physician recommends that you continue on your current medications as directed. Please refer to the Current Medication list given to you today.  *If you need a refill on your cardiac medications before your next appointment, please call your pharmacy*   Lab Work: TODAY: BMET and CBC If you have labs (blood work) drawn today and your tests are completely normal, you will receive your results only by: MyChart Message (if you have MyChart) OR A paper copy in the mail If you have any lab test that is abnormal or we need to change your treatment, we will call you to review the results.  Follow-Up: At [facility], you and your health needs are our priority.  As part of our continuing mission to provide you with exceptional heart care, we have created designated Provider Care Teams.  These Care Teams include your primary Cardiologist (physician) and Advanced Practice Providers (APPs -  Physician Assistants and Nurse Practitioners) who all work together to provide you with the care you need, when you need it.   Your next appointment:   6 month(s)  The format for your next appointment:   In Person  Provider:   You may see Traci Turner, MD or one of the following Advanced Practice Providers on your designated Care Team:   Dayna Dunn, PA-C Michele Lenze, PA-C   

## 2020-06-19 LAB — CBC
Hematocrit: 39.9 % (ref 37.5–51.0)
Hemoglobin: 12.4 g/dL — ABNORMAL LOW (ref 13.0–17.7)
MCH: 23.7 pg — ABNORMAL LOW (ref 26.6–33.0)
MCHC: 31.1 g/dL — ABNORMAL LOW (ref 31.5–35.7)
MCV: 76 fL — ABNORMAL LOW (ref 79–97)
Platelets: 346 10*3/uL (ref 150–450)
RBC: 5.23 x10E6/uL (ref 4.14–5.80)
RDW: 17 % — ABNORMAL HIGH (ref 11.6–15.4)
WBC: 8.8 10*3/uL (ref 3.4–10.8)

## 2020-06-19 LAB — BASIC METABOLIC PANEL
BUN/Creatinine Ratio: 8 — ABNORMAL LOW (ref 10–24)
BUN: 10 mg/dL (ref 8–27)
CO2: 21 mmol/L (ref 20–29)
Calcium: 10.7 mg/dL — ABNORMAL HIGH (ref 8.6–10.2)
Chloride: 107 mmol/L — ABNORMAL HIGH (ref 96–106)
Creatinine, Ser: 1.18 mg/dL (ref 0.76–1.27)
Glucose: 161 mg/dL — ABNORMAL HIGH (ref 65–99)
Potassium: 4.7 mmol/L (ref 3.5–5.2)
Sodium: 140 mmol/L (ref 134–144)
eGFR: 69 mL/min/{1.73_m2} (ref >59)

## 2020-06-22 DIAGNOSIS — E1165 Type 2 diabetes mellitus with hyperglycemia: Secondary | ICD-10-CM | POA: Diagnosis not present

## 2020-06-22 DIAGNOSIS — J45909 Unspecified asthma, uncomplicated: Secondary | ICD-10-CM | POA: Diagnosis not present

## 2020-06-22 DIAGNOSIS — I10 Essential (primary) hypertension: Secondary | ICD-10-CM | POA: Diagnosis not present

## 2020-06-22 DIAGNOSIS — H409 Unspecified glaucoma: Secondary | ICD-10-CM | POA: Diagnosis not present

## 2020-06-22 DIAGNOSIS — E1142 Type 2 diabetes mellitus with diabetic polyneuropathy: Secondary | ICD-10-CM | POA: Diagnosis not present

## 2020-06-22 DIAGNOSIS — I5022 Chronic systolic (congestive) heart failure: Secondary | ICD-10-CM | POA: Diagnosis not present

## 2020-06-22 DIAGNOSIS — I4891 Unspecified atrial fibrillation: Secondary | ICD-10-CM | POA: Diagnosis not present

## 2020-06-22 DIAGNOSIS — E782 Mixed hyperlipidemia: Secondary | ICD-10-CM | POA: Diagnosis not present

## 2020-07-30 DIAGNOSIS — I4891 Unspecified atrial fibrillation: Secondary | ICD-10-CM | POA: Diagnosis not present

## 2020-07-30 DIAGNOSIS — I5022 Chronic systolic (congestive) heart failure: Secondary | ICD-10-CM | POA: Diagnosis not present

## 2020-07-30 DIAGNOSIS — H409 Unspecified glaucoma: Secondary | ICD-10-CM | POA: Diagnosis not present

## 2020-07-30 DIAGNOSIS — E782 Mixed hyperlipidemia: Secondary | ICD-10-CM | POA: Diagnosis not present

## 2020-07-30 DIAGNOSIS — J45909 Unspecified asthma, uncomplicated: Secondary | ICD-10-CM | POA: Diagnosis not present

## 2020-07-30 DIAGNOSIS — E1142 Type 2 diabetes mellitus with diabetic polyneuropathy: Secondary | ICD-10-CM | POA: Diagnosis not present

## 2020-07-30 DIAGNOSIS — E1165 Type 2 diabetes mellitus with hyperglycemia: Secondary | ICD-10-CM | POA: Diagnosis not present

## 2020-07-30 DIAGNOSIS — I10 Essential (primary) hypertension: Secondary | ICD-10-CM | POA: Diagnosis not present

## 2020-08-28 ENCOUNTER — Ambulatory Visit (INDEPENDENT_AMBULATORY_CARE_PROVIDER_SITE_OTHER): Payer: Medicare Other

## 2020-08-28 DIAGNOSIS — I5022 Chronic systolic (congestive) heart failure: Secondary | ICD-10-CM | POA: Diagnosis not present

## 2020-08-28 DIAGNOSIS — I428 Other cardiomyopathies: Secondary | ICD-10-CM

## 2020-08-28 LAB — CUP PACEART REMOTE DEVICE CHECK
Battery Remaining Longevity: 62 mo
Battery Remaining Percentage: 67 %
Battery Voltage: 2.96 V
Brady Statistic AP VP Percent: 4.1 %
Brady Statistic AP VS Percent: 1 %
Brady Statistic AS VP Percent: 93 %
Brady Statistic AS VS Percent: 2 %
Brady Statistic RA Percent Paced: 3.5 %
Brady Statistic RV Percent Paced: 97 %
Date Time Interrogation Session: 20220819025417
HighPow Impedance: 45 Ohm
HighPow Impedance: 46 Ohm
Implantable Lead Implant Date: 19961213
Implantable Lead Implant Date: 20111202
Implantable Lead Location: 753859
Implantable Lead Location: 753860
Implantable Lead Model: 5524
Implantable Lead Model: 7121
Implantable Pulse Generator Implant Date: 20200518
Lead Channel Impedance Value: 360 Ohm
Lead Channel Impedance Value: 490 Ohm
Lead Channel Pacing Threshold Amplitude: 0.75 V
Lead Channel Pacing Threshold Amplitude: 0.875 V
Lead Channel Pacing Threshold Pulse Width: 0.5 ms
Lead Channel Pacing Threshold Pulse Width: 0.5 ms
Lead Channel Sensing Intrinsic Amplitude: 1.6 mV
Lead Channel Sensing Intrinsic Amplitude: 12 mV
Lead Channel Setting Pacing Amplitude: 2 V
Lead Channel Setting Pacing Amplitude: 2 V
Lead Channel Setting Pacing Pulse Width: 0.5 ms
Lead Channel Setting Sensing Sensitivity: 0.5 mV
Pulse Gen Serial Number: 9829382

## 2020-08-31 DIAGNOSIS — H409 Unspecified glaucoma: Secondary | ICD-10-CM | POA: Diagnosis not present

## 2020-08-31 DIAGNOSIS — E1165 Type 2 diabetes mellitus with hyperglycemia: Secondary | ICD-10-CM | POA: Diagnosis not present

## 2020-08-31 DIAGNOSIS — I10 Essential (primary) hypertension: Secondary | ICD-10-CM | POA: Diagnosis not present

## 2020-08-31 DIAGNOSIS — E782 Mixed hyperlipidemia: Secondary | ICD-10-CM | POA: Diagnosis not present

## 2020-08-31 DIAGNOSIS — J45909 Unspecified asthma, uncomplicated: Secondary | ICD-10-CM | POA: Diagnosis not present

## 2020-08-31 DIAGNOSIS — E1142 Type 2 diabetes mellitus with diabetic polyneuropathy: Secondary | ICD-10-CM | POA: Diagnosis not present

## 2020-08-31 DIAGNOSIS — I5022 Chronic systolic (congestive) heart failure: Secondary | ICD-10-CM | POA: Diagnosis not present

## 2020-08-31 DIAGNOSIS — I4891 Unspecified atrial fibrillation: Secondary | ICD-10-CM | POA: Diagnosis not present

## 2020-09-12 NOTE — Progress Notes (Signed)
Remote ICD transmission.   

## 2020-10-28 DIAGNOSIS — I471 Supraventricular tachycardia: Secondary | ICD-10-CM | POA: Diagnosis not present

## 2020-10-28 DIAGNOSIS — Z1389 Encounter for screening for other disorder: Secondary | ICD-10-CM | POA: Diagnosis not present

## 2020-10-28 DIAGNOSIS — I4891 Unspecified atrial fibrillation: Secondary | ICD-10-CM | POA: Diagnosis not present

## 2020-10-28 DIAGNOSIS — E782 Mixed hyperlipidemia: Secondary | ICD-10-CM | POA: Diagnosis not present

## 2020-10-28 DIAGNOSIS — Z0000 Encounter for general adult medical examination without abnormal findings: Secondary | ICD-10-CM | POA: Diagnosis not present

## 2020-10-28 DIAGNOSIS — E1142 Type 2 diabetes mellitus with diabetic polyneuropathy: Secondary | ICD-10-CM | POA: Diagnosis not present

## 2020-10-28 DIAGNOSIS — I10 Essential (primary) hypertension: Secondary | ICD-10-CM | POA: Diagnosis not present

## 2020-10-28 DIAGNOSIS — Z95810 Presence of automatic (implantable) cardiac defibrillator: Secondary | ICD-10-CM | POA: Diagnosis not present

## 2020-10-28 DIAGNOSIS — I5022 Chronic systolic (congestive) heart failure: Secondary | ICD-10-CM | POA: Diagnosis not present

## 2020-10-28 DIAGNOSIS — J45909 Unspecified asthma, uncomplicated: Secondary | ICD-10-CM | POA: Diagnosis not present

## 2020-10-28 DIAGNOSIS — G4733 Obstructive sleep apnea (adult) (pediatric): Secondary | ICD-10-CM | POA: Diagnosis not present

## 2020-10-28 DIAGNOSIS — Z23 Encounter for immunization: Secondary | ICD-10-CM | POA: Diagnosis not present

## 2020-10-28 DIAGNOSIS — I700 Atherosclerosis of aorta: Secondary | ICD-10-CM | POA: Diagnosis not present

## 2020-10-28 DIAGNOSIS — D6869 Other thrombophilia: Secondary | ICD-10-CM | POA: Diagnosis not present

## 2020-11-13 DIAGNOSIS — E782 Mixed hyperlipidemia: Secondary | ICD-10-CM | POA: Diagnosis not present

## 2020-11-13 DIAGNOSIS — I5022 Chronic systolic (congestive) heart failure: Secondary | ICD-10-CM | POA: Diagnosis not present

## 2020-11-13 DIAGNOSIS — E1142 Type 2 diabetes mellitus with diabetic polyneuropathy: Secondary | ICD-10-CM | POA: Diagnosis not present

## 2020-11-13 DIAGNOSIS — I10 Essential (primary) hypertension: Secondary | ICD-10-CM | POA: Diagnosis not present

## 2020-11-13 DIAGNOSIS — I4891 Unspecified atrial fibrillation: Secondary | ICD-10-CM | POA: Diagnosis not present

## 2020-11-13 DIAGNOSIS — J45909 Unspecified asthma, uncomplicated: Secondary | ICD-10-CM | POA: Diagnosis not present

## 2020-11-13 DIAGNOSIS — E1165 Type 2 diabetes mellitus with hyperglycemia: Secondary | ICD-10-CM | POA: Diagnosis not present

## 2020-11-13 DIAGNOSIS — H409 Unspecified glaucoma: Secondary | ICD-10-CM | POA: Diagnosis not present

## 2020-11-27 ENCOUNTER — Ambulatory Visit (INDEPENDENT_AMBULATORY_CARE_PROVIDER_SITE_OTHER): Payer: Medicare Other

## 2020-11-27 DIAGNOSIS — I5022 Chronic systolic (congestive) heart failure: Secondary | ICD-10-CM

## 2020-11-27 LAB — CUP PACEART REMOTE DEVICE CHECK
Battery Remaining Longevity: 60 mo
Battery Remaining Percentage: 64 %
Battery Voltage: 2.95 V
Brady Statistic AP VP Percent: 4.4 %
Brady Statistic AP VS Percent: 1 %
Brady Statistic AS VP Percent: 93 %
Brady Statistic AS VS Percent: 1.9 %
Brady Statistic RA Percent Paced: 3.7 %
Brady Statistic RV Percent Paced: 97 %
Date Time Interrogation Session: 20221118023106
HighPow Impedance: 45 Ohm
HighPow Impedance: 45 Ohm
Implantable Lead Implant Date: 19961213
Implantable Lead Implant Date: 20111202
Implantable Lead Location: 753859
Implantable Lead Location: 753860
Implantable Lead Model: 5524
Implantable Lead Model: 7121
Implantable Pulse Generator Implant Date: 20200518
Lead Channel Impedance Value: 350 Ohm
Lead Channel Impedance Value: 480 Ohm
Lead Channel Pacing Threshold Amplitude: 0.75 V
Lead Channel Pacing Threshold Amplitude: 0.875 V
Lead Channel Pacing Threshold Pulse Width: 0.5 ms
Lead Channel Pacing Threshold Pulse Width: 0.5 ms
Lead Channel Sensing Intrinsic Amplitude: 1.5 mV
Lead Channel Sensing Intrinsic Amplitude: 12 mV
Lead Channel Setting Pacing Amplitude: 2 V
Lead Channel Setting Pacing Amplitude: 2 V
Lead Channel Setting Pacing Pulse Width: 0.5 ms
Lead Channel Setting Sensing Sensitivity: 0.5 mV
Pulse Gen Serial Number: 9829382

## 2020-12-07 NOTE — Progress Notes (Signed)
Remote ICD transmission.   

## 2021-01-19 ENCOUNTER — Encounter: Payer: Self-pay | Admitting: Cardiology

## 2021-01-19 ENCOUNTER — Other Ambulatory Visit: Payer: Self-pay

## 2021-01-19 ENCOUNTER — Telehealth (INDEPENDENT_AMBULATORY_CARE_PROVIDER_SITE_OTHER): Payer: Medicare Other | Admitting: Cardiology

## 2021-01-19 VITALS — BP 108/72 | HR 74 | Ht 71.0 in | Wt 349.0 lb

## 2021-01-19 DIAGNOSIS — I428 Other cardiomyopathies: Secondary | ICD-10-CM | POA: Diagnosis not present

## 2021-01-19 DIAGNOSIS — I471 Supraventricular tachycardia: Secondary | ICD-10-CM | POA: Diagnosis not present

## 2021-01-19 DIAGNOSIS — I442 Atrioventricular block, complete: Secondary | ICD-10-CM | POA: Diagnosis not present

## 2021-01-19 DIAGNOSIS — I480 Paroxysmal atrial fibrillation: Secondary | ICD-10-CM | POA: Diagnosis not present

## 2021-01-19 DIAGNOSIS — I5022 Chronic systolic (congestive) heart failure: Secondary | ICD-10-CM | POA: Diagnosis not present

## 2021-01-19 DIAGNOSIS — I10 Essential (primary) hypertension: Secondary | ICD-10-CM | POA: Diagnosis not present

## 2021-01-19 NOTE — Addendum Note (Signed)
Addended by: Kerney Elbe on: 01/19/2021 09:51 AM   Modules accepted: Orders

## 2021-01-19 NOTE — Addendum Note (Signed)
Addended by: Kerney Elbe on: 01/19/2021 10:08 AM   Modules accepted: Orders

## 2021-01-19 NOTE — Patient Instructions (Signed)
Medication Instructions:  Your physician recommends that you continue on your current medications as directed. Please refer to the Current Medication list given to you today.  *If you need a refill on your cardiac medications before your next appointment, please call your pharmacy*   Lab Work: Your physician recommends that you return for lab work in: Today   If you have labs (blood work) drawn today and your tests are completely normal, you will receive your results only by: MyChart Message (if you have MyChart) OR A paper copy in the mail If you have any lab test that is abnormal or we need to change your treatment, we will call you to review the results.   Testing/Procedures: Your physician has requested that you have an echocardiogram. Echocardiography is a painless test that uses sound waves to create images of your heart. It provides your doctor with information about the size and shape of your heart and how well your hearts chambers and valves are working. This procedure takes approximately one hour. There are no restrictions for this procedure.    Follow-Up: At [facility], you and your health needs are our priority.  As part of our continuing mission to provide you with exceptional heart care, we have created designated Provider Care Teams.  These Care Teams include your primary Cardiologist (physician) and Advanced Practice Providers (APPs -  Physician Assistants and Nurse Practitioners) who all work together to provide you with the care you need, when you need it.  We recommend signing up for the patient portal called "MyChart".  Sign up information is provided on this After Visit Summary.  MyChart is used to connect with patients for Virtual Visits (Telemedicine).  Patients are able to view lab/test results, encounter notes, upcoming appointments, etc.  Non-urgent messages can be sent to your provider as well.   To learn more about what you can do with MyChart, go to  [email or web address].    Your next appointment:   6 month(s)  The format for your next appointment:   In Person  Provider:   Fransico Him, MD     Other Instructions Thank you for choosing [facility]!

## 2021-01-19 NOTE — Progress Notes (Signed)
Virtual Visit via Video Note   This visit type was conducted due to national recommendations for restrictions regarding the COVID-19 Pandemic (e.g. social distancing) in an effort to limit this patient's exposure and mitigate transmission in our community.  Due to his co-morbid illnesses, this patient is at least at moderate risk for complications without adequate follow up.  This format is felt to be most appropriate for this patient at this time.  All issues noted in this document were discussed and addressed.  A limited physical exam was performed with this format.  Please refer to the patient's chart for his consent to telehealth for [facility].   Date:  01/19/2021   ID:  LERON STOFFERS, DOB [birth year], MRN [id number] The patient was identified using 2 identifiers.  Patient Location: Home Provider Location: Home Office   PCP:  Georgann Housekeeper, MD   [facility] Providers Cardiologist:  [name], MD     Evaluation Performed:  Follow-Up Visit  Chief Complaint:  DCM, CHF, HTN, SVT  History of Present Illness:    [name] is a 66 y.o. male with  a hx of nonischemic DCM (normal coronary arteries by cath 1996), Sarcoidosis, chronic systolic CHF, HTN, CHB s/p dual chamber AICD and SVT s/p ablation.    He is here today for followup and is doing well.  He denies any chest pain or pressure, SOB, DOE, PND, orthopnea, dizziness, palpitations or syncope. He occasionally has some ankle edema.  He is compliant with his meds and is tolerating meds with no SE.     The patient does not have symptoms concerning for COVID-19 infection (fever, chills, cough, or new shortness of breath).    Past Medical History:  Diagnosis Date   Asthma    Automatic implantable cardioverter-defibrillator in situ    Back pain    Chronic systolic CHF (congestive heart failure), NYHA class 3 (HCC)    Complete heart block (HCC)    s/p PPM 1997 with upgrade to AICD 2011   Depression     Dilated aortic root (HCC)    15mm ascending aortic root   DM (diabetes mellitus) (HCC)    Glaucoma    HTN (hypertension)    Hyperlipidemia    Nonischemic cardiomyopathy (HCC)    EF of 15-20% by echo 2015 and 20% by nuclear stress test 2017   Obesity    PAF (paroxysmal atrial fibrillation) (HCC)    13 minutes of PAF documented on ICD check 2018 - anticoagulated with Eliquis   Sarcoid    Sleep apnea 06-28-12   no cpap used-unable to tolerate   SVT (supraventricular tachycardia) (HCC)    s/p ablation   Past Surgical History:  Procedure Laterality Date   COLONOSCOPY WITH PROPOFOL N/A 07/17/2012   Procedure: COLONOSCOPY WITH PROPOFOL;  Surgeon: Charolett Bumpers, MD;  Location: [facility];  Service: Endoscopy;  Laterality: N/A;   HEMORRHOID SURGERY     early 20's   ICD GENERATOR CHANGEOUT N/A 05/28/2018   Procedure: ICD GENERATOR CHANGEOUT;  Surgeon: Marinus Maw, MD;  Location: [facility] INVASIVE CV LAB;  Service: Cardiovascular;  Laterality: N/A;   PACEMAKER INSERTION     TONSILLECTOMY       Current Meds  Medication Sig   furosemide (LASIX) 40 MG tablet Take 1 tablet (40 mg total) by mouth daily.   JARDIANCE 25 MG TABS tablet Take 25 mg by mouth daily.   metoprolol succinate (TOPROL-XL) 50 MG 24 hr tablet Take 1 tablet (  50 mg total) by mouth daily. Take with or immediately following a meal.   OZEMPIC, 0.25 OR 0.5 MG/DOSE, 2 MG/1.5ML SOPN Inject 1 mg into the skin once a week.   rivaroxaban (XARELTO) 20 MG TABS tablet Take 1 tablet (20 mg total) by mouth daily with supper.   sacubitril-valsartan (ENTRESTO) 97-103 MG Take 1 tablet by mouth 2 (two) times daily.   simvastatin (ZOCOR) 40 MG tablet Take 40 mg by mouth every evening.   spironolactone (ALDACTONE) 25 MG tablet Take 1 tablet (25 mg total) by mouth daily.     Allergies:   Penicillins and Penicillin g benzathine   Social History   Tobacco Use   Smoking status: Never   Smokeless tobacco: Never  Vaping Use   Vaping Use:  Never used  Substance Use Topics   Alcohol use: Not Currently    Comment: occasionally   Drug use: No     Family Hx: The patient's family history includes Alzheimer's disease in his mother; CVA in his father; Diabetes in his brother, mother, and sister; Epilepsy in his sister; Heart failure in his father.  ROS:   Please see the history of present illness.     All other systems reviewed and are negative.   Prior CV studies:   The following studies were reviewed today:  None  Labs/Other Tests and Data Reviewed:    EKG:  No ECG reviewed.  Recent Labs: 06/18/2020: BUN 10; Creatinine, Ser 1.18; Hemoglobin 12.4; Platelets 346; Potassium 4.7; Sodium 140   Recent Lipid Panel Lab Results  Component Value Date/Time   CHOL  09/22/2009 03:30 AM    134        ATP III CLASSIFICATION:  <200     mg/dL   Desirable  707-867  mg/dL   Borderline High  >=544    mg/dL   High          TRIG 920 09/22/2009 03:30 AM   HDL 47 09/22/2009 03:30 AM   CHOLHDL 2.9 09/22/2009 03:30 AM   LDLCALC  09/22/2009 03:30 AM    62        Total Cholesterol/HDL:CHD Risk Coronary Heart Disease Risk Table                     Men   Women  1/2 Average Risk   3.4   3.3  Average Risk       5.0   4.4  2 X Average Risk   9.6   7.1  3 X Average Risk  23.4   11.0        Use the calculated Patient Ratio above and the CHD Risk Table to determine the patient's CHD Risk.        ATP III CLASSIFICATION (LDL):  <100     mg/dL   Optimal  100-712  mg/dL   Near or Above                    Optimal  130-159  mg/dL   Borderline  197-588  mg/dL   High  >325     mg/dL   Very High    Wt Readings from Last 3 Encounters:  01/19/21 (!) 349 lb (158.3 kg)  06/18/20 (!) 358 lb 6.4 oz (162.6 kg)  03/11/20 (!) 369 lb (167.4 kg)     Risk Assessment/Calculations:          Objective:    Vital Signs:  BP 108/72    Pulse  74    Ht 5\' 11"  (1.803 m)    Wt (!) 349 lb (158.3 kg)    BMI 48.68 kg/m    VITAL SIGNS:  reviewed GEN:   no acute distress EYES:  sclerae anicteric, EOMI - Extraocular Movements Intact RESPIRATORY:  normal respiratory effort, symmetric expansion CARDIOVASCULAR:  no peripheral edema SKIN:  no rash, lesions or ulcers. MUSCULOSKELETAL:  no obvious deformities. NEURO:  alert and oriented x 3, no obvious focal deficit PSYCH:  normal affect  ASSESSMENT & PLAN:    1.  Chronic systolic CHF  -he appears euvolemic on exam today and weight has been decreasing after improving his eating habits -remains NYHA class IIa -Continue prescription drug management with Entresto 97-103mg  BID, Toprol XL 50mg  daily, Spiro 25mg  daily, Jardiance 25mg  daily, Lasix 40mg  daily with PRN refills  -check BMET -2D echo 02/2017 with EF 15-20%   2.  NIDCM -normal coronary arteries were noted at time of cath and work-up of his cardiomyopathy.   -His last echo in February 2019 showed EF 15 to 20%.   -He is status post dual-chamber AICD.   -PYP scan equivocal  for amyloid  -urine and protein electrophoresis were normal -repeat 2D echo to reassess LVF on max GDMT   3.  HTN  -Bp controlled today on exam -Continue on Entresto, Toprol, Spiro   4.  CHB   -he is status post dual-chamber AICD and is followed in device clinic.   5.  SVT  -he is status post SVT ablation remotely.  -he has not had any palpitations since I saw him last -continue  Toprol XL 50mg  daily   6.  PAF  -he was noted on ICD check in 2018 to have PAF. -His CHADS2VASC score is 2  -he is maintaining NSR on exam and has not had any palpitations and no bleeding problems on DOAC -Continue prescription drug management with Xarelto 20mg  daily and Toprol XL 50mg  daily with PRN refills -repeat CBC and BMET  Followup with me in 6 months  COVID-19 Education: The signs and symptoms of COVID-19 were discussed with the patient and how to seek care for testing (follow up with PCP or arrange E-visit).  The importance of social distancing was discussed  today.  Time:   Today, I have spent 20 minutes with the patient with telehealth technology discussing the above problems.     Medication Adjustments/Labs and Tests Ordered: Current medicines are reviewed at length with the patient today.  Concerns regarding medicines are outlined above.   Tests Ordered: No orders of the defined types were placed in this encounter.   Medication Changes: No orders of the defined types were placed in this encounter.   Follow Up:  In Person in 6 month(s)  Signed, , MD  01/19/2021 9:32 AM     [facility]

## 2021-01-20 ENCOUNTER — Other Ambulatory Visit: Payer: Self-pay

## 2021-01-20 NOTE — Patient Outreach (Signed)
Aging Gracefully Program  01/20/2021  [name] [birth year] [id number]  [facility] Evaluation Interviewer made contact with patient. Aging Gracefully initial survey scheduled for Tuesday, January 26, 2021 at 11:30 by phone.   Baruch Gouty [facility] Management Assistant [phone]

## 2021-01-26 ENCOUNTER — Other Ambulatory Visit: Payer: Self-pay

## 2021-01-26 NOTE — Patient Outreach (Signed)
Aging Gracefully Program  01/26/2021  [name] 1955/08/05 [id number]   [facility] Evaluation Interviewer made contact with patient. Aging Gracefully initial survey completed.   Interviewer will send referral to Rowe Pavy, RN and OT for follow up.   [facility] Care Management Assistant  [phone]

## 2021-02-03 ENCOUNTER — Other Ambulatory Visit: Payer: Self-pay

## 2021-02-03 ENCOUNTER — Telehealth: Payer: Self-pay

## 2021-02-03 ENCOUNTER — Ambulatory Visit (HOSPITAL_COMMUNITY): Payer: Medicare Other | Attending: Cardiovascular Disease

## 2021-02-03 DIAGNOSIS — I428 Other cardiomyopathies: Secondary | ICD-10-CM

## 2021-02-03 DIAGNOSIS — I5022 Chronic systolic (congestive) heart failure: Secondary | ICD-10-CM

## 2021-02-03 LAB — ECHOCARDIOGRAM COMPLETE
Area-P 1/2: 6.22 cm2
S' Lateral: 5.5 cm

## 2021-02-03 MED ORDER — PERFLUTREN LIPID MICROSPHERE
1.0000 mL | INTRAVENOUS | Status: AC | PRN
  Administered 2021-02-03: 2 mL via INTRAVENOUS

## 2021-02-03 NOTE — Telephone Encounter (Signed)
-----   Message from Sueanne Margarita, MD sent at 02/03/2021  4:09 PM EST ----- 2D echo shows no improvement in EF on max GDMT with EF unchanged at 20%.  Please refer to advanced CHF clinic

## 2021-02-03 NOTE — Telephone Encounter (Signed)
The patient has been notified of the result and verbalized understanding.  All questions (if any) were answered. Theresia Majors, RN 02/03/2021 4:32 PM  Referral has been placed.

## 2021-02-06 ENCOUNTER — Other Ambulatory Visit: Payer: Medicare Other | Admitting: Occupational Therapy

## 2021-02-06 ENCOUNTER — Other Ambulatory Visit: Payer: Self-pay

## 2021-02-06 NOTE — Patient Instructions (Signed)
Goals Addressed             This Visit's Progress    Patient stated       He would like to feel more safe getting in and out of his house and up/down the steps. A railing around porch is recommended and pt to consider ramp at front door (dependent on cost and length of ramp needed to meet code)     Patient Stated       He would like to feel safer getting down to and up from his laundry room (grab bar on wall to the right as you go up the steps is recommended)     Patient Stated       He would like to feel more safe getting in and out of his tub. (Options are full tub to shower conversion OR a tub cut out, keeping his current grab bars and handheld shower) and also providing him a shower seat.     Patient Stated       He would like for it to be easier to get his socks on ( a wide sock aid would be of benefit)

## 2021-02-06 NOTE — Patient Outreach (Signed)
Aging Gracefully Program  OT Initial Visit  02/06/2021  [name] July 22, 1955 [id number]  Visit:  1- Initial Visit  Start Time:  [id number] End Time:  [id number] Total Minutes:  74  CCAP: Typical Daily Routine: Typical Daily Routine:: Gets up later morning, fixes most of his food, watches tv, does games on computer, goes to store as needed, walks up and down driveway What Types Of Care Problems Are You Having Throughout The Day?: Balance in shower, intermittent low back pain and feeling weak in the legs What Kind Of Help Do You Receive?: none Do You Think You Need Other Types Of Help?: not really What Do You Think Would Make Everyday Life Easier For You?: walk in shower What Is A Good Day Like?: all days are pretty even keel Do You Have Time For Yourself?: yes--alot Patient Reported Equipment: Patient Reported Equipment Currently Used: Grab Bars, Raised Toilet Seat Functional Mobility-Maintain Balance While Showering: Maintaining Balance While Showering: Moderate Difficulty Do You:: Use A Device Importance Of Learning New Strategies:: Very Much Observation: Maintain Balance While Showering: Independent With Pain, Difficulty, Or Use Of Device Safety: A Little Risk Efficiency: Somewhat Intervention: Yes Other Comments:: walk in shower with shower seat or but out tub recommended. Either way with grab bars, hand held shower head, and shower seat. Functional Mobility-Climb 1 Flight Of Stairs: Climb 1 Flight Of Stairs: Moderate Difficulty Do You:: Use A Device (handrail) Importance Of Learning New Strategies:: Moderate Efficiency: Somewhat Intervention:  (potentially) Other Comments:: Client to consider a ramp and definitely wants a railing around his porch for safety. Functional Mobility-Move In And Out Of Bath/Shower: Move In And Out Of A Bath/Shower: A Little Difficulty Do You:: Use A Device Importance Of Learning New Strategies:: Very Much Observation: Move In And Out Of  Bath/Shower: Independent With Pain, Difficulty, Or Use Of Device Safety: A Little Risk Efficiency: Somewhat Intervention: Yes Other Comments:: walk in shower or tub but out so step into tub is not so tall. Activities of Daily Living-Put On And Take Off Socks And Shoes: Put On And Take Off Socks And  Shoes: A Little Difficulty Do You:: No Device/No Assistance Importance Of Learning New Strategies: Moderate Safety: No Risk Efficiency: Somewhat Intervention: Yes Other Comments:: wide sock aid is recommended  Readiness To Change Score:  Readiness to Change Score: 10  Home Environment Assessment: Outside Home Entry:: 4 steps with rail on left hand side. Front porch does not have railing around it. Entryway/Foyer:: One step into the house. Kitchen:: Faucet leaks Bathroom:: Tub shower combination with 2 grab bars and a handheld shower. Grab bar at toilet with comfort height toilet. Threshold at door is loose. Faucet in tub leaks Laundry:: Down three steps into utility room Smoke/CO2 Detector:: Present Veterinary surgeon:: Does not have one Mailbox:: On side of house at front door Other Home Environment Concerns:: You can feel air coming around back door  Durable Medical Equipment:    Patient Education:  Safety at [employer] provided  Goals:  Goals Addressed             This Visit's Progress    Patient stated       He would like to feel more safe getting in and out of his house and up/down the steps. A railing around porch is recommended and pt to consider ramp at front door (dependent on cost and length of ramp needed to meet code)     Patient Stated       He  would like to feel safer getting down to and up from his laundry room (grab bar on wall to the right as you go up the steps is recommended)     Patient Stated       He would like to feel more safe getting in and out of his tub. (Options are full tub to shower conversion OR a tub cut out, keeping his current grab bars  and handheld shower) and also providing him a shower seat.     Patient Stated       He would like for it to be easier to get his socks on ( a wide sock aid would be of benefit)        Post Clinical Reasoning: Clinician View Of Client Situation:: It was great meeting [name] today. He lives by himself in a one story home that he takes really good care of. He gets out to get groceries, go to appointments, and drive around. He also walks up and down his driveway. He is a late riser. He cooks most of his food (trying to eat more healthy these days). He watches tv and likes to do games on his computer. He is able to do most of his ADLs and house work without any issues. Client View Of His/Her Situation:: [name] feels he does well for himself and I would agree. He feels a few things would make his life easier now and in the future. See goals for these areas. Next Visit Plan:: [place], OTR/L Aging Gracefully [phone]

## 2021-02-08 ENCOUNTER — Other Ambulatory Visit: Payer: Self-pay

## 2021-02-08 NOTE — Patient Outreach (Signed)
Aging Gracefully Program  02/08/2021  [name] [birth year] [id number]  Placed call to patient to schedule home visit.  Patient answered and explained reason for call. Offered home visit for 02/12/2021 at 1030 am and patient accepted.  Confirmed address.  Rowe Pavy RN, BSN, Careers adviser for [employer] Mobile: [phone]

## 2021-02-12 ENCOUNTER — Other Ambulatory Visit: Payer: Self-pay

## 2021-02-12 NOTE — Patient Outreach (Signed)
Aging Gracefully Program  RN Visit  02/12/2021  KADARIOUS RISPER Apr 13, 1955 [id number]  Visit:   Aging Gracefully RN home visit #1  Start Time:   1030 End Time:   1130 Total Minutes:   60  Readiness To Change Score:     Universal RN Interventions: Calendar Distribution: Yes Exercise Review: No Medications: Yes Medication Changes: Yes Mood: Yes Pain: Yes PCP Advocacy/Support: Yes Fall Prevention: Yes Incontinence: Yes Clinician View Of Client Situation: Patient is awake and alert. Ambulating well without any assistive devices. Home neat and clean.  Patient changed his diet 30 days ago and is now having low CBG levels.  Todays CBG of 71 Client View Of His/Her Situation: Patient reports that his biggest concern for his health is his back pain.  Reports that he is working on better DM control. Reports taking all medications as prescribed except his Xarelto.  Reports that he takes it 1-2 times per wee. Reports that he takes aleve for back pain and knows that he can not take both at the same time.   Reports that he self adjust his insulin doses based on his CBG readings.  Reports that he has joined the Y.  Healthcare Provider Communication:patient reports that he will call his doctor and report his CBG levels    Clinician View of Client Situation: Clinician View Of Client Situation: Patient is awake and alert. Ambulating well without any assistive devices. Home neat and clean.  Patient changed his diet 30 days ago and is now having low CBG levels.  Todays CBG of 71 Client's View of His/Her Situation: Client View Of His/Her Situation: Patient reports that his biggest concern for his health is his back pain.  Reports that he is working on better DM control. Reports taking all medications as prescribed except his Xarelto.  Reports that he takes it 1-2 times per wee. Reports that he takes aleve for back pain and knows that he can not take both at the same time.   Reports that he self adjust  his insulin doses based on his CBG readings.  Reports that he has joined the Y.  Medication Assessment: Do You Have Any Problems Paying For Medications?: No Where Does Client Store Medications?:  (bedroom) Can Client Read Pill Bottles?: Yes Does Client Use A Pillbox?: No Does Anyone Assist Client In Taking Medications?: No Do You Take Vitamin D?: No Does Client Have Any Questions Or Concerns About Medictions?: No Is Client Complaining Of Any Symptoms That Could Be Side Effects To Medications?: No Any Possible Changes In Medication Regimen?: No  OT Update: pending home  modifications  Session Summary: Very nice patient who is self managing well.  Reports pain and DM control as concerns.    Goals Addressed               This Visit's Progress     Patient Stated (pt-stated)        Aging Gracefully RN goals:  Goal:  Patient will report decrease in back pain in the next 120 days.  02/12/2021 Assessment:  Patient reports back pain 4/10 today. Reports this is one of his biggest problems.  Reports taking aleve for pain. Interventions: reviewed contraindications of aleve, Goody Powders when taking a blood thinner.  Reviewed use of OTC tylenol.  Encouraged gentle stretching, use of heat and cold therapy.  PLAN: next home visit planned for 03/17/2021  Tomasa Rand RN, BSN, CEN RN Case Manager for [employer] Mobile: [phone]  Patient Stated (pt-stated)        Aging Gracefully RN Goals:  Goal:  Patient will report decrease number of low CBG readings in the next 120 days.  02/12/2021 Assessment: CBG reading of 71 today.  Patient reports low CBG readings daily for the last 30 days.. Patient reports that he has changed his diet and is following his DM diet now.  Patient admits that he self adjust his insulin but continues to have low readings. Interventions: reviewed the use of a bedtime snack with protein to help stabilized blood glucose levels when  he is sleeping. Reviewed importance to inform MD of his CBG levels. Reviewed with patient his treatment of hypoglycemia.  Provided [facility] calendar and education tool for patient to record CBG readings.   Plan: Next home visit planned for 03/17/2021  Tomasa Rand RN, BSN, CEN RN Case Manager for [employer] Mobile: [phone]

## 2021-02-12 NOTE — Patient Instructions (Signed)
Goals Addressed               This Visit's Progress     Patient Stated (pt-stated)        Aging Gracefully RN goals:  Goal:  Patient will report decrease in back pain in the next 120 days.  02/12/2021 Assessment:  Patient reports back pain 4/10 today. Reports this is one of his biggest problems.  Reports taking aleve for pain. Interventions: reviewed contraindications of aleve, Goody Powders when taking a blood thinner.  Reviewed use of OTC tylenol.  Encouraged gentle stretching, use of heat and cold therapy.  PLAN: next home visit planned for 03/17/2021  Rowe Pavy RN, BSN, CEN RN Case Manager for A[facility] Mobile: [phone]       Patient Stated (pt-stated)        Aging Gracefully RN Goals:  Goal:  Patient will report decrease number of low CBG readings in the next 120 days.  02/12/2021 Assessment: CBG reading of 71 today.  Patient reports low CBG readings daily for the last 30 days.. Patient reports that he has changed his diet and is following his DM diet now.  Patient admits that he self adjust his insulin but continues to have low readings. Interventions: reviewed the use of a bedtime snack with protein to help stabilized blood glucose levels when he is sleeping. Reviewed importance to inform MD of his CBG levels. Reviewed with patient his treatment of hypoglycemia.  Provided [facility] calendar and education tool for patient to record CBG readings.   Plan: Next home visit planned for 03/17/2021  Rowe Pavy RN, BSN, CEN RN Case Manager for [employer] Mobile: [phone]

## 2021-02-26 ENCOUNTER — Ambulatory Visit (INDEPENDENT_AMBULATORY_CARE_PROVIDER_SITE_OTHER): Payer: Medicare Other

## 2021-02-26 ENCOUNTER — Other Ambulatory Visit: Payer: Self-pay | Admitting: Occupational Therapy

## 2021-02-26 ENCOUNTER — Other Ambulatory Visit: Payer: Self-pay

## 2021-02-26 DIAGNOSIS — I428 Other cardiomyopathies: Secondary | ICD-10-CM

## 2021-02-26 LAB — CUP PACEART REMOTE DEVICE CHECK
Battery Remaining Longevity: 58 mo
Battery Remaining Percentage: 61 %
Battery Voltage: 2.95 V
Brady Statistic AP VP Percent: 5.5 %
Brady Statistic AP VS Percent: 1 %
Brady Statistic AS VP Percent: 92 %
Brady Statistic AS VS Percent: 1.9 %
Brady Statistic RA Percent Paced: 4.6 %
Brady Statistic RV Percent Paced: 97 %
Date Time Interrogation Session: 20230217030249
HighPow Impedance: 46 Ohm
HighPow Impedance: 46 Ohm
Implantable Lead Implant Date: 19961213
Implantable Lead Implant Date: 20111202
Implantable Lead Location: 753859
Implantable Lead Location: 753860
Implantable Lead Model: 5524
Implantable Lead Model: 7121
Implantable Pulse Generator Implant Date: 20200518
Lead Channel Impedance Value: 340 Ohm
Lead Channel Impedance Value: 490 Ohm
Lead Channel Pacing Threshold Amplitude: 0.75 V
Lead Channel Pacing Threshold Amplitude: 0.75 V
Lead Channel Pacing Threshold Pulse Width: 0.5 ms
Lead Channel Pacing Threshold Pulse Width: 0.5 ms
Lead Channel Sensing Intrinsic Amplitude: 2 mV
Lead Channel Sensing Intrinsic Amplitude: 5.3 mV
Lead Channel Setting Pacing Amplitude: 2 V
Lead Channel Setting Pacing Amplitude: 2 V
Lead Channel Setting Pacing Pulse Width: 0.5 ms
Lead Channel Setting Sensing Sensitivity: 0.5 mV
Pulse Gen Serial Number: 9829382

## 2021-02-26 NOTE — Patient Outreach (Signed)
Aging Gracefully Program  OT Follow-Up Visit  02/26/2021  LYNX GOODRICH 1955/01/12 [id number]  Visit:  2- Second Visit  Start Time:  5361 End Time:  1640 Total Minutes:  15     Readiness to Change Score :  Readiness to Change Score:10  Durable Medical Equipment: Durable Medical Equipment: Shower Chair With Back Durable Medical Equipment Distribution Date: 02/26/21   Goals:   Goals Addressed             This Visit's Progress    Patient Stated       He would like to feel more safe getting in and out of his tub. (Options are full tub to shower conversion OR a tub cut out, keeping his current grab bars and handheld shower) and also providing him a shower seat.  Goal is partially met: Shower seat provided  ACTION PLANNING - BATHING  Target Problem Area: Decreased safety with showering  Why Problem May Occur: No shower seat, decreased balance  Target Goal: Safety and independence with showering   STRATEGIES Saving Your Energy: DO: DON'T:  Use a shower seat Minimize standing while bathing, it uses more energy  Keep all items you'll need within easy reach [place]  Modifying your home environment and making it safe: DO: DON'T:  Use grab bars in the shower    Place a rubber mat the size you need in the shower Place loose rugs in the bathroom- they can trip you or your walker/cane can get caught on them  Make sure the bathroom is well lit    Use handheld shower head   Simplifying the way you set up tasks or daily routines: DO: DON'T:  Plan to shower before you're overly tired Rush through [employer] all items before getting started    Practice It is important to practice the strategies so we can determine if they will be effective in helping to reach your goal. Follow these specific recommendations: 1.Use your new shower seat. 2.Use a non-skid mat or non-slip socks (you showed me) when in the shower.  If a strategy does not work the first time,  try it again and again (and maybe again). [place], OTR/L       02/27/2020         Post Clinical Reasoning: Client Action (Goal) Two Interventions: Provided client a shower seat and set it to the correct height for him Did Client Try?: Yes (to make sure correct height) Targeted Problem Area Status: A Lot Better Clinician View Of Client Situation:: Mr. Mcfarren is doing well today. He reports he is still trying to eat healthy and just finished cooking some vegetables. Client View Of His/Her Situation:: Mr. Schleifer was happy to get the shower seat and looks forward to sitting in shower and enjoying the water just running over him. Next Visit Plan:: Looking at how the home modifications are working for him  [place], OTR/L Aging Gracefully [phone]

## 2021-03-03 NOTE — Progress Notes (Signed)
Remote ICD transmission.   

## 2021-03-16 NOTE — Progress Notes (Signed)
Electrophysiology Office Note Date: 03/25/2021  ID:  RONSON DUCHESNEAU, DOB 02-17-1955, MRN [id number]  PCP: Georgann Housekeeper, MD Primary Cardiologist: [name], MD Electrophysiologist: Lewayne Bunting, MD   CC: Routine ICD follow-up  [name] is a 66 y.o. male seen today for Lewayne Bunting, MD for routine electrophysiology followup.  Since last being seen in our clinic the patient reports doing well overall.  he denies chest pain, palpitations, dyspnea, PND, orthopnea, nausea, vomiting, dizziness, syncope, edema, weight gain, or early satiety. He has not had ICD shocks.   Device History: St. Jude BiV ICD implanted 1996, upgrade 2011, gen change 2020 for CHF (LV port capped)   Past Medical History:  Diagnosis Date   Asthma    Automatic implantable cardioverter-defibrillator in situ    Back pain    Chronic systolic CHF (congestive heart failure), NYHA class 3 (HCC)    Complete heart block (HCC)    s/p PPM 1997 with upgrade to AICD 2011   Depression    Dilated aortic root (HCC)    40mm ascending aortic root   DM (diabetes mellitus) (HCC)    Glaucoma    HTN (hypertension)    Hyperlipidemia    Nonischemic cardiomyopathy (HCC)    EF of 15-20% by echo 2015 and 20% by nuclear stress test 2017   Obesity    PAF (paroxysmal atrial fibrillation) (HCC)    13 minutes of PAF documented on ICD check 2018 - anticoagulated with Eliquis   Sarcoid    Sleep apnea 06-28-12   no cpap used-unable to tolerate   SVT (supraventricular tachycardia) (HCC)    s/p ablation   Past Surgical History:  Procedure Laterality Date   COLONOSCOPY WITH PROPOFOL N/A 07/17/2012   Procedure: COLONOSCOPY WITH PROPOFOL;  Surgeon: Charolett Bumpers, MD;  Location: [facility] ENDOSCOPY;  Service: Endoscopy;  Laterality: N/A;   HEMORRHOID SURGERY     early 20's   ICD GENERATOR CHANGEOUT N/A 05/28/2018   Procedure: ICD GENERATOR CHANGEOUT;  Surgeon: Marinus Maw, MD;  Location: [facility] INVASIVE CV LAB;  Service:  Cardiovascular;  Laterality: N/A;   PACEMAKER INSERTION     TONSILLECTOMY      Current Outpatient Medications  Medication Sig Dispense Refill   albuterol (PROVENTIL HFA;VENTOLIN HFA) 108 (90 BASE) MCG/ACT inhaler Inhale 2 puffs into the lungs every 4 (four) hours as needed for wheezing. For shortness of breath     furosemide (LASIX) 40 MG tablet Take 1 tablet (40 mg total) by mouth daily. 90 tablet 3   JARDIANCE 25 MG TABS tablet Take 25 mg by mouth daily.     metoprolol succinate (TOPROL-XL) 50 MG 24 hr tablet Take 1 tablet (50 mg total) by mouth daily. Take with or immediately following a meal. 90 tablet 3   OZEMPIC, 0.25 OR 0.5 MG/DOSE, 2 MG/1.5ML SOPN Inject 1 mg into the skin once a week.     rivaroxaban (XARELTO) 20 MG TABS tablet Take 1 tablet (20 mg total) by mouth daily with supper. 30 tablet 11   sacubitril-valsartan (ENTRESTO) 97-103 MG Take 1 tablet by mouth 2 (two) times daily. 180 tablet 3   simvastatin (ZOCOR) 40 MG tablet Take 40 mg by mouth every evening.     spironolactone (ALDACTONE) 25 MG tablet Take 1 tablet (25 mg total) by mouth daily. 90 tablet 3   SYMBICORT 80-4.5 MCG/ACT inhaler Inhale 2 puffs into the lungs 2 (two) times a day.     TRESIBA FLEXTOUCH 200 UNIT/ML SOPN  Inject 80 Units into the skin every morning.     No current facility-administered medications for this visit.   Facility-Administered Medications Ordered in Other Visits  Medication Dose Route Frequency Provider Last Rate Last Admin   technetium pyrophosphate Tc 84m injection 21.1 millicurie  21.1 millicurie Intravenous Once Lars Masson, MD        Allergies:   Penicillins and Penicillin g benzathine   Social History: Social History   Socioeconomic History   Marital status: Legally Separated    Spouse name: Not on file   Number of children: 2   Years of education: GED   Highest education level: Not on file  Occupational History   Not on file  Tobacco Use   Smoking status: Never    Smokeless tobacco: Never  Vaping Use   Vaping Use: Never used  Substance and Sexual Activity   Alcohol use: Not Currently    Comment: occasionally   Drug use: No   Sexual activity: Yes  Other Topics Concern   Not on file  Social History Narrative   Not on file   Social Determinants of Health   Financial Resource Strain: Not on file  Food Insecurity: Not on file  Transportation Needs: Not on file  Physical Activity: Not on file  Stress: Not on file  Social Connections: Not on file  Intimate Partner Violence: Not on file    Family History: Family History  Problem Relation Age of Onset   Diabetes Mother    Alzheimer's disease Mother    CVA Father    Heart failure Father    Epilepsy Sister    Diabetes Brother    Diabetes Sister     Review of Systems: All other systems reviewed and are otherwise negative except as noted above.   Physical Exam: Vitals:   03/25/21 1121  BP: 122/72  Pulse: (!) 182  SpO2: 93%  Weight: (!) 351 lb (159.2 kg)  Height: 5\' 10"  (1.778 m)     GEN- The patient is well appearing, alert and oriented x 3 today.   HEENT: normocephalic, atraumatic; sclera clear, conjunctiva pink; hearing intact; oropharynx clear; neck supple, no JVP Lymph- no cervical lymphadenopathy Lungs- Clear to ausculation bilaterally, normal work of breathing.  No wheezes, rales, rhonchi Heart- Regular rate and rhythm, no murmurs, rubs or gallops, PMI not laterally displaced GI- soft, non-tender, non-distended, bowel sounds present, no hepatosplenomegaly Extremities- no clubbing or cyanosis. No edema; DP/PT/radial pulses 2+ bilaterally MS- no significant deformity or atrophy Skin- warm and dry, no rash or lesion; ICD pocket well healed Psych- euthymic mood, full affect Neuro- strength and sensation are intact  ICD interrogation- reviewed in detail today,  See PACEART report  EKG:  EKG is ordered today. Personal review of EKG ordered today shows pacing induced LBBB at  ~86 bpm (Auto calculate over measured due to very large voltage)  Recent Labs: 06/18/2020: BUN 10; Creatinine, Ser 1.18; Hemoglobin 12.4; Platelets 346; Potassium 4.7; Sodium 140   Wt Readings from Last 3 Encounters:  03/25/21 (!) 351 lb (159.2 kg)  01/19/21 (!) 349 lb (158.3 kg)  06/18/20 (!) 358 lb 6.4 oz (162.6 kg)     Other studies Reviewed: Additional studies/ records that were reviewed today include: Previous EP office notes.   Assessment and Plan:  1.  Chronic systolic dysfunction s/p St. Jude Dual Chamber ICD  euvolemic today. Reports NYHA II symptoms.  Stable on an appropriate medical regimen Normal ICD function See Pace Art report No  changes today EF remains low on Echo 01/2021. He has previously been offered CRT upgrade and has declined. Declines earlier visit with Dr. Ladona Ridgel to re-visit.   2. Morbid obesity Body mass index is 50.36 kg/m.   3. Atrial tachycardia Burden low on device.   Current medicines are reviewed at length with the patient today.    Labs/ tests ordered today include:  Orders Placed This Encounter  Procedures   Basic metabolic panel   CBC   EKG 12-Lead    Disposition:   Follow up with Dr. Ladona Ridgel in 6 months    Signed, Graciella Freer, PA-C  03/25/2021 11:26 AM  [facility] [address] [place] [address] [phone] (office) [phone] (fax)

## 2021-03-17 ENCOUNTER — Other Ambulatory Visit: Payer: Self-pay

## 2021-03-19 NOTE — Patient Outreach (Signed)
Aging Gracefully Program ? ?RN Visit ? ?03/17/2021 ? ?[name] ?1955/12/06 ?[id number] ? ?Visit:   RN Home visit #2 ? ?Start Time:   1345 ?End Time:   1415 ?Total Minutes:   30 ? ?Readiness To Change Score:    ? ?Universal RN Interventions: ?Calendar Distribution: Yes ?Exercise Review: Yes ?Medications: Yes ?Medication Changes: Yes ?Mood: Yes ?Pain: Yes ?PCP Advocacy/Support: Yes ?Incontinence: Yes ?Clinician View Of Client Situation: Patient is awake and alert. Ambulatory to the door without any assistive devices.  Home neat and clean.  CBG today 94 ?Client View Of His/Her Situation: Patient reports that he has been doing well. States that [facility] not [facility]  Reports that he has continued to have low CBG .  9 episodes since my last visit from 67-77.  Reports that he can not tell when his CBG is low.  Reports that he has continued to adjust his insulin based on his CBG level.   Reports he continues to have back pain. Reports that [employer] have been signed and work to start soon.  denies any depression today. ? ?Healthcare Provider Communication: ?Did Surveyor, mining With [employer] Provider?: No ?According to Client, Did PCP Report Communication With An Aging Gracefully RN?: No ? ?Clinician View of Client Situation: ?Clinician View Of Client Situation: Patient is awake and alert. Ambulatory to the door without any assistive devices.  Home neat and clean.  CBG today 94 ?Client's View of His/Her Situation: ?Client View Of His/Her Situation: Patient reports that he has been doing well. States that he has not been to the [facility].  Reports that he has continued to have low CBG .  9 episodes since my last visit from 67-77.  Reports that he can not tell when his CBG is low.  Reports that he has continued to adjust his insulin based on his CBG level.   Reports he continues to have back pain. Reports that [employer] have been signed and work to start soon.  denies any  depression today. ?  ?Medication Assessment:  denies changes ?  ? ?OT Update: pending home modifications ? ?Session Summary: Patient is doing well with no new complaints. ? ? Goals Addressed   ? ?  ?  ?  ?  ?  ? This Visit's Progress  ?   Patient Stated (pt-stated)     ?   Aging Gracefully RN goals: ? ?Goal:  Patient will report decrease in back pain in the next 120 days. ? ?02/12/2021 ?Assessment:  Patient reports back pain 4/10 today. Reports this is one of his biggest problems.  Reports taking aleve for pain. ?Interventions: reviewed contraindications of aleve, Goody Powders when taking a blood thinner.  Reviewed use of OTC tylenol.  Encouraged gentle stretching, use of heat and cold therapy.  ?PLAN: next home visit planned for 03/17/2021 ? ?Rowe Pavy RN, BSN, CEN ?RN Case Manager for Aging Gracefully ?[facility] Customer service manager ?Mobile: [phone]  ? ?03/082023 ?Assessment:  Patient continues to have lower back pain.  No falls. ? ?Interventions:  provided a written exercise plan and demonstrated how to do home exercises. Encouraged patient to do this exercises daily. ? ?PLAN: next home visit planned for 6 weeks. ? ?Rowe Pavy RN, BSN, CEN ?RN Case Manager for Aging Gracefully ?[facility] Customer service manager ?Mobile: [phone]  ?  ? ?  ? Rowe Pavy RN, BSN, CEN ?RN Case Manager for Aging Gracefully ?[facility] Customer service manager ?Mobile: [phone]  ? ? ?

## 2021-03-19 NOTE — Patient Instructions (Signed)
Visit Information ? ? Goals Addressed   ? ?  ?  ?  ?  ?  ? This Visit's Progress  ?   Patient Stated (pt-stated)     ?   Aging Gracefully RN goals: ? ?Goal:  Patient will report decrease in back pain in the next 120 days. ? ?02/12/2021 ?Assessment:  Patient reports back pain 4/10 today. Reports this is one of his biggest problems.  Reports taking aleve for pain. ?Interventions: reviewed contraindications of aleve, Goody Powders when taking a blood thinner.  Reviewed use of OTC tylenol.  Encouraged gentle stretching, use of heat and cold therapy.  ?PLAN: next home visit planned for 03/17/2021 ? ?Rowe Pavy RN, BSN, CEN ?RN Case Manager for Aging Gracefully ?[facility] Customer service manager ?Mobile: [phone]  ? ?03/082023 ?Assessment:  Patient continues to have lower back pain.  No falls. ? ?Interventions:  provided a written exercise plan and demonstrated how to do home exercises. Encouraged patient to do this exercises daily. ? ?PLAN: next home visit planned for 6 weeks. ? ?Rowe Pavy RN, BSN, CEN ?RN Case Manager for Aging Gracefully ?[facility] Customer service manager ?Mobile: [phone]  ?  ? ?  ?  ?

## 2021-03-23 ENCOUNTER — Other Ambulatory Visit: Payer: Self-pay | Admitting: Occupational Therapy

## 2021-03-24 ENCOUNTER — Other Ambulatory Visit: Payer: Self-pay

## 2021-03-24 NOTE — Patient Outreach (Signed)
Aging Gracefully Program ? ?OT Follow-Up Visit ? ?03/24/2021 ? ?[name] ?[birth year] ?[id number] ? ?Visit:  3- Third Visit ? ?Start Time:  1400 ?End Time:  1425 ?Total Minutes:  25 ? ?Readiness to Change Score :  Readiness to Change Score: 10 ? ?Durable Medical Equipment: ?Adaptive Equipment: Sock Aid (wide) ?Adaptive Equipment Distribution Date: 03/23/21 ? ? ?Goals:  ? Goals Addressed   ? ?  ?  ?  ?  ? This Visit's Progress  ?  COMPLETED: Patient Stated     ?  He would like for it to be easier to get his socks on ( a wide sock aid would be of benefit). Client given wide sock aid her practiced with it. ? ?ACTION PLANNING - DRESSING/BATHING ?Target Problem Area: ?Difficulty with  lower body dressing   ?Why Problem May Occur: ?Decreased flexibility  ?Target Goal: ?Independence with getting socks on  ?STRATEGIES ?Saving Your Energy: ?DO: DON'T:  ?Sit down to dress lower body Stand to dress lower body  ?Use appropriate adaptive equipment:  sock aid   ?Keep all items you'll need within easy reach   ?Simplifying the way you set up tasks or daily routines: ?DO: DON'T:  ?Wear sturdy shoes that grip the floor and have a back Wear scuffs or flip flops  ?Gather all items before getting started   ?Wear clothing that is easily manipulated Wear clothes you can get tripped up on.  ?Practice ?It is important to practice the strategies so we can determine if they will be effective in helping to reach your goal. ?Follow these specific recommendations: ?1.Use the sock aid  to get socks on, the more you use it the more you will get used to it and it will get easier. ? ?If a strategy does not work the first time, try it again and again (and maybe again). ?We may make some changes over the next few sessions, based on how they work. ? ?[name], OTR/L        03/23/2021 ?  ? ?  ? ? ?Post Clinical Reasoning: ?Client Action (Goal) Three Interventions: Client given wide sock aid and he practiced with it ?Did Client Try?:  Yes ?Targeted Problem Area Status: A Little Better ?Clinician View Of Client Situation:: [name] continues to do well for himself. He reports that the tub seat I gave him will not fit at the back of his tub due to the closer roundness of both sides of this tub, but will sit in middle of tub. It should not be an issue at all to put it more towards the back once the shower conversion is complete. He was thankful for the wide sock aide. He is having some cardiac tests done this week. ?Client View Of His/Her Situation:: He is excited about getting his tub converted to shower and says it might get started at the first of the month. He is happy with his doctors and at the tests they are running to make sure he keeps his health as good as possible.He continues to eat healthy. ?Next Visit Plan:: Look at how modifications have turned out and give him "Tips for Aging at Home book" ? ?[name], OTR/L ?Aging Gracefully ?[phone] ? ? ?

## 2021-03-24 NOTE — Patient Instructions (Signed)
He would like for it to be easier to get his socks on ( a wide sock aid would be of benefit). Client given wide sock aid her practiced with it. ? ?ACTION PLANNING - DRESSING/BATHING ?Target Problem Area: ?Difficulty with  lower body dressing   ?Why Problem May Occur: ?Decreased flexibility  ?Target Goal: ?Independence with getting socks on  ?STRATEGIES ?Saving Your Energy: ?DO: DON'T:  ?Sit down to dress lower body Stand to dress lower body  ?Use appropriate adaptive equipment:  sock aid   ?Keep all items you'll need within easy reach   ? ?Simplifying the way you set up tasks or daily routines: ?DO: DON'T:  ?Wear sturdy shoes that grip the floor and have a back Wear scuffs or flip flops  ?Gather all items before getting started   ?Wear clothing that is easily manipulated Wear clothes you can get tripped up on.  ? ?Practice ?It is important to practice the strategies so we can determine if they will be effective in helping to reach your goal. ?Follow these specific recommendations: ?1.Use the sock aid  to get socks on, the more you use it the more you will get used to it and it will get easier. ? ?If a strategy does not work the first time, try it again and again (and maybe again). ?We may make some changes over the next few sessions, based on how they work. ? ?Ignacia Palma, OTR/L        03/23/2021 ?

## 2021-03-25 ENCOUNTER — Ambulatory Visit: Payer: Medicare Other | Admitting: Student

## 2021-03-25 ENCOUNTER — Encounter: Payer: Self-pay | Admitting: Student

## 2021-03-25 ENCOUNTER — Other Ambulatory Visit (HOSPITAL_COMMUNITY): Payer: Self-pay | Admitting: *Deleted

## 2021-03-25 ENCOUNTER — Ambulatory Visit (HOSPITAL_COMMUNITY)
Admission: RE | Admit: 2021-03-25 | Discharge: 2021-03-25 | Disposition: A | Discharge status: Home / Self Care | Payer: Medicare Other | Source: Ambulatory Visit | Attending: Internal Medicine | Admitting: Internal Medicine

## 2021-03-25 VITALS — BP 122/72 | HR 86 | Ht 70.0 in | Wt 351.0 lb

## 2021-03-25 VITALS — BP 108/70 | HR 98 | Wt 348.1 lb

## 2021-03-25 DIAGNOSIS — R0683 Snoring: Secondary | ICD-10-CM | POA: Diagnosis not present

## 2021-03-25 DIAGNOSIS — I5022 Chronic systolic (congestive) heart failure: Secondary | ICD-10-CM

## 2021-03-25 DIAGNOSIS — I480 Paroxysmal atrial fibrillation: Secondary | ICD-10-CM

## 2021-03-25 DIAGNOSIS — I442 Atrioventricular block, complete: Secondary | ICD-10-CM

## 2021-03-25 DIAGNOSIS — G4730 Sleep apnea, unspecified: Secondary | ICD-10-CM | POA: Diagnosis not present

## 2021-03-25 DIAGNOSIS — Z95810 Presence of automatic (implantable) cardiac defibrillator: Secondary | ICD-10-CM

## 2021-03-25 DIAGNOSIS — I428 Other cardiomyopathies: Secondary | ICD-10-CM | POA: Insufficient documentation

## 2021-03-25 LAB — CUP PACEART INCLINIC DEVICE CHECK
Battery Remaining Longevity: 56 mo
Brady Statistic RA Percent Paced: 5 %
Brady Statistic RV Percent Paced: 97 %
Date Time Interrogation Session: 20230316114258
HighPow Impedance: 48.5329
Implantable Lead Implant Date: 19961213
Implantable Lead Implant Date: 20111202
Implantable Lead Location: 753859
Implantable Lead Location: 753860
Implantable Lead Model: 5524
Implantable Lead Model: 7121
Implantable Pulse Generator Implant Date: 20200518
Lead Channel Impedance Value: 375 Ohm
Lead Channel Impedance Value: 512.5 Ohm
Lead Channel Pacing Threshold Amplitude: 0.75 V
Lead Channel Pacing Threshold Pulse Width: 0.5 ms
Lead Channel Sensing Intrinsic Amplitude: 2 mV
Lead Channel Sensing Intrinsic Amplitude: 6.8 mV
Lead Channel Setting Pacing Amplitude: 2 V
Lead Channel Setting Pacing Amplitude: 2 V
Lead Channel Setting Pacing Pulse Width: 0.5 ms
Lead Channel Setting Sensing Sensitivity: 0.5 mV
Pulse Gen Serial Number: 9829382

## 2021-03-25 LAB — CBC
Hematocrit: 45 % (ref 37.5–51.0)
Hemoglobin: 14.8 g/dL (ref 13.0–17.7)
MCH: 28.5 pg (ref 26.6–33.0)
MCHC: 32.9 g/dL (ref 31.5–35.7)
MCV: 87 fL (ref 79–97)
Platelets: 275 10*3/uL (ref 150–450)
RBC: 5.2 x10E6/uL (ref 4.14–5.80)
RDW: 13.2 % (ref 11.6–15.4)
WBC: 6.9 10*3/uL (ref 3.4–10.8)

## 2021-03-25 LAB — BASIC METABOLIC PANEL
BUN/Creatinine Ratio: 8 — ABNORMAL LOW (ref 10–24)
BUN: 7 mg/dL — ABNORMAL LOW (ref 8–27)
CO2: 20 mmol/L (ref 20–29)
Calcium: 10.3 mg/dL — ABNORMAL HIGH (ref 8.6–10.2)
Chloride: 107 mmol/L — ABNORMAL HIGH (ref 96–106)
Creatinine, Ser: 0.88 mg/dL (ref 0.76–1.27)
Glucose: 139 mg/dL — ABNORMAL HIGH (ref 70–99)
Potassium: 4.6 mmol/L (ref 3.5–5.2)
Sodium: 141 mmol/L (ref 134–144)
eGFR: 95 mL/min/{1.73_m2} (ref >59)

## 2021-03-25 NOTE — Progress Notes (Signed)
Height:  5'10"    ?Weight: 348 lbs ?BMI: 49.9 ? ?Today's Date: 03/25/21 ? ?STOP BANG RISK ASSESSMENT ?S (snore) Have you been told that you snore?     YES ?  ?T (tired) Are you often tired, fatigued, or sleepy during the day?  ? YES  ?O (obstruction) Do you stop breathing, choke, or gasp during sleep? NO ?  ?P (pressure) Do you have or are you being treated for high blood pressure? YES ?  ?B (BMI) Is your body index greater than 35 kg/m? YES ?  ?A (age) Are you 36 years old or older? YES ?  ?N (neck) Do you have a neck circumference greater than 16 inches?  ?  ?  ?G (gender) Are you a male? YES ?  ?TOTAL STOP/BANG ?YES? ANSWERS 6  ? ?                                                                    For Office Use Only              Procedure Order Form    ?YES to 3+ Stop Bang questions OR two clinical symptoms - patient qualifies for WatchPAT (CPT 95800)     ? ?Clinical Notes: Will consult Sleep Specialist and refer for management of therapy due to patient increased risk of Sleep Apnea. Ordering a sleep study due to the following two clinical symptoms: Excessive daytime sleepiness G47.10 /  Loud snoring R06.83  ? ? ? ? ?

## 2021-03-25 NOTE — Patient Instructions (Signed)
Medication Instructions:  ?Your physician recommends that you continue on your current medications as directed. Please refer to the Current Medication list given to you today. ? ?*If you need a refill on your cardiac medications before your next appointment, please call your pharmacy* ? ? ?Lab Work: ?TODAY: BMET, CBC ? ?If you have labs (blood work) drawn today and your tests are completely normal, you will receive your results only by: ?MyChart Message (if you have MyChart) OR ?A paper copy in the mail ?If you have any lab test that is abnormal or we need to change your treatment, we will call you to review the results. ? ? ?Follow-Up: ?At [facility], you and your health needs are our priority.  As part of our continuing mission to provide you with exceptional heart care, we have created designated Provider Care Teams.  These Care Teams include your primary Cardiologist (physician) and Advanced Practice Providers (APPs -  Physician Assistants and Nurse Practitioners) who all work together to provide you with the care you need, when you need it. ? ?Your next appointment:   ?6 month(s) ? ?The format for your next appointment:   ?In Person ? ?Provider:   ?Gregg Taylor, MD   ?

## 2021-03-25 NOTE — Patient Instructions (Addendum)
Medication Changes: ? ?None ? ?Lab Work: ? ?None ? ?Testing/Procedures: ? ?Your provider has recommended that you have a home sleep study.  We have provided you with the equipment in our office today. Please download the app and follow the instructions. YOUR PIN NUMBER IS: [id number]. Once you have completed the test you just dispose of the equipment, the information is automatically uploaded to [place] via blue-tooth technology. If your test is positive for sleep apnea and you need a home CPAP machine you will be contacted by Dr Norris Cross office [facility]) to set this up. ? ?Your physician has requested that you have a cardiac catheterization. Cardiac catheterization is used to diagnose and/or treat various heart conditions. Doctors may recommend this procedure for a number of different reasons. The most common reason is to evaluate chest pain. Chest pain can be a symptom of coronary artery disease (CAD), and cardiac catheterization can show whether plaque is narrowing or blocking your heart?s arteries. This procedure is also used to evaluate the valves, as well as measure the blood flow and oxygen levels in different parts of your heart. For further information please visit [email or web address] Please follow instruction sheet, as given. SEE INSTRUCTIONS BELOW ? ?Referrals: ? ?None ? ?Special Instructions // Education: ? ?Do the following things EVERYDAY: ?Weigh yourself in the morning before breakfast. Write it down and keep it in a log. ?Take your medicines as prescribed ?Eat low salt foods--Limit salt (sodium) to 2000 mg per day.  ?Stay as active as you can everyday ?Limit all fluids for the day to less than 2 liters ? ? ?Follow-Up in: 2-3 months ? ?At the [facility], you and your health needs are our priority. We have a designated team specialized in the treatment of Heart Failure. This Care Team includes your primary Heart Failure Specialized Cardiologist (physician), Advanced Practice Providers  (APPs- Physician Assistants and Nurse Practitioners), and Pharmacist who all work together to provide you with the care you need, when you need it.  ? ?You may see any of the following providers on your designated Care Team at your next follow up: ? ?Dr Arvilla Meres ?Dr Marca Ancona ?Tonye Becket, NP ?Robbie Lis, PA ?Jessica Milford,NP ?Anna Genre, PA ?Karle Plumber, PharmD ? ? ?Please be sure to bring in all your medications bottles to every appointment.  ? ?Need to Contact us: ? ?If you have any questions or concerns before your next appointment please send [place] a message through [place] or call our office at [phone].   ? ?TO LEAVE A MESSAGE FOR THE NURSE SELECT OPTION 2, PLEASE LEAVE A MESSAGE INCLUDING: ?YOUR NAME ?DATE OF BIRTH ?CALL BACK NUMBER ?REASON FOR CALL**this is important as we prioritize the call backs ? ?YOU WILL RECEIVE A CALL BACK THE SAME DAY AS LONG AS YOU CALL BEFORE 4:00 PM ? ? ? ?CATHETERIZATION INSTRUCTIONS: ? ?You are scheduled for a Cardiac Catheterization on Wednesday, April 5 with Dr. Arvilla Meres. ? ?1. Please arrive at the Main Entrance A at [facility]: [address] [place], [place] [address] at 9:30 AM (This time is two hours before your procedure to ensure your preparation). Free valet parking service is available.  ? ?Special note: Every effort is made to have your procedure done on time. Please understand that emergencies sometimes delay scheduled procedures. ? ?2. Diet: Do not eat solid foods after midnight.  You may have clear liquids until 5 AM upon the day of the procedure. ? ?3. Labs: WILL  BE DONE 4/5 AM AT HOSPITAL ? ?4. Medication instructions in preparation for your procedure: ? ? DO NOT TAKE XARELTO Monday 4/3 OR Tuesday 4/4 ? ?  Wednesday 04/14/21 AM DO NOT TAKE FUROSEMIDE, JARDIANCE, OR TRESIBA ? ?On the morning of your procedure, take any morning medicines NOT listed above.  You may use sips of water. ? ?5. Plan to go home the same day, you will  only stay overnight if medically necessary. ?6. You MUST have a responsible adult to drive you home. ?7. An adult MUST be with you the first 24 hours after you arrive home. ?8. Bring a current list of your medications, and the last time and date medication taken. ?9. Bring ID and current insurance cards. ?10.Please wear clothes that are easy to get on and off and wear slip-on shoes. ? ?Thank you for allowing [place] to care for you! ?  -- [facility] ? ?

## 2021-03-25 NOTE — Progress Notes (Signed)
CSW completed SDOH screen with patient during clinic visit- no SDOH concerns expressed at this time. ? ?Burna Sis, LCSW ?Clinical Social Worker ?[facility] ?Desk#: [phone] ?Cell#: [phone] ? ?

## 2021-03-25 NOTE — H&P (View-Only) (Signed)
? ?ADVANCED HF CLINIC CONSULT NOTE ? ?Referring Physician:Traci Turner, MD ?Primary Care: Husain, Karrar, MD ?Primary Cardiologist: Traci Turner, MD ? ? ?HPI: ? ?[name] is a 66 y.o. male with a obesity, systolic HF due to NICM (normal coronary arteries by cath 1996), pulmonary sarcoidosis (biopsy proven), DM2, HTN,  OSA (intolerant of CPAP), CHB s/p dual chamber AICD and SVT s/p ablation.  Referred by Dr. Turner for further evaluation of his HF.  ? ?Diagnosed with pulmonary sarcoid in 1992 or 1993 (biopsy proven). Treated with steroids for several months. Told it was burned out. ? ?Had SVT ablation c/b CHB and underwent PPM placement  ? ?Had cath in 1996. Normal coronary arteries.   ? ?Myoview 2011 EF 28% large inferior scar ?Echo 2013 EF 35-40%  ?Echo 2015 15-20%  ?Myoview 2017. EF 20% large scar ?Echo 2019 EF 15-20% ?Echo 1/23 EF 15-20%  ? ?St. Jude BiV ICD implanted 1996, upgrade 2011, gen change 2020 for CHF (LV port capped). He has previously been offered CRT upgrade and has declined ? ?Here with his wife. Says over the past 3 months has been feeling better. Able to go store and take his time and walk around. Before that says he was getting SOB with any activity. Attributes the change to losing 20 pounds over the past 6 months. Denies CP, edema, orthopnea or PND. + heavy snoring. Takes lasix 40mg daily. Om xarelto for PAF  ? ? ?Review of Systems: [y] = yes, [ ] = no  ? ?General: Weight gain [ ]; Weight loss [ ]; Anorexia [ ]; Fatigue [ ]; Fever [ ]; Chills [ ]; Weakness [ ]  ?Cardiac: Chest pain/pressure [ ]; Resting SOB [ ]; Exertional SOB [ y]; Orthopnea [ ]; Pedal Edema [ ]; Palpitations [ ]; Syncope [ ]; Presyncope [ ]; Paroxysmal nocturnal dyspnea[ ]  ?Pulmonary: Cough [ ]; Wheezing[ ]; Hemoptysis[ ]; Sputum [ ]; Snoring [y ]  ?GI: Vomiting[ ]; Dysphagia[ ]; Melena[ ]; Hematochezia [ ]; Heartburn[ ]; Abdominal pain [ ]; Constipation [ ]; Diarrhea [ ]; BRBPR [ ]  ?GU: Hematuria[ ]; Dysuria [ ];  Nocturia[ ]  ?Vascular: Pain in legs with walking [ ]; Pain in feet with lying flat [ ]; Non-healing sores [ ]; Stroke [ ]; TIA [ ]; Slurred speech [ ];  ?Neuro: Headaches[ ]; Vertigo[ ]; Seizures[ ]; Paresthesias[ ];Blurred vision [ ]; Diplopia [ ]; Vision changes [ ]  ?Ortho/Skin: Arthritis [ y]; Joint pain [ y]; Muscle pain [ ]; Joint swelling [ ]; Back Pain [ ]; Rash [ ]  ?Psych: Depression[ ]; Anxiety[ ]  ?Heme: Bleeding problems [ ]; Clotting disorders [ ]; Anemia [ ]  ?Endocrine: Diabetes [y ]; Thyroid dysfunction[ ] ? ? ?Past Medical History:  ?Diagnosis Date  ? Asthma   ? Automatic implantable cardioverter-defibrillator in situ   ? Back pain   ? Chronic systolic CHF (congestive heart failure), NYHA class 3 (HCC)   ? Complete heart block (HCC)   ? s/p PPM 1997 with upgrade to AICD 2011  ? Depression   ? Dilated aortic root (HCC)   ? 40mm ascending aortic root  ? DM (diabetes mellitus) (HCC)   ? Glaucoma   ? HTN (hypertension)   ? Hyperlipidemia   ? Nonischemic cardiomyopathy (HCC)   ? EF of 15-20% by echo 2015 and 20% by nuclear stress test 2017  ? Obesity   ? PAF (paroxysmal atrial fibrillation) (HCC)   ? 13   minutes of PAF documented on ICD check 2018 - anticoagulated with Eliquis  ? Sarcoid   ? Sleep apnea 06-28-12  ? no cpap used-unable to tolerate  ? SVT (supraventricular tachycardia) (HCC)   ? s/p ablation  ? ? ?Current Outpatient Medications  ?Medication Sig Dispense Refill  ? albuterol (PROVENTIL HFA;VENTOLIN HFA) 108 (90 BASE) MCG/ACT inhaler Inhale 2 puffs into the lungs every 4 (four) hours as needed for wheezing. For shortness of breath    ? furosemide (LASIX) 40 MG tablet Take 1 tablet (40 mg total) by mouth daily. 90 tablet 3  ? JARDIANCE 25 MG TABS tablet Take 25 mg by mouth daily.    ? metoprolol succinate (TOPROL-XL) 50 MG 24 hr tablet Take 1 tablet (50 mg total) by mouth daily. Take with or immediately following a meal. 90 tablet 3  ? OZEMPIC, 0.25 OR 0.5 MG/DOSE, 2 MG/1.5ML SOPN Inject 1 mg  into the skin once a week.    ? rivaroxaban (XARELTO) 20 MG TABS tablet Take 1 tablet (20 mg total) by mouth daily with supper. 30 tablet 11  ? sacubitril-valsartan (ENTRESTO) 97-103 MG Take 1 tablet by mouth 2 (two) times daily. 180 tablet 3  ? simvastatin (ZOCOR) 40 MG tablet Take 40 mg by mouth every evening.    ? spironolactone (ALDACTONE) 25 MG tablet Take 1 tablet (25 mg total) by mouth daily. 90 tablet 3  ? SYMBICORT 80-4.5 MCG/ACT inhaler Inhale 2 puffs into the lungs 2 (two) times a day.    ? TRESIBA FLEXTOUCH 200 UNIT/ML SOPN Inject 80 Units into the skin every morning.    ? ?No current facility-administered medications for this encounter.  ? ?Facility-Administered Medications Ordered in Other Encounters  ?Medication Dose Route Frequency Provider Last Rate Last Admin  ? technetium pyrophosphate Tc 99m injection 21.1 millicurie  21.1 millicurie Intravenous Once Nelson, Katarina H, MD      ? ? ?Allergies  ?Allergen Reactions  ? Penicillins Other (See Comments)  ?  Dizziness ?Did it involve swelling of the face/tongue/throat, SOB, or low BP? Yes ?Did it involve sudden or severe rash/hives, skin peeling, or any reaction on the inside of your mouth or nose? No ?Did you need to seek medical attention at a hospital or doctor's office? Yes ?When did it last happen?      childhood ?If all above answers are ?NO?, may proceed with cephalosporin use. ?  ? Penicillin G Benzathine Other (See Comments)  ? ? ?  ?Social History  ? ?Socioeconomic History  ? Marital status: Legally Separated  ?  Spouse name: Not on file  ? Number of children: 2  ? Years of education: GED  ? Highest education level: Not on file  ?Occupational History  ? Not on file  ?Tobacco Use  ? Smoking status: Never  ? Smokeless tobacco: Never  ?Vaping Use  ? Vaping Use: Never used  ?Substance and Sexual Activity  ? Alcohol use: Not Currently  ?  Comment: occasionally  ? Drug use: No  ? Sexual activity: Yes  ?Other Topics Concern  ? Not on file  ?Social  History Narrative  ? Not on file  ? ?Social Determinants of Health  ? ?Financial Resource Strain: Low Risk   ? Difficulty of Paying Living Expenses: Not very hard  ?Food Insecurity: No Food Insecurity  ? Worried About Running Out of Food in the Last Year: Never true  ? Ran Out of Food in the Last Year: Never true  ?Transportation Needs:   No Transportation Needs  ? Lack of Transportation (Medical): No  ? Lack of Transportation (Non-Medical): No  ?Physical Activity: Not on file  ?Stress: Not on file  ?Social Connections: Not on file  ?Intimate Partner Violence: Not on file  ? ? ?  ?Family History  ?Problem Relation Age of Onset  ? Diabetes Mother   ? Alzheimer's disease Mother   ? CVA Father   ? Heart failure Father   ? Epilepsy Sister   ? Diabetes Brother   ? Diabetes Sister   ? ? ?Vitals:  ? 03/25/21 1347  ?BP: 108/70  ?Pulse: 98  ?SpO2: 96%  ?Weight: (!) 157.9 kg (348 lb 2 oz)  ? ?Filed Weights  ? 03/25/21 1347  ?Weight: (!) 157.9 kg (348 lb 2 oz)  ?  ?PHYSICAL EXAM: ?General:  Well appearing. No respiratory difficulty ?HEENT: normal ?Neck: supple. no JVD. Carotids 2+ bilat; no bruits. No lymphadenopathy or thryomegaly appreciated. ?Cor: PMI nondisplaced. Regular rate & rhythm. No rubs, gallops or murmurs. ?Lungs: clear ?Abdomen: soft, nontender, nondistended. No hepatosplenomegaly. No bruits or masses. Good bowel sounds. ?Extremities: no cyanosis, clubbing, rash, edema ?Neuro: alert & oriented x 3, cranial nerves grossly intact. moves all 4 extremities w/o difficulty. Affect pleasant. ? ?ECG: NSR 76 with v-pacing and very wide QRS > 170ms Personally reviewed ? ? ?ASSESSMENT & PLAN: ? ?1.  Chronic systolic CHF  ?- longstanding LV dysfunction ?- Etiology remains unclear ?- last cath 1996 with normal coronaries however subsequent Myoview have suggested possible underlying iHD - which I would suspect given his CRFs. Also has chronic RV pacing ?- Improved NYHA II-III Volume status looks good  ?- s/p SJ BiVICD (LV lead  capped) - has refused upgrade to CRT ?- Continue Entresto 97-103mg BID ?- Continue Toprol XL 50mg daily ?- Continue Spiro 25mg daily ?- Continue Jardiance 25mg daily ?- Continue Lasix 40mg daily  ?- Wi

## 2021-03-25 NOTE — Progress Notes (Addendum)
? ?ADVANCED HF CLINIC CONSULT NOTE ? ?Referring Physician:Traci Radford Pax, MD ?Primary Care: Wenda Low, MD ?Primary Cardiologist: Fransico Him, MD ? ? ?HPI: ? ?[name] is a 66 y.o. male with a obesity, systolic HF due to NICM (normal coronary arteries by cath 1996), pulmonary sarcoidosis (biopsy proven), DM2, HTN,  OSA (intolerant of CPAP), CHB s/p dual chamber AICD and SVT s/p ablation.  Referred by Dr. Radford Pax for further evaluation of his HF.  ? ?Diagnosed with pulmonary sarcoid in 1992 or 1993 (biopsy proven). Treated with steroids for several months. Told it was burned out. ? ?Had SVT ablation c/b CHB and underwent PPM placement  ? ?Had cath in 1996. Normal coronary arteries.   ? ?Myoview 2011 EF 28% large inferior scar ?Echo 2013 EF 35-40%  ?Echo 2015 15-20%  ?Myoview 2017. EF 20% large scar ?Echo 2019 EF 15-20% ?Echo 1/23 EF 15-20%  ? ?St. Jude BiV ICD implanted 1996, upgrade 2011, gen change 2020 for CHF (LV port capped). He has previously been offered CRT upgrade and has declined ? ?Here with his wife. Says over the past 3 months has been feeling better. Able to go store and take his time and walk around. Before that says he was getting SOB with any activity. Attributes the change to losing 20 pounds over the past 6 months. Denies CP, edema, orthopnea or PND. + heavy snoring. Takes lasix 40mg  daily. Om xarelto for PAF  ? ? ?Review of Systems: [y] = yes, [ ]  = no  ? ?General: Weight gain [ ] ; Weight loss [ ] ; Anorexia [ ] ; Fatigue [ ] ; Fever [ ] ; Chills [ ] ; Weakness [ ]   ?Cardiac: Chest pain/pressure [ ] ; Resting SOB [ ] ; Exertional SOB [ y]; Orthopnea [ ] ; Pedal Edema [ ] ; Palpitations [ ] ; Syncope [ ] ; Presyncope [ ] ; Paroxysmal nocturnal dyspnea[ ]   ?Pulmonary: Cough [ ] ; Wheezing[ ] ; Hemoptysis[ ] ; Sputum [ ] ; Snoring Blue.Reese ]  ?GI: Vomiting[ ] ; Dysphagia[ ] ; Melena[ ] ; Hematochezia [ ] ; Heartburn[ ] ; Abdominal pain [ ] ; Constipation [ ] ; Diarrhea [ ] ; BRBPR [ ]   ?GU: Hematuria[ ] ; Dysuria [ ] ;  Nocturia[ ]   ?Vascular: Pain in legs with walking [ ] ; Pain in feet with lying flat [ ] ; Non-healing sores [ ] ; Stroke [ ] ; TIA [ ] ; Slurred speech [ ] ;  ?Neuro: Headaches[ ] ; Vertigo[ ] ; Seizures[ ] ; Paresthesias[ ] ;Blurred vision [ ] ; Diplopia [ ] ; Vision changes [ ]   ?Ortho/Skin: Arthritis [ y]; Joint pain [ y]; Muscle pain [ ] ; Joint swelling [ ] ; Back Pain [ ] ; Rash [ ]   ?Psych: Depression[ ] ; Anxiety[ ]   ?Heme: Bleeding problems [ ] ; Clotting disorders [ ] ; Anemia [ ]   ?Endocrine: Diabetes Blue.Reese ]; Thyroid dysfunction[ ]  ? ? ?Past Medical History:  ?Diagnosis Date  ? Asthma   ? Automatic implantable cardioverter-defibrillator in situ   ? Back pain   ? Chronic systolic CHF (congestive heart failure), NYHA class 3 ([place])   ? Complete heart block [facility])   ? s/p PPM 1997 with upgrade to AICD 2011  ? Depression   ? Dilated aortic root ([place])   ? 77mm ascending aortic root  ? DM (diabetes mellitus) ([place])   ? Glaucoma   ? HTN (hypertension)   ? Hyperlipidemia   ? Nonischemic cardiomyopathy ([place])   ? EF of 15-20% by echo 2015 and 20% by nuclear stress test 2017  ? Obesity   ? PAF (paroxysmal atrial fibrillation) ([place])   ? [place]  minutes of PAF documented on ICD check 2018 - anticoagulated with Eliquis  ? Sarcoid   ? Sleep apnea 06-28-12  ? no cpap used-unable to tolerate  ? SVT (supraventricular tachycardia) (HCC)   ? s/p ablation  ? ? ?Current Outpatient Medications  ?Medication Sig Dispense Refill  ? albuterol (PROVENTIL HFA;VENTOLIN HFA) 108 (90 BASE) MCG/ACT inhaler Inhale 2 puffs into the lungs every 4 (four) hours as needed for wheezing. For shortness of breath    ? furosemide (LASIX) 40 MG tablet Take 1 tablet (40 mg total) by mouth daily. 90 tablet 3  ? JARDIANCE 25 MG TABS tablet Take 25 mg by mouth daily.    ? metoprolol succinate (TOPROL-XL) 50 MG 24 hr tablet Take 1 tablet (50 mg total) by mouth daily. Take with or immediately following a meal. 90 tablet 3  ? OZEMPIC, 0.25 OR 0.5 MG/DOSE, 2 MG/1.5ML SOPN Inject 1 mg  into the skin once a week.    ? rivaroxaban (XARELTO) 20 MG TABS tablet Take 1 tablet (20 mg total) by mouth daily with supper. 30 tablet 11  ? sacubitril-valsartan (ENTRESTO) 97-103 MG Take 1 tablet by mouth 2 (two) times daily. 180 tablet 3  ? simvastatin (ZOCOR) 40 MG tablet Take 40 mg by mouth every evening.    ? spironolactone (ALDACTONE) 25 MG tablet Take 1 tablet (25 mg total) by mouth daily. 90 tablet 3  ? SYMBICORT 80-4.5 MCG/ACT inhaler Inhale 2 puffs into the lungs 2 (two) times a day.    ? TRESIBA FLEXTOUCH 200 UNIT/ML SOPN Inject 80 Units into the skin every morning.    ? ?No current facility-administered medications for this encounter.  ? ?Facility-Administered Medications Ordered in Other Encounters  ?Medication Dose Route Frequency Provider Last Rate Last Admin  ? technetium pyrophosphate Tc 67m injection 123456 millicurie  123456 millicurie Intravenous Once Dorothy Spark, MD      ? ? ?Allergies  ?Allergen Reactions  ? Penicillins Other (See Comments)  ?  Dizziness ?Did it involve swelling of the face/tongue/throat, SOB, or low BP? Yes ?Did it involve sudden or severe rash/hives, skin peeling, or any reaction on the inside of your mouth or nose? No ?Did you need to seek medical attention at a hospital or doctor's office? Yes ?When did it last happen?      childhood ?If all above answers are ?NO?, may proceed with cephalosporin use. ?  ? Penicillin G Benzathine Other (See Comments)  ? ? ?  ?Social History  ? ?Socioeconomic History  ? Marital status: Legally Separated  ?  Spouse name: Not on file  ? Number of children: 2  ? Years of education: GED  ? Highest education level: Not on file  ?Occupational History  ? Not on file  ?Tobacco Use  ? Smoking status: Never  ? Smokeless tobacco: Never  ?Vaping Use  ? Vaping Use: Never used  ?Substance and Sexual Activity  ? Alcohol use: Not Currently  ?  Comment: occasionally  ? Drug use: No  ? Sexual activity: Yes  ?Other Topics Concern  ? Not on file  ?Social  History Narrative  ? Not on file  ? ?Social Determinants of Health  ? ?Financial Resource Strain: Low Risk   ? Difficulty of Paying Living Expenses: Not very hard  ?Food Insecurity: No Food Insecurity  ? Worried About Charity fundraiser in the Last Year: Never true  ? Ran Out of Food in the Last Year: Never true  ?Transportation Needs:  No Transportation Needs  ? Lack of Transportation (Medical): No  ? Lack of Transportation (Non-Medical): No  ?Physical Activity: Not on file  ?Stress: Not on file  ?Social Connections: Not on file  ?Intimate Partner Violence: Not on file  ? ? ?  ?Family History  ?Problem Relation Age of Onset  ? Diabetes Mother   ? Alzheimer's disease Mother   ? CVA Father   ? Heart failure Father   ? Epilepsy Sister   ? Diabetes Brother   ? Diabetes Sister   ? ? ?Vitals:  ? 03/25/21 1347  ?BP: 108/70  ?Pulse: 98  ?SpO2: 96%  ?Weight: (!) 157.9 kg (348 lb 2 oz)  ? ?Filed Weights  ? 03/25/21 1347  ?Weight: (!) 157.9 kg (348 lb 2 oz)  ?  ?PHYSICAL EXAM: ?General:  Well appearing. No respiratory difficulty ?HEENT: normal ?Neck: supple. no JVD. Carotids 2+ bilat; no bruits. No lymphadenopathy or thryomegaly appreciated. ?Cor: PMI nondisplaced. Regular rate & rhythm. No rubs, gallops or murmurs. ?Lungs: clear ?Abdomen: soft, nontender, nondistended. No hepatosplenomegaly. No bruits or masses. Good bowel sounds. ?Extremities: no cyanosis, clubbing, rash, edema ?Neuro: alert & oriented x 3, cranial nerves grossly intact. moves all 4 extremities w/o difficulty. Affect pleasant. ? ?ECG: NSR 76 with v-pacing and very wide QRS > 174ms Personally reviewed ? ? ?ASSESSMENT & PLAN: ? ?1.  Chronic systolic CHF  ?- longstanding LV dysfunction ?- Etiology remains unclear ?- last cath 1996 with normal coronaries however subsequent Myoview have suggested possible underlying iHD - which I would suspect given his CRFs. Also has chronic RV pacing ?- Improved NYHA II-III Volume status looks good  ?- s/p SJ BiVICD (LV lead  capped) - has refused upgrade to CRT ?- Continue Entresto 97-103mg  BID ?- Continue Toprol XL 50mg  daily ?- Continue Spiro 25mg  daily ?- Continue Jardiance 25mg  daily ?- Continue Lasix 40mg  daily  ?- Wi

## 2021-04-01 ENCOUNTER — Encounter (HOSPITAL_COMMUNITY): Payer: Medicare Other | Admitting: Internal Medicine

## 2021-04-01 ENCOUNTER — Telehealth (HOSPITAL_COMMUNITY): Payer: Self-pay | Admitting: *Deleted

## 2021-04-14 ENCOUNTER — Ambulatory Visit (HOSPITAL_COMMUNITY)
Admission: RE | Disposition: A | Discharge status: Home / Self Care | Payer: Self-pay | Source: Ambulatory Visit | Attending: Internal Medicine

## 2021-04-14 ENCOUNTER — Ambulatory Visit (HOSPITAL_COMMUNITY)
Admission: RE | Admit: 2021-04-14 | Discharge: 2021-04-14 | Disposition: A | Discharge status: Home / Self Care | Payer: Medicare Other | Source: Ambulatory Visit | Attending: Internal Medicine | Admitting: Internal Medicine

## 2021-04-14 ENCOUNTER — Other Ambulatory Visit: Payer: Self-pay

## 2021-04-14 DIAGNOSIS — I5022 Chronic systolic (congestive) heart failure: Secondary | ICD-10-CM | POA: Diagnosis not present

## 2021-04-14 DIAGNOSIS — E119 Type 2 diabetes mellitus without complications: Secondary | ICD-10-CM | POA: Diagnosis not present

## 2021-04-14 DIAGNOSIS — Z794 Long term (current) use of insulin: Secondary | ICD-10-CM | POA: Diagnosis not present

## 2021-04-14 DIAGNOSIS — R0683 Snoring: Secondary | ICD-10-CM | POA: Diagnosis not present

## 2021-04-14 DIAGNOSIS — Z95810 Presence of automatic (implantable) cardiac defibrillator: Secondary | ICD-10-CM | POA: Insufficient documentation

## 2021-04-14 DIAGNOSIS — Z7951 Long term (current) use of inhaled steroids: Secondary | ICD-10-CM | POA: Diagnosis not present

## 2021-04-14 DIAGNOSIS — Z7985 Long-term (current) use of injectable non-insulin antidiabetic drugs: Secondary | ICD-10-CM | POA: Insufficient documentation

## 2021-04-14 DIAGNOSIS — I110 Hypertensive heart disease with heart failure: Secondary | ICD-10-CM | POA: Diagnosis not present

## 2021-04-14 DIAGNOSIS — I480 Paroxysmal atrial fibrillation: Secondary | ICD-10-CM | POA: Insufficient documentation

## 2021-04-14 DIAGNOSIS — Z7901 Long term (current) use of anticoagulants: Secondary | ICD-10-CM | POA: Diagnosis not present

## 2021-04-14 DIAGNOSIS — I428 Other cardiomyopathies: Secondary | ICD-10-CM | POA: Insufficient documentation

## 2021-04-14 DIAGNOSIS — I2510 Atherosclerotic heart disease of native coronary artery without angina pectoris: Secondary | ICD-10-CM

## 2021-04-14 DIAGNOSIS — Z79899 Other long term (current) drug therapy: Secondary | ICD-10-CM | POA: Insufficient documentation

## 2021-04-14 DIAGNOSIS — I471 Supraventricular tachycardia: Secondary | ICD-10-CM | POA: Insufficient documentation

## 2021-04-14 DIAGNOSIS — G4733 Obstructive sleep apnea (adult) (pediatric): Secondary | ICD-10-CM | POA: Diagnosis not present

## 2021-04-14 DIAGNOSIS — Z7984 Long term (current) use of oral hypoglycemic drugs: Secondary | ICD-10-CM | POA: Diagnosis not present

## 2021-04-14 DIAGNOSIS — D860 Sarcoidosis of lung: Secondary | ICD-10-CM | POA: Insufficient documentation

## 2021-04-14 HISTORY — PX: RIGHT/LEFT HEART CATH AND CORONARY ANGIOGRAPHY: CATH118266

## 2021-04-14 LAB — POCT I-STAT EG7
Acid-base deficit: 1 mmol/L (ref 0.0–2.0)
Acid-base deficit: 4 mmol/L — ABNORMAL HIGH (ref 0.0–2.0)
Bicarbonate: 21.5 mmol/L (ref 20.0–28.0)
Bicarbonate: 25.3 mmol/L (ref 20.0–28.0)
Calcium, Ion: 1.02 mmol/L — ABNORMAL LOW (ref 1.15–1.40)
Calcium, Ion: 1.37 mmol/L (ref 1.15–1.40)
HCT: 32 % — ABNORMAL LOW (ref 39.0–52.0)
HCT: 37 % — ABNORMAL LOW (ref 39.0–52.0)
Hemoglobin: 10.9 g/dL — ABNORMAL LOW (ref 13.0–17.0)
Hemoglobin: 12.6 g/dL — ABNORMAL LOW (ref 13.0–17.0)
O2 Saturation: 71 %
O2 Saturation: 74 %
Potassium: 2.8 mmol/L — ABNORMAL LOW (ref 3.5–5.1)
Potassium: 3.6 mmol/L (ref 3.5–5.1)
Sodium: 141 mmol/L (ref 135–145)
Sodium: 145 mmol/L (ref 135–145)
TCO2: 23 mmol/L (ref 22–32)
TCO2: 27 mmol/L (ref 22–32)
pCO2, Ven: 41.9 mmHg — ABNORMAL LOW (ref 44–60)
pCO2, Ven: 47.7 mmHg (ref 44–60)
pH, Ven: 7.318 (ref 7.25–7.43)
pH, Ven: 7.333 (ref 7.25–7.43)
pO2, Ven: 40 mmHg (ref 32–45)
pO2, Ven: 43 mmHg (ref 32–45)

## 2021-04-14 LAB — BASIC METABOLIC PANEL
Anion gap: 5 (ref 5–15)
BUN: <5 mg/dL — ABNORMAL LOW (ref 8–23)
CO2: 24 mmol/L (ref 22–32)
Calcium: 9.6 mg/dL (ref 8.9–10.3)
Chloride: 110 mmol/L (ref 98–111)
Creatinine, Ser: 0.75 mg/dL (ref 0.61–1.24)
GFR, Estimated: >60 mL/min (ref >60)
Glucose, Bld: 78 mg/dL (ref 70–99)
Potassium: 3.5 mmol/L (ref 3.5–5.1)
Sodium: 139 mmol/L (ref 135–145)

## 2021-04-14 LAB — CBC
HCT: 38 % — ABNORMAL LOW (ref 39.0–52.0)
Hemoglobin: 12.8 g/dL — ABNORMAL LOW (ref 13.0–17.0)
MCH: 29.3 pg (ref 26.0–34.0)
MCHC: 33.7 g/dL (ref 30.0–36.0)
MCV: 87 fL (ref 80.0–100.0)
Platelets: 226 10*3/uL (ref 150–400)
RBC: 4.37 MIL/uL (ref 4.22–5.81)
RDW: 12.9 % (ref 11.5–15.5)
WBC: 6.7 10*3/uL (ref 4.0–10.5)
nRBC: 0 % (ref 0.0–0.2)

## 2021-04-14 LAB — POCT I-STAT 7, (LYTES, BLD GAS, ICA,H+H)
Acid-base deficit: 1 mmol/L (ref 0.0–2.0)
Bicarbonate: 24.3 mmol/L (ref 20.0–28.0)
Calcium, Ion: 1.27 mmol/L (ref 1.15–1.40)
HCT: 36 % — ABNORMAL LOW (ref 39.0–52.0)
Hemoglobin: 12.2 g/dL — ABNORMAL LOW (ref 13.0–17.0)
O2 Saturation: 98 %
Potassium: 3.4 mmol/L — ABNORMAL LOW (ref 3.5–5.1)
Sodium: 142 mmol/L (ref 135–145)
TCO2: 26 mmol/L (ref 22–32)
pCO2 arterial: 42.4 mmHg (ref 32–48)
pH, Arterial: 7.366 (ref 7.35–7.45)
pO2, Arterial: 106 mmHg (ref 83–108)

## 2021-04-14 LAB — GLUCOSE, CAPILLARY
Glucose-Capillary: 76 mg/dL (ref 70–99)
Glucose-Capillary: 75 mg/dL (ref 70–99)
Glucose-Capillary: 55 mg/dL — ABNORMAL LOW (ref 70–99)

## 2021-04-14 SURGERY — RIGHT/LEFT HEART CATH AND CORONARY ANGIOGRAPHY
Anesthesia: LOCAL

## 2021-04-14 MED ORDER — VERAPAMIL HCL 2.5 MG/ML IV SOLN
INTRAVENOUS | Status: AC
  Filled 2021-04-14: qty 2

## 2021-04-14 MED ORDER — HEPARIN (PORCINE) IN NACL 1000-0.9 UT/500ML-% IV SOLN
INTRAVENOUS | Status: DC | PRN
  Administered 2021-04-14 (×2): 500 mL

## 2021-04-14 MED ORDER — LIDOCAINE HCL (PF) 1 % IJ SOLN
INTRAMUSCULAR | Status: AC
  Filled 2021-04-14: qty 30

## 2021-04-14 MED ORDER — SODIUM CHLORIDE 0.9% FLUSH: 3.0000 mL | Freq: Two times a day (BID) | INTRAVENOUS | Status: DC

## 2021-04-14 MED ORDER — MIDAZOLAM HCL 2 MG/2ML IJ SOLN
INTRAMUSCULAR | Status: DC | PRN
Start: 2021-04-14 — End: 2021-04-14
  Administered 2021-04-14: 1 mg via INTRAVENOUS

## 2021-04-14 MED ORDER — ASPIRIN 81 MG PO CHEW
81.0000 mg | CHEWABLE_TABLET | ORAL | Status: AC
  Administered 2021-04-14: 81 mg via ORAL
  Filled 2021-04-14: qty 1

## 2021-04-14 MED ORDER — SODIUM CHLORIDE 0.9% FLUSH: 3.0000 mL | INTRAVENOUS | Status: DC | PRN

## 2021-04-14 MED ORDER — LABETALOL HCL 5 MG/ML IV SOLN: 10.0000 mg | INTRAVENOUS | Status: DC | PRN

## 2021-04-14 MED ORDER — ONDANSETRON HCL 4 MG/2ML IJ SOLN: 4.0000 mg | Freq: Four times a day (QID) | INTRAMUSCULAR | Status: DC | PRN

## 2021-04-14 MED ORDER — FENTANYL CITRATE (PF) 100 MCG/2ML IJ SOLN
INTRAMUSCULAR | Status: AC
Start: 2021-04-14 — End: ?
  Filled 2021-04-14: qty 2

## 2021-04-14 MED ORDER — HYDRALAZINE HCL 20 MG/ML IJ SOLN: 10.0000 mg | INTRAMUSCULAR | Status: DC | PRN

## 2021-04-14 MED ORDER — SODIUM CHLORIDE 0.9 % IV SOLN: 250.0000 mL | INTRAVENOUS | Status: DC | PRN

## 2021-04-14 MED ORDER — HEPARIN (PORCINE) IN NACL 2000-0.9 UNIT/L-% IV SOLN
INTRAVENOUS | Status: AC
  Filled 2021-04-14: qty 1000

## 2021-04-14 MED ORDER — ACETAMINOPHEN 325 MG PO TABS: 650.0000 mg | ORAL_TABLET | ORAL | Status: DC | PRN

## 2021-04-14 MED ORDER — IOHEXOL 350 MG/ML SOLN
INTRAVENOUS | Status: DC | PRN
  Administered 2021-04-14: 65 mL

## 2021-04-14 MED ORDER — LIDOCAINE HCL (PF) 1 % IJ SOLN
INTRAMUSCULAR | Status: DC | PRN
  Administered 2021-04-14 (×2): 2 mL via INTRADERMAL

## 2021-04-14 MED ORDER — SODIUM CHLORIDE 0.9 % IV SOLN: INTRAVENOUS | Status: DC

## 2021-04-14 MED ORDER — HEPARIN (PORCINE) IN NACL 2-0.9 UNITS/ML
INTRAMUSCULAR | Status: DC | PRN
  Administered 2021-04-14: 10 mL via INTRA_ARTERIAL

## 2021-04-14 MED ORDER — HEPARIN SODIUM (PORCINE) 1000 UNIT/ML IJ SOLN
INTRAMUSCULAR | Status: AC
  Filled 2021-04-14: qty 10

## 2021-04-14 MED ORDER — FENTANYL CITRATE (PF) 100 MCG/2ML IJ SOLN: INTRAMUSCULAR | Status: DC | PRN

## 2021-04-14 MED ORDER — HEPARIN SODIUM (PORCINE) 1000 UNIT/ML IJ SOLN
INTRAMUSCULAR | Status: DC | PRN
  Administered 2021-04-14: 7000 [IU] via INTRAVENOUS

## 2021-04-14 MED ORDER — MIDAZOLAM HCL 2 MG/2ML IJ SOLN
INTRAMUSCULAR | Status: AC
  Filled 2021-04-14: qty 2

## 2021-04-14 SURGICAL SUPPLY — 11 items
BAND CMPR LRG ZPHR (HEMOSTASIS) ×1
BAND ZEPHYR COMPRESS 30 LONG (HEMOSTASIS) ×1 IMPLANT
CATH 5FR JR4 DIAGNOSTIC (CATHETERS) ×1 IMPLANT
CATH BALLN WEDGE 5F 110CM (CATHETERS) ×1 IMPLANT
CATH JL3.5 FR DIAG (CATHETERS) ×1 IMPLANT
GLIDESHEATH SLEND SS 6F .021 (SHEATH) ×1 IMPLANT
GUIDEWIRE INQWIRE 1.5J.035X260 (WIRE) IMPLANT
INQWIRE 1.5J .035X260CM (WIRE) ×2
PACK CARDIAC CATHETERIZATION (CUSTOM PROCEDURE TRAY) ×2 IMPLANT
SHEATH GLIDE SLENDER 4/5FR (SHEATH) ×1 IMPLANT
TRANSDUCER W/STOPCOCK (MISCELLANEOUS) ×2 IMPLANT

## 2021-04-14 NOTE — Progress Notes (Signed)
Received call from lab/ cbc tube hemolyzed. Will redraw by lab tech. ?

## 2021-04-14 NOTE — Interval H&P Note (Signed)
History and Physical Interval Note: ? ?04/14/2021 ?12:49 PM ? ?[name]  has presented today for surgery, with the diagnosis of heart failure.  The various methods of treatment have been discussed with the patient and family. After consideration of risks, benefits and other options for treatment, the patient has consented to  Procedure(s): ?RIGHT/LEFT HEART CATH AND CORONARY ANGIOGRAPHY (N/A) and a percutaneous coronary angioplasty as a surgical intervention.  The patient's history has been reviewed, patient examined, no change in status, stable for surgery.  I have reviewed the patient's chart and labs.  Questions were answered to the patient's satisfaction.   ? ? ?[name] ? ? ?

## 2021-04-15 ENCOUNTER — Encounter (HOSPITAL_COMMUNITY): Payer: Self-pay | Admitting: Internal Medicine

## 2021-04-16 MED FILL — Fentanyl Citrate Preservative Free (PF) Inj 100 MCG/2ML: INTRAMUSCULAR | Qty: 2 | Status: AC

## 2021-05-03 DIAGNOSIS — H40013 Open angle with borderline findings, low risk, bilateral: Secondary | ICD-10-CM | POA: Diagnosis not present

## 2021-05-03 DIAGNOSIS — H35033 Hypertensive retinopathy, bilateral: Secondary | ICD-10-CM | POA: Diagnosis not present

## 2021-05-03 DIAGNOSIS — H16223 Keratoconjunctivitis sicca, not specified as Sjogren's, bilateral: Secondary | ICD-10-CM | POA: Diagnosis not present

## 2021-05-03 DIAGNOSIS — E119 Type 2 diabetes mellitus without complications: Secondary | ICD-10-CM | POA: Diagnosis not present

## 2021-05-03 DIAGNOSIS — H11153 Pinguecula, bilateral: Secondary | ICD-10-CM | POA: Diagnosis not present

## 2021-05-03 DIAGNOSIS — H26492 Other secondary cataract, left eye: Secondary | ICD-10-CM | POA: Diagnosis not present

## 2021-05-12 DIAGNOSIS — J45909 Unspecified asthma, uncomplicated: Secondary | ICD-10-CM | POA: Diagnosis not present

## 2021-05-12 DIAGNOSIS — I77810 Thoracic aortic ectasia: Secondary | ICD-10-CM | POA: Diagnosis not present

## 2021-05-12 DIAGNOSIS — G4733 Obstructive sleep apnea (adult) (pediatric): Secondary | ICD-10-CM | POA: Diagnosis not present

## 2021-05-12 DIAGNOSIS — I700 Atherosclerosis of aorta: Secondary | ICD-10-CM | POA: Diagnosis not present

## 2021-05-12 DIAGNOSIS — E1142 Type 2 diabetes mellitus with diabetic polyneuropathy: Secondary | ICD-10-CM | POA: Diagnosis not present

## 2021-05-12 DIAGNOSIS — M549 Dorsalgia, unspecified: Secondary | ICD-10-CM | POA: Diagnosis not present

## 2021-05-12 DIAGNOSIS — Z95810 Presence of automatic (implantable) cardiac defibrillator: Secondary | ICD-10-CM | POA: Diagnosis not present

## 2021-05-12 DIAGNOSIS — I5022 Chronic systolic (congestive) heart failure: Secondary | ICD-10-CM | POA: Diagnosis not present

## 2021-05-12 DIAGNOSIS — I4891 Unspecified atrial fibrillation: Secondary | ICD-10-CM | POA: Diagnosis not present

## 2021-05-12 DIAGNOSIS — I10 Essential (primary) hypertension: Secondary | ICD-10-CM | POA: Diagnosis not present

## 2021-05-12 DIAGNOSIS — I471 Supraventricular tachycardia: Secondary | ICD-10-CM | POA: Diagnosis not present

## 2021-05-12 DIAGNOSIS — D860 Sarcoidosis of lung: Secondary | ICD-10-CM | POA: Diagnosis not present

## 2021-05-12 DIAGNOSIS — E782 Mixed hyperlipidemia: Secondary | ICD-10-CM | POA: Diagnosis not present

## 2021-05-14 ENCOUNTER — Other Ambulatory Visit: Payer: Self-pay | Admitting: Occupational Therapy

## 2021-05-15 NOTE — Patient Outreach (Signed)
Aging Gracefully Program ? ?OT FINAL Visit ? ?05/15/2021 ? ?[name] ?[birth year] ?[id number] ? ?Visit:  4- Fourth Visit ? ?Start Time:  1400 ?End Time:  3086 ?Total Minutes:  20 ? ?Readiness to Change:  Readiness to Change Score: 10 ? ?Patient Education: ?Education Provided: Yes ?Education Details: Safety at home booklet ?Person(s) Educated: Patient ?Comprehension: Verbalized Understanding ? ?Goals: ? Goals Addressed   ? ?  ?  ?  ?  ? This Visit's Progress  ?  COMPLETED: Patient stated     ?  He would like to feel more safe getting in and out of his house and up/down the steps. A railing around porch is recommended and pt to consider ramp at front door (dependent on cost and length of ramp needed to meet code) ? ?MET--he now has railings around his entire front porch and a new hand rail on the left hand side as you go down the steps. ?  ?  COMPLETED: Patient Stated     ?  He would like to feel safer getting down to and up from his laundry room (grab bar on wall to the right as you go up the steps is recommended)--COMPLETED ? ?[name] is able to successfully use the grab bar coming up out of his laundry room ?  ?  COMPLETED: Patient Stated     ?  He would like to feel more safe getting in and out of his tub. (Options are full tub to shower conversion OR a tub cut out, keeping his current grab bars and handheld shower) and also providing him a shower seat. ? ?Goal is partially met: Shower seat provided. Goal MET today (05/15/2021)--tub has been converted to walk in shower. ? ?ACTION PLANNING - BATHING  ?Target Problem Area: ?Decreased safety with showering  ?Why Problem May Occur: ?No shower seat, decreased balance  ?Target Goal: ?Safety and independence with showering  ? ?STRATEGIES ?Saving Your Energy: ?DO: DON'T:  ?Use a shower seat Minimize standing while bathing, it uses more energy  ?Keep all items you'll need within easy reach [place]  ?Modifying your home environment and making it safe: ?DO: DON'T:   ?Use grab bars in the shower    ?Place a rubber mat the size you need in the shower Place loose rugs in the bathroom- they can trip you or your walker/cane can get caught on them  ?Make sure the bathroom is well lit    ?Use handheld shower head   ?Simplifying the way you set up tasks or daily routines: ?DO: DON'T:  ?Plan to shower before you're overly tired [place] through bathing/showering   ?Gather all items before getting started   ? ?Practice ?It is important to practice the strategies so we can determine if they will be effective in helping to reach your goal. ?Follow these specific recommendations: ?1.Use your new shower seat. ?2.Use a non-skid mat or non-slip socks (you showed me) when in the shower. ? ?If a strategy does not work the first time, try it again and again (and maybe again). ?Golden Circle, OTR/L       02/27/2020 ? ?  ? ?  ? ? ?Post Clinical Reasoning: ?Client Action (Goal) One Interventions: Client reports the grab bar in laundry room is helpful to him ?Did Client Try?: No ?Reason Client Did Not Try?:  (he has already been using it and reports not issues) ?Targeted Problem Area Status: A Lot Better ?Client Action (Goal) Two Interventions: Tub to walk in  shower conversion has been completed and client reports success with the shower seat, grab bars, and hand held shower head that were installed. He also reports success with grab bars at toilet. ?Did Client Try?: No ?Did Client Try?: No ?Targeted Problem Area Status: A Lot Better ?Client Action (Goal) Four Interventions: Client reports that he feels more safe on porch now and up and down steps since he has a rail on both side of steps now ?Did Client Try?: No ?Reason Client Did Not Try?:  (He has been using the modificaitions for several weeks now and does not report any issues) ?Targeted Problem Area Status: A Lot Better ?Clinician View Of Client Situation:: [name]  is doing great! He is feeling better and is grateful for the work that  [employer] has done for him. ?Client View Of His/Her Situation:: He is happy with the home modifications and repairs at his house. He also reports that he is feeling better since has been exercising (walking back and forth on his driveway) and his stamina is better. He is hoping to go and visit family in [place] and [place]. ?Next Visit Plan:: This is my last with [name] ? ?Golden Circle, OTR/L ?Aging Gracefully ?[phone] ? ? ?

## 2021-05-15 NOTE — Patient Instructions (Addendum)
He would like to feel safer getting down to and up from his laundry room (grab bar on wall to the right as you go up the steps is recommended)--COMPLETED ? ?Mr. Dietrick is able to successfully use the grab bar coming up out of his laundry room ? ? ? ? ?He would like to feel more safe getting in and out of his tub. (Options are full tub to shower conversion OR a tub cut out, keeping his current grab bars and handheld shower) and also providing him a shower seat. ? ?Goal is partially met: Shower seat provided. Goal MET today (05/15/2021)--tub has been converted to walk in shower. ? ?ACTION PLANNING - BATHING  ?Target Problem Area: ?Decreased safety with showering  ?Why Problem May Occur: ?No shower seat, decreased balance  ?Target Goal: ?Safety and independence with showering  ? ?STRATEGIES ?Saving Your Energy: ?DO: DON'T:  ?Use a shower seat Minimize standing while bathing, it uses more energy  ?Keep all items you'll need within easy reach [place]  ? ?Modifying your home environment and making it safe: ?DO: DON'T:  ?Use grab bars in the shower    ?Place a rubber mat the size you need in the shower Place loose rugs in the bathroom- they can trip you or your walker/cane can get caught on them  ?Make sure the bathroom is well lit    ?Use handheld shower head   ? ?Simplifying the way you set up tasks or daily routines: ?DO: DON'T:  ?Plan to shower before you're overly tired [place] through bathing/showering   ?Gather all items before getting started   ? ?Practice ?It is important to practice the strategies so we can determine if they will be effective in helping to reach your goal. ?Follow these specific recommendations: ?1.Use your new shower seat. ?2.Use a non-skid mat or non-slip socks (you showed me) when in the shower. ? ?If a strategy does not work the first time, try it again and again (and maybe again). ?Golden Circle, OTR/L       02/27/2020 ? ? ? ? ?He would like to feel more safe getting in and out of his house and  up/down the steps. A railing around porch is recommended and pt to consider ramp at front door (dependent on cost and length of ramp needed to meet code) ? ?MET--he now has railings around his entire front porch and a new hand rail on the left hand side as you go down the steps. ?

## 2021-05-17 ENCOUNTER — Telehealth: Payer: Self-pay

## 2021-05-17 NOTE — Patient Outreach (Signed)
Aging Gracefully Program ? ?05/17/2021 ? ?Kamaal L Nafziger ?1955/05/31 ?[id number] ? ? ?Reminder call completed to confirm Aging Gracefully Home Visit with Mr. Hoefling: scheduled for 05/18/21 at 9:30 am.  ? ?Kathyrn Sheriff, RN, MSN, BSN, CCM ?Care Management Coordinator ?Aging Gracefully ?[phone]  ?

## 2021-05-18 ENCOUNTER — Other Ambulatory Visit: Payer: Self-pay

## 2021-05-18 NOTE — Patient Instructions (Addendum)
Goals Addressed   ? ?  ?  ?  ?  ?  ? This Visit's Progress  ?   Patient Stated (pt-stated)     ?   Aging Gracefully RN goals: ? ?Goal:  Patient will report decrease in back pain in the next 120 days. ? ?05/18/21 ?Assessment: Patient reports improvement in back pain- "3/10" today.  ? ?Interventions: discussed the importance of exercise in improving strength and muscle tone. Discussed exercise booklet previously provided. Encouraged to continue to walk in driveway and gradually increase this activity. Encouraged to also participate in activities at the [facility] reports is close to him). ? ?Plan: next home visit planned for 06/16/21 ? ?Thea Silversmith, RN, MSN, BSN, CCM ?Aging Gracefully ?Care Management Coordinator ?[phone]  ?  ?   Patient Stated (pt-stated)     ?   Aging Gracefully RN Goals: ? ?Goal:  Patient will report decrease number of low CBG readings in the next 120 days. ? ?05/18/21 ?Assessment: CBG 73 this morning. Patient reports, felt shaky so ate applesauce. Denies any issues with blood sugar now. Reports recent provider visit and A1C 6.9. Trulicity added 7.5mg  q week. He reports had an eye exam about 3 weeks ago and reports he is checking feet daily.  ? ?Interventions: Provided and reviewed booklet: "How to thrive a guide for your Journey with Diabetes. Reviewed the 15:15 rule. Discussed the importance of eating balanced diet. ? ?Plan: Next home visit planned for 06/16/21 ? ?Thea Silversmith, RN, MSN, BSN, CCM ?Aging Gracefully ?Care Management Coordinator ?[phone]  ?  ? ?  ?  ?

## 2021-05-18 NOTE — Patient Outreach (Signed)
Aging Gracefully Program ? ?RN Visit ? ?05/18/2021 ? ?[name] ?October 24, 1955 ?[id number] ? ?Visit:  Aging Gracefully RN Visit #3  ? ?Start Time:  09:40 am  ?End Time:   10:10 am ?Total Minutes:   30 min ? ?Readiness To Change Score:   Completed by OT on 05/15/21 Score: 10 ? ?Universal RN Interventions: ?Calendar Distribution: Yes (reports has been provided a Calendar) ?Exercise Review: Yes (reports has been provided excersice booklet and denies any questions. Discussed importance of exercise and maintaining activity.) ?Medication Changes: Yes (patient reports recent Provider visit-Trulicity .75mg  weekly added) ?Mood: Yes (patient reports Mood is "fine") ?Pain: Yes (reports, "I'm doing a little better". States pain "3/10". Last Aleve taken on yesterday.) ?Fall Prevention: Yes (denies any falls) ?Clinician View Of Client Situation: Very Pleasant gentleman in good spirits. Ambulated to door without assitive device without difficulty. Home environment clean. ?Client View Of His/Her Situation: [name] reports he is doing well. reports has a home to live in; a car to get around; able to get his medications. He states he is doing good. A1C recently checked 6.9. states he is checking blood pressure about twice a day last checked 122/77; he reports he is checking blood sugar twice a day. blood sugar 73 this morning. he states he can tell when his blood sugar is low-treated with applesauce. Patient is without any blood sugar concerns at this time. ? ?Healthcare Provider Communication: ?Did Bethann Goo With [employer] Provider?: No ?According to Client, Did PCP Report Communication With An Aging Gracefully RN?: No ? ?Clinician View of Client Situation: ?Clinician View Of Client Situation: Very Pleasant gentleman in good spirits. Ambulated to door without assitive device without difficulty. Home environment clean. ?Client's View of His/Her Situation: ?Client View Of His/Her Situation: [name] reports he is  doing well. reports has a home to live in; a car to get around; able to get his medications. He states he is doing good. A1C recently checked 6.9. states he is checking blood pressure about twice a day last checked 122/77; he reports he is checking blood sugar twice a day. blood sugar 73 this morning. he states he can tell when his blood sugar is low-treated with applesauce. Patient is without any blood sugar concerns at this time. ? ?Medication Assessment: reports Trulicity 7.5mg  weekly added ?  ? ?OT Update: n/a ? ?Session Summary: Patient reports doing well, in good spirits. Reports blood sugar 73 which he felt shaky at the time and treated with applesauce. Discussed importance of eating healthy and encouraged to increase activity. Provided and reviewed booklet, "How to Thrive a Guide for you Journey with Diabetes".  ? ?Bethann Goo, RN, MSN, BSN, CCM ?Aging Gracefully ?Care Management Coordinator ?[phone]  ? ? ?

## 2021-05-28 ENCOUNTER — Ambulatory Visit (INDEPENDENT_AMBULATORY_CARE_PROVIDER_SITE_OTHER): Payer: Medicare Other

## 2021-05-28 DIAGNOSIS — I428 Other cardiomyopathies: Secondary | ICD-10-CM | POA: Diagnosis not present

## 2021-06-01 LAB — CUP PACEART REMOTE DEVICE CHECK
Battery Remaining Longevity: 55 mo
Battery Remaining Percentage: 59 %
Battery Voltage: 2.95 V
Brady Statistic AP VP Percent: 23 %
Brady Statistic AP VS Percent: 1 %
Brady Statistic AS VP Percent: 77 %
Brady Statistic AS VS Percent: 1 %
Brady Statistic RA Percent Paced: 22 %
Brady Statistic RV Percent Paced: 99 %
Date Time Interrogation Session: 20230519183708
HighPow Impedance: 46 Ohm
HighPow Impedance: 46 Ohm
Implantable Lead Implant Date: 19961213
Implantable Lead Implant Date: 20111202
Implantable Lead Location: 753859
Implantable Lead Location: 753860
Implantable Lead Model: 5524
Implantable Lead Model: 7121
Implantable Pulse Generator Implant Date: 20200518
Lead Channel Impedance Value: 340 Ohm
Lead Channel Impedance Value: 480 Ohm
Lead Channel Pacing Threshold Amplitude: 0.75 V
Lead Channel Pacing Threshold Amplitude: 0.75 V
Lead Channel Pacing Threshold Pulse Width: 0.5 ms
Lead Channel Pacing Threshold Pulse Width: 0.5 ms
Lead Channel Sensing Intrinsic Amplitude: 10.2 mV
Lead Channel Sensing Intrinsic Amplitude: 2.1 mV
Lead Channel Setting Pacing Amplitude: 2 V
Lead Channel Setting Pacing Amplitude: 2 V
Lead Channel Setting Pacing Pulse Width: 0.5 ms
Lead Channel Setting Sensing Sensitivity: 0.5 mV
Pulse Gen Serial Number: 9829382

## 2021-06-03 ENCOUNTER — Ambulatory Visit
Admission: RE | Admit: 2021-06-03 | Discharge: 2021-06-03 | Disposition: A | Discharge status: Home / Self Care | Payer: Medicare Other | Source: Ambulatory Visit | Attending: Internal Medicine | Admitting: Internal Medicine

## 2021-06-03 ENCOUNTER — Other Ambulatory Visit: Payer: Self-pay | Admitting: Internal Medicine

## 2021-06-03 DIAGNOSIS — M549 Dorsalgia, unspecified: Secondary | ICD-10-CM | POA: Diagnosis not present

## 2021-06-03 DIAGNOSIS — M47816 Spondylosis without myelopathy or radiculopathy, lumbar region: Secondary | ICD-10-CM | POA: Diagnosis not present

## 2021-06-03 DIAGNOSIS — M545 Low back pain, unspecified: Secondary | ICD-10-CM | POA: Diagnosis not present

## 2021-06-03 NOTE — Progress Notes (Signed)
Remote ICD transmission.   

## 2021-06-09 DIAGNOSIS — I5022 Chronic systolic (congestive) heart failure: Secondary | ICD-10-CM | POA: Diagnosis not present

## 2021-06-09 DIAGNOSIS — E1142 Type 2 diabetes mellitus with diabetic polyneuropathy: Secondary | ICD-10-CM | POA: Diagnosis not present

## 2021-06-09 DIAGNOSIS — E782 Mixed hyperlipidemia: Secondary | ICD-10-CM | POA: Diagnosis not present

## 2021-06-09 DIAGNOSIS — I10 Essential (primary) hypertension: Secondary | ICD-10-CM | POA: Diagnosis not present

## 2021-06-09 DIAGNOSIS — I4891 Unspecified atrial fibrillation: Secondary | ICD-10-CM | POA: Diagnosis not present

## 2021-06-09 DIAGNOSIS — J45909 Unspecified asthma, uncomplicated: Secondary | ICD-10-CM | POA: Diagnosis not present

## 2021-06-17 ENCOUNTER — Other Ambulatory Visit: Payer: Self-pay

## 2021-06-17 NOTE — Patient Outreach (Addendum)
Aging Gracefully Program  RN Visit  06/17/2021  [name] 02-27-1955 [id number]  Visit:   Final Visit #4  Start Time:   0915 End Time:   0950   Total Minutes:  50      Readiness To Change Score:  Readiness to Change Score: 10  Universal RN Interventions: Calendar Distribution: Yes (reports he is using and has found it helpful) Exercise Review: Yes (reports continues to do exerises from booklet) Medications: Yes Medication Changes: Yes (trulicity .75mg  weekly, tramadol 50mg  1 tablet qid prn, tizanidine 4mg  at hs prn) Mood: Yes Pain: Yes (reports lower back pain, has seen PCP and refered to orthopedic appointment scheduled for 06/22/21. rated "5" on pain scale. stretching, heat pad and medications help) PCP Advocacy/Support: Yes Fall Prevention: Yes (reports no falls) Incontinence: Yes (denies any problems with controlling bladder) Clinician View Of Client Situation: [name] is in good spirits. He is very engaged in his health. He has a good understanding of the importance of avoiding hypoglycemic episodes, although he admits he sometimes gets lazy and does not record blood sugar readings. He continue to work on blood sugar control. He follows up with providers as scheduled and contacts providers as needed. Client View Of His/Her Situation: Client reports health is very good, states, "a lot of things I used to do I cant do, but you just have to do what you can". BP 123/72 CBG 140 this morning(reports ate oranges last night). States in the past two weeks had a low blood sugar of 65 x2.  Reports his signs/symptoms tired, weak, but reports blood sugar does not go less than 70 often.  Healthcare Provider Communication: Did With 06/24/21 Provider?: No According to Client, Did PCP Report Communication With An Aging Gracefully RN?: No  Clinician View of Client Situation: Clinician View Of Client Situation: [name] is in good spirits. He is very engaged  in his health. He has a good understanding of the importance of avoiding hypoglycemic episodes, although he admits he sometimes gets lazy and does not record blood sugar readings. He continue to work on blood sugar control. He follows up with providers as scheduled and contacts providers as needed. Client's View of His/Her Situation: Client View Of His/Her Situation: Client reports health is very good, states, "a lot of things I used to do I cant do, but you just have to do what you can". BP 123/72 CBG 140 this morning(reports ate oranges last night). States in the past two weeks had a low blood sugar of 65 x2.  Reports his signs/symptoms tired, weak, but reports blood sugar does not go less than 70 often.  Medication Assessment: No problems with medication management noted   OT Update: n/a  Session Summary: Reports increase in lower back pain after driving to [place] [place]. Discussed pain relief interventions: heat, medication, stretching. He has been referred to orthopedics by his PCP. Reports pain "5" on pain scale today and is not impeding ADL's. Discussed diabetes and importance of preventing hypoglycemia. [name] reports a reduction of low blood sugar readings since the start of this program. Reviewed rule of "15" for treatment of low blood sugar. Encouraged to contact provider if he has consistent lows. Client verbalized understanding. Blood sugar today 140. RN provided handout What's on your plate? What's your number A1C, Dining out for Diabetics.Reinforced to pay attention to sodium level in foods, fats and carbohydrates on food labels.  Plan: Final Visit-Client instructed to contact Primary care Provider  for any questions or concerns going forward.  Kathyrn Sheriff, RN, MSN, BSN, CCM Aging Gracefully Care Management Coordinator [phone]

## 2021-06-17 NOTE — Patient Instructions (Signed)
Goals Addressed               This Visit's Progress     Patient Stated (pt-stated)        Aging Gracefully RN goals:  Goal:  Patient will report decrease in back pain in the next 120 days.  06/17/21 Assessment: patient reports increase in back pain 5/10" today. He states drove to [place] and pain became more aggravated following the drive. Went to PCP office and report he has "irritation L4-L5".  Plan: final visit. Patient instructed to contact PCP for any further questions or concerns      Patient Stated (pt-stated)        Aging Gracefully RN Goals:  Goal:  Patient will report decrease number of low CBG readings in the next 120 days.  06/17/21 Assessment: Reports reduction of low blood sugars reading since start of program. Blood sugar was 140. Lowest blood sugar reading 65 since last encounter, but patient reports 2 BS less than 70 over the past month. Reports he is eating better and more balanced diet.  Plan: Final visit-Patient instructed to contact PCP going forward with any questions or concerns.  Kathyrn Sheriff, RN, MSN, BSN, CCM Aging Gracefully Care Management Coordinator [phone]

## 2021-06-18 ENCOUNTER — Telehealth (HOSPITAL_COMMUNITY): Payer: Self-pay | Admitting: Surgery

## 2021-06-18 NOTE — Telephone Encounter (Signed)
I called patient to check on his pending home sleep study.  He tells me that he will proceed with study this weekend and had "forgotten" about it.

## 2021-06-22 DIAGNOSIS — M4696 Unspecified inflammatory spondylopathy, lumbar region: Secondary | ICD-10-CM | POA: Diagnosis not present

## 2021-06-22 DIAGNOSIS — M5451 Vertebrogenic low back pain: Secondary | ICD-10-CM | POA: Diagnosis not present

## 2021-06-23 ENCOUNTER — Ambulatory Visit: Payer: Medicare Other | Admitting: Cardiology

## 2021-06-25 ENCOUNTER — Ambulatory Visit (HOSPITAL_COMMUNITY)
Admission: RE | Admit: 2021-06-25 | Discharge: 2021-06-25 | Disposition: A | Discharge status: Home / Self Care | Payer: Medicare Other | Source: Ambulatory Visit | Attending: Family Medicine | Admitting: Family Medicine

## 2021-06-25 ENCOUNTER — Encounter (HOSPITAL_COMMUNITY): Payer: Self-pay

## 2021-06-25 VITALS — BP 108/60 | HR 70 | Wt 354.2 lb

## 2021-06-25 DIAGNOSIS — R0602 Shortness of breath: Secondary | ICD-10-CM | POA: Insufficient documentation

## 2021-06-25 DIAGNOSIS — I5022 Chronic systolic (congestive) heart failure: Secondary | ICD-10-CM | POA: Insufficient documentation

## 2021-06-25 DIAGNOSIS — R0683 Snoring: Secondary | ICD-10-CM | POA: Insufficient documentation

## 2021-06-25 DIAGNOSIS — I428 Other cardiomyopathies: Secondary | ICD-10-CM | POA: Insufficient documentation

## 2021-06-25 DIAGNOSIS — Z6841 Body Mass Index (BMI) 40.0 and over, adult: Secondary | ICD-10-CM | POA: Diagnosis not present

## 2021-06-25 DIAGNOSIS — Z794 Long term (current) use of insulin: Secondary | ICD-10-CM | POA: Diagnosis not present

## 2021-06-25 DIAGNOSIS — I442 Atrioventricular block, complete: Secondary | ICD-10-CM

## 2021-06-25 DIAGNOSIS — D860 Sarcoidosis of lung: Secondary | ICD-10-CM | POA: Diagnosis not present

## 2021-06-25 DIAGNOSIS — E669 Obesity, unspecified: Secondary | ICD-10-CM | POA: Insufficient documentation

## 2021-06-25 DIAGNOSIS — I10 Essential (primary) hypertension: Secondary | ICD-10-CM | POA: Diagnosis not present

## 2021-06-25 DIAGNOSIS — Z7901 Long term (current) use of anticoagulants: Secondary | ICD-10-CM | POA: Diagnosis not present

## 2021-06-25 DIAGNOSIS — I110 Hypertensive heart disease with heart failure: Secondary | ICD-10-CM | POA: Diagnosis not present

## 2021-06-25 DIAGNOSIS — E119 Type 2 diabetes mellitus without complications: Secondary | ICD-10-CM | POA: Insufficient documentation

## 2021-06-25 DIAGNOSIS — Z7984 Long term (current) use of oral hypoglycemic drugs: Secondary | ICD-10-CM | POA: Insufficient documentation

## 2021-06-25 DIAGNOSIS — I480 Paroxysmal atrial fibrillation: Secondary | ICD-10-CM | POA: Insufficient documentation

## 2021-06-25 DIAGNOSIS — K625 Hemorrhage of anus and rectum: Secondary | ICD-10-CM | POA: Insufficient documentation

## 2021-06-25 DIAGNOSIS — Z79899 Other long term (current) drug therapy: Secondary | ICD-10-CM | POA: Diagnosis not present

## 2021-06-25 DIAGNOSIS — Z95810 Presence of automatic (implantable) cardiac defibrillator: Secondary | ICD-10-CM | POA: Insufficient documentation

## 2021-06-25 LAB — BASIC METABOLIC PANEL
Anion gap: 7 (ref 5–15)
BUN: 6 mg/dL — ABNORMAL LOW (ref 8–23)
CO2: 23 mmol/L (ref 22–32)
Calcium: 10.1 mg/dL (ref 8.9–10.3)
Chloride: 108 mmol/L (ref 98–111)
Creatinine, Ser: 0.87 mg/dL (ref 0.61–1.24)
GFR, Estimated: >60 mL/min (ref >60)
Glucose, Bld: 135 mg/dL — ABNORMAL HIGH (ref 70–99)
Potassium: 4.1 mmol/L (ref 3.5–5.1)
Sodium: 138 mmol/L (ref 135–145)

## 2021-06-25 LAB — CBC
HCT: 40.1 % (ref 39.0–52.0)
Hemoglobin: 13.2 g/dL (ref 13.0–17.0)
MCH: 28.5 pg (ref 26.0–34.0)
MCHC: 32.9 g/dL (ref 30.0–36.0)
MCV: 86.6 fL (ref 80.0–100.0)
Platelets: 249 10*3/uL (ref 150–400)
RBC: 4.63 MIL/uL (ref 4.22–5.81)
RDW: 12.2 % (ref 11.5–15.5)
WBC: 7.3 10*3/uL (ref 4.0–10.5)
nRBC: 0 % (ref 0.0–0.2)

## 2021-06-25 LAB — BRAIN NATRIURETIC PEPTIDE: B Natriuretic Peptide: 185.3 pg/mL — ABNORMAL HIGH (ref 0.0–100.0)

## 2021-06-25 MED ORDER — POTASSIUM CHLORIDE CRYS ER 20 MEQ PO TBCR: 20.0000 meq | EXTENDED_RELEASE_TABLET | ORAL | 6 refills | Status: DC | PRN

## 2021-06-25 NOTE — Progress Notes (Signed)
ReDS Vest / Clip - 06/25/21 1400       ReDS Vest / Clip   Station Marker D    Ruler Value 47    ReDS Value Range Low volume    ReDS Actual Value 31

## 2021-06-25 NOTE — Progress Notes (Addendum)
Advanced Heart Failure Clinic Note  Primary Care: Georgann Housekeeper, MD Primary Cardiologist: [name], MD HF Cardiologist: Dr. Gala Romney  HPI: [name] is a 65 y.o. male with a obesity, systolic HF due to NICM (normal coronary arteries by cath 1996), pulmonary sarcoidosis (biopsy proven), DM2, HTN,  OSA (intolerant of CPAP), CHB s/p dual chamber AICD and SVT s/p ablation.  Referred by Dr. [name] for further evaluation of his HF.   Diagnosed with pulmonary sarcoid in 1992 or 1993 (biopsy proven). Treated with steroids for several months. Told it was burned out.  Had SVT ablation c/b CHB and underwent PPM placement   Had cath in 1996. Normal coronary arteries.    Myoview 2011 EF 28% large inferior scar Echo 2013 EF 35-40%  Echo 2015 15-20%  Myoview 2017. EF 20% large scar Echo 2019 EF 15-20% Echo 1/23 EF 15-20%   St. Jude BiV ICD implanted 1996, upgrade 2011, gen change 2020 for CHF (LV port capped). He has previously been offered CRT upgrade and has declined  Follow up 3/23, arranged for [facility] and sleep study. R/LHC (4/23) showed normal cors, EF 20%, well compensated hemodynamics except for reduced PAPi.  Today he returns for HF follow up. Overall feeling fine, main complaint is back issues. He has SOB if he does too much, no issues walking on flat ground. Denies palpitations, CP, dizziness, edema, or PND/Orthopnea. Appetite ok. No fever or chills. Weight at home 349 pounds. Taking all medications. Has not needed to take extra lasix.  Cardiac Studies: - R/LHC (4/23):   The left ventricular ejection fraction is less than 25% by visual estimate. Ao = 107/62 (80) LV = 103/17 RA = 12 RV = 36/12 PA = 35/17 (24) PCW = 13 Fick cardiac output/index = 7.9/3.0 PVR = 1.4 WU SVR = 665  FA sat = 98% PA sat = 71%, 74% PAPi = 1.5 Assessment: 1. Normal coronary arteries 2. Severe NICM EF 20% 3. Relatively well compensated hemodynamics except for reduced PAPi  - Echo  1/23 EF 15-20%   - Echo 2019 EF 15-20%  - Myoview 2017. EF 20% large scar  - Echo 2015 15-20%   - Echo 2013 EF 35-40%   - Myoview 2011 EF 28% large inferior scar  Past Medical History:  Diagnosis Date   Asthma    Automatic implantable cardioverter-defibrillator in situ    Back pain    Chronic systolic CHF (congestive heart failure), NYHA class 3 (HCC)    Complete heart block (HCC)    s/p PPM 1997 with upgrade to AICD 2011   Depression    Dilated aortic root (HCC)    6mm ascending aortic root   DM (diabetes mellitus) (HCC)    Glaucoma    HTN (hypertension)    Hyperlipidemia    Nonischemic cardiomyopathy (HCC)    EF of 15-20% by echo 2015 and 20% by nuclear stress test 2017   Obesity    PAF (paroxysmal atrial fibrillation) (HCC)    13 minutes of PAF documented on ICD check 2018 - anticoagulated with Eliquis   Sarcoid    Sleep apnea 06-28-12   no cpap used-unable to tolerate   SVT (supraventricular tachycardia) (HCC)    s/p ablation   Current Outpatient Medications  Medication Sig Dispense Refill   albuterol (PROVENTIL HFA;VENTOLIN HFA) 108 (90 BASE) MCG/ACT inhaler Inhale 2 puffs into the lungs every 4 (four) hours as needed for wheezing. For shortness of breath     furosemide (LASIX)  40 MG tablet Take 1 tablet (40 mg total) by mouth daily. 90 tablet 3   JARDIANCE 25 MG TABS tablet Take 25 mg by mouth daily.     metoprolol succinate (TOPROL-XL) 50 MG 24 hr tablet Take 1 tablet (50 mg total) by mouth daily. Take with or immediately following a meal. 90 tablet 3   rivaroxaban (XARELTO) 20 MG TABS tablet Take 1 tablet (20 mg total) by mouth daily with supper. 30 tablet 11   rosuvastatin (CRESTOR) 20 MG tablet Take 20 mg by mouth daily.     sacubitril-valsartan (ENTRESTO) 97-103 MG Take 1 tablet by mouth 2 (two) times daily. 180 tablet 3   spironolactone (ALDACTONE) 25 MG tablet Take 1 tablet (25 mg total) by mouth daily. 90 tablet 3   SYMBICORT 80-4.5 MCG/ACT inhaler Inhale  2 puffs into the lungs 2 (two) times daily as needed (asthma).     tiZANidine (ZANAFLEX) 4 MG capsule Take 4 mg by mouth at bedtime as needed for muscle spasms.     traMADol (ULTRAM) 50 MG tablet Take 50 mg by mouth every 6 (six) hours as needed. X 14 days     TRESIBA FLEXTOUCH 200 UNIT/ML SOPN Inject 80 Units into the skin every morning.     No current facility-administered medications for this encounter.   Facility-Administered Medications Ordered in Other Encounters  Medication Dose Route Frequency Provider Last Rate Last Admin   technetium pyrophosphate Tc 31m injection 21.1 millicurie  21.1 millicurie Intravenous Once Lars Masson, MD       Allergies  Allergen Reactions   Penicillins Other (See Comments)    Dizziness Did it involve swelling of the face/tongue/throat, SOB, or low BP? Yes Did it involve sudden or severe rash/hives, skin peeling, or any reaction on the inside of your mouth or nose? No Did you need to seek medical attention at a hospital or doctor's office? Yes When did it last happen?      childhood If all above answers are "NO", may proceed with cephalosporin use.    Social History   Socioeconomic History   Marital status: Legally Separated    Spouse name: Not on file   Number of children: 2   Years of education: GED   Highest education level: Not on file  Occupational History   Not on file  Tobacco Use   Smoking status: Never   Smokeless tobacco: Never  Vaping Use   Vaping Use: Never used  Substance and Sexual Activity   Alcohol use: Not Currently    Comment: occasionally   Drug use: No   Sexual activity: Yes  Other Topics Concern   Not on file  Social History Narrative   Not on file   Social Determinants of Health   Financial Resource Strain: Low Risk  (03/25/2021)   Overall Financial Resource Strain (CARDIA)    Difficulty of Paying Living Expenses: Not very hard  Food Insecurity: No Food Insecurity (03/25/2021)   Hunger Vital Sign     Worried About Running Out of Food in the Last Year: Never true    Ran Out of Food in the Last Year: Never true  Transportation Needs: No Transportation Needs (03/25/2021)   PRAPARE - Administrator, Civil Service (Medical): No    Lack of Transportation (Non-Medical): No  Physical Activity: Not on file  Stress: Not on file  Social Connections: Not on file  Intimate Partner Violence: Not on file   Family History  Problem Relation  Age of Onset   Diabetes Mother    Alzheimer's disease Mother    CVA Father    Heart failure Father    Epilepsy Sister    Diabetes Brother    Diabetes Sister    Wt Readings from Last 3 Encounters:  06/25/21 (!) 160.7 kg (354 lb 3.2 oz)  04/14/21 (!) 156.5 kg (345 lb)  03/25/21 (!) 157.9 kg (348 lb 2 oz)   BP 108/60   Pulse 70   Wt (!) 160.7 kg (354 lb 3.2 oz)   SpO2 96%   BMI 50.82 kg/m   PHYSICAL EXAM: General:  NAD. No resp difficulty HEENT: Normal Neck: Supple. No JVD. Carotids 2+ bilat; no bruits. No lymphadenopathy or thryomegaly appreciated. Cor: PMI nondisplaced. Regular rate & rhythm. No rubs, gallops or murmurs. Lungs: Clear Abdomen: Soft, nontender, nondistended. No hepatosplenomegaly. No bruits or masses. Good bowel sounds. Extremities: No cyanosis, clubbing, rash, edema Neuro: Alert & oriented x 3, cranial nerves grossly intact. Moves all 4 extremities w/o difficulty. Affect pleasant.  ECG (personally reviewed): NSR, LBBB, v-paced 70 bpm, QRS 162 msec  ReDs: 31%  ASSESSMENT & PLAN: 1.  Chronic systolic CHF  - longstanding LV dysfunction - Etiology remains unclear - last cath 1996 with normal coronaries however subsequent Myoview have suggested possible underlying iHD - which I would suspect given his CRFs. Also has chronic RV pacing - s/p SJ BiVICD (LV lead capped) - has refused upgrade to CRT - R/LHC (4/23) normal cors, EF 20% NICM, well compensated hemodynamics, except low PAPi; RA 12, PA 35/17 (24), PCW 13, CO/CI  (Fick) 7.9/3.0, PAPi 1.5 - Improved NYHA II. Volume status looks good, REDs 31%.  - Continue Entresto 97-103 mg bid. - Continue Toprol XL 50 mg daily. - Continue spiro 25 mg daily. - Continue Jardiance 25 mg daily. - Continue Lasix 40 mg daily. OK to take extra 40 mg PRN w/ 20 KCL PRN. 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  - Doubt cardiac sarcoid.  - PYP scan 4/20 equivocal  for amyloid. urine and protein electrophoresis were normal. I reviewed PYP and agree that it is equivocal. Can repeat as needed. - Unable to get cMRI with ICD - Labs today.   2. HTN  - Blood pressure well controlled.  - Continue current regimen.  3. CHB   - Resulted from SVT ablation - he has STJ BiV ICD in place but LV lead capped. Has refused CRT upgrade. May need to re-address - see above   4. PAF  - he was noted on ICD check in 2018 to have PAF. - Remains in NSR on ECG today. - Continue Xarelto. Mild occasional rectal bleeding with BMs. - CBC today.   5. Snoring - He has not completed his home sleep study, I asked him to do this tonight.  Follow up in 3-4 months with Dr. Leory Plowman.  Jacklynn Ganong, FNP  2:09 PM

## 2021-06-25 NOTE — Patient Instructions (Addendum)
Thank you for coming in today  Labs were done today, if any labs are abnormal the clinic will call you No news is good news  COMPLETE SLEEP STUDY TONIGHT  Your physician recommends that you schedule a follow-up appointment in:  3-4 months with Dr. Gala Romney  Take Potassium 20 meq with Lasix dose only.  At the [facility], you and your health needs are our priority. As part of our continuing mission to provide you with exceptional heart care, we have created designated Provider Care Teams. These Care Teams include your primary Cardiologist (physician) and Advanced Practice Providers (APPs- Physician Assistants and Nurse Practitioners) who all work together to provide you with the care you need, when you need it.   You may see any of the following providers on your designated Care Team at your next follow up: Dr Arvilla Meres Dr Carron Curie, NP Robbie Lis, [place] [facility], [place] Karle Plumber, PharmD   Please be sure to bring in all your medications bottles to every appointment.   If you have any questions or concerns before your next appointment please send [place] a message through [place] or call our office at [phone].    TO LEAVE A MESSAGE FOR THE NURSE SELECT OPTION 2, PLEASE LEAVE A MESSAGE INCLUDING: YOUR NAME DATE OF BIRTH CALL BACK NUMBER REASON FOR CALL**this is important as we prioritize the call backs  YOU WILL RECEIVE A CALL BACK THE SAME DAY AS LONG AS YOU CALL BEFORE 4:00 PM

## 2021-06-25 NOTE — Addendum Note (Signed)
Encounter addended by: Jacklynn Ganong, FNP on: 06/25/2021 2:44 PM  Actions taken: Clinical Note Signed

## 2021-06-26 ENCOUNTER — Encounter (INDEPENDENT_AMBULATORY_CARE_PROVIDER_SITE_OTHER): Payer: Medicare Other | Admitting: Cardiology

## 2021-06-26 DIAGNOSIS — G4733 Obstructive sleep apnea (adult) (pediatric): Secondary | ICD-10-CM | POA: Diagnosis not present

## 2021-06-28 DIAGNOSIS — M47816 Spondylosis without myelopathy or radiculopathy, lumbar region: Secondary | ICD-10-CM | POA: Diagnosis not present

## 2021-06-28 NOTE — Procedures (Signed)
   SLEEP STUDY REPORT Patient Information Study Date: 06/26/21 Patient Name: [name] Patient ID: [id number] Birth Date: 08-29-2055 Age: 66 Gender: Male Referring Physician:Jessica Milford, FNP  TEST DESCRIPTION: Home sleep apnea testing was completed using the WatchPat, a Type 1 device, utilizing peripheral arterial tonometry (PAT), chest movement, actigraphy, pulse oximetry, pulse rate, body position and snore. AHI was calculated with apnea and hypopnea using valid sleep time as the denominator. RDI includes apneas, hypopneas, and RERAs. The data acquired and the scoring of sleep and all associated events were performed in accordance with the recommended standards and specifications as outlined in the AASM Manual for the Scoring of Sleep and Associated Events 2.2.0 (2015).  FINDINGS: 1. Moderate Obstructive Sleep Apnea with AHI 21.2/hr. 2. No Central Sleep Apnea with pAHIc 0.5/hr. 3. Oxygen desaturations as low as 82%. 4. Mild to mdoerate snoring was present. O2 sats were < 88% for 0.8 min. 5. Total sleep time was 6 hrs and 7 min. 6. 15.8% of total sleep time was spent in REM sleep. 7. Shortened sleep onset latency at 5 min 8. Normal REM sleep onset latency at 74 min. 9. Total awakenings were 5.  DIAGNOSIS: Moderate Obstructive Sleep Apnea (G47.33)  RECOMMENDATIONS: 1. Clinical correlation of these findings is necessary. The decision to treat obstructive sleep apnea (OSA) is usually based on the presence of apnea symptoms or the presence of associated medical conditions such as Hypertension, Congestive Heart Failure, Atrial Fibrillation or Obesity. The most common symptoms of OSA are snoring, gasping for breath while sleeping, daytime sleepiness and fatigue.  2. Initiating apnea therapy is recommended given the presence of symptoms and/or associated conditions. Recommend proceeding with one of the following:   a. Auto-CPAP therapy with a pressure range of 5-20cm  H2O.   b. An oral appliance (OA) that can be obtained from certain dentists with expertise in sleep medicine. These are primarily of use in non-obese patients with mild and moderate disease.   c. An ENT consultation which may be useful to look for specific causes of obstruction and possible treatment options.   d. If patient is intolerant to PAP therapy, consider referral to ENT for evaluation for hypoglossal nerve stimulator.  3. Close follow-up is necessary to ensure success with CPAP or oral appliance therapy for maximum benefit .  4. A follow-up oximetry study on CPAP is recommended to assess the adequacy of therapy and determine the need for supplemental oxygen or the potential need for Bi-level therapy. An arterial blood gas to determine the adequacy of baseline ventilation and oxygenation should also be considered.  5. Healthy sleep recommendations include: adequate nightly sleep (normal 7-9 hrs/night), avoidance of caffeine after noon and alcohol near bedtime, and maintaining a sleep environment that is cool, dark and quiet.  6. Weight loss for overweight patients is recommended. Even modest amounts of weight loss can significantly improve the severity of sleep apnea.  7. Snoring recommendations include: weight loss where appropriate, side sleeping, and avoidance of alcohol before bed.  8. Operation of motor vehicle should not be performed when sleepy.  Signature: Electronically Signed: 06/28/21 [name], MD; [facility]; Diplomat, American Board of Sleep Medicine Report prepared by: [name]

## 2021-07-03 ENCOUNTER — Other Ambulatory Visit: Payer: Self-pay | Admitting: Cardiology

## 2021-07-03 DIAGNOSIS — I5022 Chronic systolic (congestive) heart failure: Secondary | ICD-10-CM

## 2021-07-06 ENCOUNTER — Telehealth: Payer: Self-pay | Admitting: *Deleted

## 2021-07-06 ENCOUNTER — Ambulatory Visit: Payer: Medicare Other | Admitting: Cardiology

## 2021-07-06 NOTE — Telephone Encounter (Signed)
The patient has been notified of the result and verbalized understanding.  All questions (if any) were answered. [name], CMA 07/06/2021 5:09 PM    Patient has declines to get a cpap because he had one before and he did not wear it either.

## 2021-07-08 ENCOUNTER — Ambulatory Visit: Payer: Medicare Other

## 2021-07-08 DIAGNOSIS — G4730 Sleep apnea, unspecified: Secondary | ICD-10-CM

## 2021-07-09 ENCOUNTER — Telehealth: Payer: Self-pay | Admitting: *Deleted

## 2021-07-09 ENCOUNTER — Ambulatory Visit: Payer: Medicare Other | Admitting: Nurse Practitioner

## 2021-07-09 NOTE — Telephone Encounter (Signed)
S/w pt is aware Lebron Conners stated pt does not need to come in today for appt due to being followed by CHF clinic and Dr. Ladona Ridgel.

## 2021-07-15 DIAGNOSIS — M47817 Spondylosis without myelopathy or radiculopathy, lumbosacral region: Secondary | ICD-10-CM | POA: Diagnosis not present

## 2021-07-15 DIAGNOSIS — M47816 Spondylosis without myelopathy or radiculopathy, lumbar region: Secondary | ICD-10-CM | POA: Diagnosis not present

## 2021-08-09 ENCOUNTER — Other Ambulatory Visit: Payer: Self-pay

## 2021-08-09 NOTE — Patient Outreach (Signed)
Aging Gracefully Program  08/09/2021  [name] [birth year] [id number]   [facility] Evaluation Interviewer made contact with patient. Aging Gracefully 5 month survey completed.   Sent email of completion to [facility]   [facility] Assistant [phone]

## 2021-08-18 ENCOUNTER — Other Ambulatory Visit: Payer: Self-pay | Admitting: Cardiology

## 2021-08-27 ENCOUNTER — Ambulatory Visit (INDEPENDENT_AMBULATORY_CARE_PROVIDER_SITE_OTHER): Payer: Medicare Other

## 2021-08-27 DIAGNOSIS — I428 Other cardiomyopathies: Secondary | ICD-10-CM

## 2021-08-30 LAB — CUP PACEART REMOTE DEVICE CHECK
Battery Remaining Longevity: 52 mo
Battery Remaining Percentage: 56 %
Battery Voltage: 2.95 V
Brady Statistic AP VP Percent: 20 %
Brady Statistic AP VS Percent: 1 %
Brady Statistic AS VP Percent: 79 %
Brady Statistic AS VS Percent: 1 %
Brady Statistic RA Percent Paced: 19 %
Brady Statistic RV Percent Paced: 99 %
Date Time Interrogation Session: 20230818020031
HighPow Impedance: 49 Ohm
HighPow Impedance: 49 Ohm
Implantable Lead Implant Date: 19961213
Implantable Lead Implant Date: 20111202
Implantable Lead Location: 753859
Implantable Lead Location: 753860
Implantable Lead Model: 5524
Implantable Lead Model: 7121
Implantable Pulse Generator Implant Date: 20200518
Lead Channel Impedance Value: 350 Ohm
Lead Channel Impedance Value: 490 Ohm
Lead Channel Pacing Threshold Amplitude: 0.75 V
Lead Channel Pacing Threshold Amplitude: 0.875 V
Lead Channel Pacing Threshold Pulse Width: 0.5 ms
Lead Channel Pacing Threshold Pulse Width: 0.5 ms
Lead Channel Sensing Intrinsic Amplitude: 1.8 mV
Lead Channel Sensing Intrinsic Amplitude: 9.3 mV
Lead Channel Setting Pacing Amplitude: 2 V
Lead Channel Setting Pacing Amplitude: 2 V
Lead Channel Setting Pacing Pulse Width: 0.5 ms
Lead Channel Setting Sensing Sensitivity: 0.5 mV
Pulse Gen Serial Number: 9829382

## 2021-09-09 ENCOUNTER — Other Ambulatory Visit: Payer: Self-pay | Admitting: Cardiology

## 2021-09-09 NOTE — Telephone Encounter (Signed)
This is a CHF pt Dr. Gala Romney

## 2021-09-22 NOTE — Progress Notes (Signed)
Remote ICD transmission.   

## 2021-09-29 ENCOUNTER — Encounter: Payer: Medicare Other | Admitting: Internal Medicine

## 2021-10-08 ENCOUNTER — Ambulatory Visit (HOSPITAL_COMMUNITY)
Admission: RE | Admit: 2021-10-08 | Discharge: 2021-10-08 | Disposition: A | Discharge status: Home / Self Care | Payer: Medicare Other | Source: Ambulatory Visit | Attending: Internal Medicine | Admitting: Internal Medicine

## 2021-10-08 VITALS — BP 100/70 | HR 74 | Wt 357.0 lb

## 2021-10-08 DIAGNOSIS — E669 Obesity, unspecified: Secondary | ICD-10-CM | POA: Diagnosis not present

## 2021-10-08 DIAGNOSIS — I442 Atrioventricular block, complete: Secondary | ICD-10-CM | POA: Insufficient documentation

## 2021-10-08 DIAGNOSIS — Z7984 Long term (current) use of oral hypoglycemic drugs: Secondary | ICD-10-CM | POA: Insufficient documentation

## 2021-10-08 DIAGNOSIS — I480 Paroxysmal atrial fibrillation: Secondary | ICD-10-CM | POA: Insufficient documentation

## 2021-10-08 DIAGNOSIS — Z91148 Patient's other noncompliance with medication regimen for other reason: Secondary | ICD-10-CM | POA: Diagnosis not present

## 2021-10-08 DIAGNOSIS — Z7901 Long term (current) use of anticoagulants: Secondary | ICD-10-CM | POA: Diagnosis not present

## 2021-10-08 DIAGNOSIS — D860 Sarcoidosis of lung: Secondary | ICD-10-CM | POA: Diagnosis not present

## 2021-10-08 DIAGNOSIS — E119 Type 2 diabetes mellitus without complications: Secondary | ICD-10-CM | POA: Diagnosis not present

## 2021-10-08 DIAGNOSIS — Z95810 Presence of automatic (implantable) cardiac defibrillator: Secondary | ICD-10-CM

## 2021-10-08 DIAGNOSIS — K625 Hemorrhage of anus and rectum: Secondary | ICD-10-CM | POA: Insufficient documentation

## 2021-10-08 DIAGNOSIS — I5022 Chronic systolic (congestive) heart failure: Secondary | ICD-10-CM | POA: Insufficient documentation

## 2021-10-08 DIAGNOSIS — I110 Hypertensive heart disease with heart failure: Secondary | ICD-10-CM | POA: Insufficient documentation

## 2021-10-08 DIAGNOSIS — R0683 Snoring: Secondary | ICD-10-CM | POA: Insufficient documentation

## 2021-10-08 DIAGNOSIS — I428 Other cardiomyopathies: Secondary | ICD-10-CM | POA: Diagnosis not present

## 2021-10-08 DIAGNOSIS — G4730 Sleep apnea, unspecified: Secondary | ICD-10-CM | POA: Diagnosis not present

## 2021-10-08 DIAGNOSIS — G4733 Obstructive sleep apnea (adult) (pediatric): Secondary | ICD-10-CM | POA: Insufficient documentation

## 2021-10-08 DIAGNOSIS — I471 Supraventricular tachycardia: Secondary | ICD-10-CM | POA: Diagnosis not present

## 2021-10-08 DIAGNOSIS — I10 Essential (primary) hypertension: Secondary | ICD-10-CM

## 2021-10-08 DIAGNOSIS — Z79899 Other long term (current) drug therapy: Secondary | ICD-10-CM | POA: Diagnosis not present

## 2021-10-08 NOTE — Progress Notes (Signed)
Advanced Heart Failure Clinic Note  Primary Care: Wenda Low, MD Primary Cardiologist: Fransico Him, MD HF Cardiologist: Dr. Haroldine Laws  HPI: [name] is a 66 y.o. male with a obesity, systolic HF due to NICM (normal coronary arteries by cath 1996), pulmonary sarcoidosis (biopsy proven), DM2, HTN,  OSA (intolerant of CPAP), CHB s/p dual chamber AICD and SVT s/p ablation.  Referred by Dr. Radford Pax for further evaluation of his HF.   Diagnosed with pulmonary sarcoid in 1992 or 1993 (biopsy proven). Treated with steroids for several months. Told it was burned out.  Had SVT ablation c/b CHB and underwent PPM placement   Cath in 1996. Normal coronary arteries.    Myoview 2011 EF 28% large inferior scar Echo 2013 EF 35-40%  Echo 2015 15-20%  Myoview 2017. EF 20% large scar Echo 2019 EF 15-20% Echo 1/23 EF 15-20%   St. Jude BiV ICD implanted 1996, upgrade 2011, gen change 2020 for CHF (LV port capped). He has previously been offered CRT upgrade and has declined  Follow up 3/23. Arranged L/RHC and sleep study. R/LHC (4/23) showed normal cors, EF 20%, well compensated hemodynamics except for reduced PAPi.  Today he returns for HF follow up. Overall feeling fine. Tries to be compliant but doesn't take meds fully as prescribed. Lasix takes 2-3x/week rather than daily. No SOB unless long walk or significant exertion. Denies palpitations, CP, dizziness, edema, or PND/Orthopnea. Appetite ok. No fever or chills. Weight at home 350 pounds.   ICD interrogation today: No VT/VF. AT/AF burden <1% PMT occurred. RVP 98%, AP 18%  (Personally reviewed)   Cardiac Studies: 6/23:  Sleep study: revealed moderate sleep apnea.   - R/LHC (4/23):   The left ventricular ejection fraction is less than 25% by visual estimate. Ao = 107/62 (80) LV = 103/17 RA = 12 RV = 36/12 PA = 35/17 (24) PCW = 13 Fick cardiac output/index = 7.9/3.0 PVR = 1.4 WU SVR = 665  FA sat = 98% PA sat = 71%,  74% PAPi = 1.5 Assessment: 1. Normal coronary arteries 2. Severe NICM EF 20% 3. Relatively well compensated hemodynamics except for reduced PAPi  - Echo 1/23 EF 15-20%  - Echo 2019 EF 15-20% - Myoview 2017. EF 20% large scar - Echo 2015 15-20%  - Echo 2013 EF 35-40%  - Myoview 2011 EF 28% large inferior scar  Past Medical History:  Diagnosis Date   Asthma    Automatic implantable cardioverter-defibrillator in situ    Back pain    Chronic systolic CHF (congestive heart failure), NYHA class 3 (HCC)    Complete heart block ([place])    s/p PPM 1997 with upgrade to AICD 2011   Depression    Dilated aortic root ([place])    33mm ascending aortic root   DM (diabetes mellitus) ([place])    Glaucoma    HTN (hypertension)    Hyperlipidemia    Nonischemic cardiomyopathy (HCC)    EF of 15-20% by echo 2015 and 20% by nuclear stress test 2017   Obesity    PAF (paroxysmal atrial fibrillation) (HCC)    13 minutes of PAF documented on ICD check 2018 - anticoagulated with Eliquis   Sarcoid    Sleep apnea 06-28-12   no cpap used-unable to tolerate   SVT (supraventricular tachycardia) (HCC)    s/p ablation   Current Outpatient Medications  Medication Sig Dispense Refill   albuterol (PROVENTIL HFA;VENTOLIN HFA) 108 (90 BASE) MCG/ACT inhaler Inhale 2 puffs into the  lungs every 4 (four) hours as needed for wheezing. For shortness of breath     ENTRESTO 97-103 MG TAKE 1 TABLET BY MOUTH TWICE A DAY 180 tablet 3   furosemide (LASIX) 40 MG tablet TAKE 1 TABLET BY MOUTH EVERY DAY 90 tablet 3   JARDIANCE 25 MG TABS tablet Take 25 mg by mouth daily.     metoprolol succinate (TOPROL-XL) 50 MG 24 hr tablet TAKE 1 TABLET BY MOUTH DAILY WITH OR IMMEDIATELY FOLLOWING A MEAL 30 tablet 0   potassium chloride SA (KLOR-CON M) 20 MEQ tablet Take 1 tablet (20 mEq total) by mouth as needed. With lasix dose only 30 tablet 6   rivaroxaban (XARELTO) 20 MG TABS tablet Take 1 tablet (20 mg total) by mouth daily with supper. 30  tablet 11   rosuvastatin (CRESTOR) 20 MG tablet Take 20 mg by mouth daily.     spironolactone (ALDACTONE) 25 MG tablet TAKE 1 TABLET (25 MG TOTAL) BY MOUTH DAILY. 90 tablet 3   tiZANidine (ZANAFLEX) 4 MG capsule Take 4 mg by mouth at bedtime as needed for muscle spasms.     TRESIBA FLEXTOUCH 200 UNIT/ML SOPN Inject 80 Units into the skin every morning.     SYMBICORT 80-4.5 MCG/ACT inhaler Inhale 2 puffs into the lungs 2 (two) times daily as needed (asthma). (Patient not taking: Reported on 10/08/2021)     traMADol (ULTRAM) 50 MG tablet Take 50 mg by mouth every 6 (six) hours as needed. X 14 days (Patient not taking: Reported on 10/08/2021)     No current facility-administered medications for this encounter.   Facility-Administered Medications Ordered in Other Encounters  Medication Dose Route Frequency Provider Last Rate Last Admin   technetium pyrophosphate Tc 4m injection 21.1 millicurie  21.1 millicurie Intravenous Once Lars Masson, MD       Allergies  Allergen Reactions   Penicillins Other (See Comments)    Dizziness Did it involve swelling of the face/tongue/throat, SOB, or low BP? Yes Did it involve sudden or severe rash/hives, skin peeling, or any reaction on the inside of your mouth or nose? No Did you need to seek medical attention at a hospital or doctor's office? Yes When did it last happen?      childhood If all above answers are "NO", may proceed with cephalosporin use.    Social History   Socioeconomic History   Marital status: Legally Separated    Spouse name: Not on file   Number of children: 2   Years of education: GED   Highest education level: Not on file  Occupational History   Not on file  Tobacco Use   Smoking status: Never   Smokeless tobacco: Never  Vaping Use   Vaping Use: Never used  Substance and Sexual Activity   Alcohol use: Not Currently    Comment: occasionally   Drug use: No   Sexual activity: Yes  Other Topics Concern   Not on file   Social History Narrative   Not on file   Social Determinants of Health   Financial Resource Strain: Low Risk  (03/25/2021)   Overall Financial Resource Strain (CARDIA)    Difficulty of Paying Living Expenses: Not very hard  Food Insecurity: No Food Insecurity (03/25/2021)   Hunger Vital Sign    Worried About Running Out of Food in the Last Year: Never true    Ran Out of Food in the Last Year: Never true  Transportation Needs: No Transportation Needs (03/25/2021)  PRAPARE - Administrator, Civil Service (Medical): No    Lack of Transportation (Non-Medical): No  Physical Activity: Not on file  Stress: Not on file  Social Connections: Not on file  Intimate Partner Violence: Not on file   Family History  Problem Relation Age of Onset   Diabetes Mother    Alzheimer's disease Mother    CVA Father    Heart failure Father    Epilepsy Sister    Diabetes Brother    Diabetes Sister    Wt Readings from Last 3 Encounters:  10/08/21 (!) 161.9 kg (357 lb)  06/25/21 (!) 160.7 kg (354 lb 3.2 oz)  04/14/21 (!) 156.5 kg (345 lb)   BP 100/70   Pulse 74   Wt (!) 161.9 kg (357 lb)   SpO2 96%   BMI 51.22 kg/m   PHYSICAL EXAM: General:  well appearing.  No respiratory difficulty HEENT: normal Neck: supple. JVD difficult to assess, thick neck. Carotids 2+ bilat; no bruits. No lymphadenopathy or thyromegaly appreciated. Cor: PMI nondisplaced. Regular rate & rhythm. No rubs, gallops or murmurs. Lungs: clear Abdomen: Obese, soft, nontender, nondistended. No hepatosplenomegaly. No bruits or masses. Good bowel sounds. Extremities: no cyanosis, clubbing, rash, non-pitting edema BLE Neuro: alert & oriented x 3, cranial nerves grossly intact. moves all 4 extremities w/o difficulty. Affect pleasant.   ECG today atrial sensed, v paced 79 bpm, QTc 541  ICD interrogation today: No VT/VF. PMT occurred. RVP 98%, AP 18% AT/AF burden <1% (Personally reviewed)    ASSESSMENT & PLAN: 1.   Chronic systolic CHF  - longstanding LV dysfunction - Etiology remains unclear - last cath 1996 with normal coronaries however subsequent Myoview have suggested possible underlying iHD - which I would suspect given his CRFs. Also has chronic RV pacing - s/p SJ BiVICD (LV lead capped) - has refused upgrade to CRT - R/LHC (4/23) normal cors, EF 20% NICM, well compensated hemodynamics, except low PAPi; RA 12, PA 35/17 (24), PCW 13, CO/CI (Fick) 7.9/3.0, PAPi 1.5 - echo 1/23 EF 20% RV ok  - Improved NYHA II. Volume status slightly elevated 2/2 med noncompliance  - Continue Entresto 97-103 mg bid. - Continue Toprol XL 50 mg daily. - Continue spiro 25 mg daily. - Continue Jardiance 25 mg daily. - Continue Lasix 40 mg daily. OK to take extra 40 mg PRN w/ 20 KCL PRN. 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  - Doubt cardiac sarcoid.  - PYP scan 4/20 equivocal for amyloid. urine and protein electrophoresis were normal. - Unable to get cMRI with ICD   2. HTN  - BP well controlled.  - Continue current regimen  3. CHB   - Resulted from SVT ablation - he has STJ BiV ICD in place but LV lead capped. Has refused CRT upgrade in the past. Willing to see Dr. Ladona Ridgel again for CRT upgrade.    4. PAF  - he was noted on ICD check in 2018 to have PAF. - Remains in NSR on ECG today - Continue Xarelto. Mild occasional rectal bleeding with BMs. - CBC today.   5. Snoring - Sleep study 6/23 revealed: Moderate sleep apnea. Declines wanting to try for CPAP/BiPaP   Alen Bleacher, AGACNP-BC  3:08 PM  Patient seen and examined with the above-signed Advanced Practice Provider and/or Housestaff. I personally reviewed laboratory data, imaging studies and relevant notes. I independently examined the patient and formulated the important aspects of the plan.  I have edited the note to reflect any of my changes or salient points. I have personally discussed the plan with the patient and/or  family.  Doing well clinically. NYHA II despite persistent severe LV dysfunction. Volume status ok   General:  Well appearing. No resp difficulty HEENT: normal Neck: supple. no JVD. Carotids 2+ bilat; no bruits. No lymphadenopathy or thryomegaly appreciated. Cor: PMI laterally displaced. Regular rate & rhythm. No rubs, gallops or murmurs. Lungs: clear Abdomen: soft, nontender, nondistended. No hepatosplenomegaly. No bruits or masses. Good bowel sounds. Extremities: no cyanosis, clubbing, rash, edema Neuro: alert & orientedx3, cranial nerves grossly intact. moves all 4 extremities w/o difficulty. Affect pleasant  ICD interrogated personally 98% VP. 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 He has CRT device in place but LV lead capped, Strongly suggested he follow up with EP to consider CRT. He is willing to see Dr. Ladona Ridgel again. We have arranged f/u. BP well controlled. Volume ok. On excellent GDMT.   Arvilla Meres, MD  10:22 PM

## 2021-10-14 ENCOUNTER — Other Ambulatory Visit: Payer: Self-pay | Admitting: Internal Medicine

## 2021-10-18 ENCOUNTER — Telehealth: Payer: Self-pay

## 2021-10-18 NOTE — Telephone Encounter (Signed)
Pt upcoming 12/10/2021 appointment;  Will communicate this information to Dr. Lovena Le 10/19/21 in clinic.

## 2021-10-18 NOTE — Telephone Encounter (Signed)
Device alert for sustained VT with successful ATP theapy Event occurred 10/6 @ 19:27, EGM shows sustained VT, rate 235, ATP x1 successfully converting to regular AS/VP, event duration 10sec. Route to triage high priority.  Patient called, reports he was driving had brief episode of lightheadedness then felt fine. Denies any other symptoms and reports feeling fine since. Patient reports compliance with medications on file. Advised of [place] DMV driving restrictions x6 months per [place] DMV and shock plan.  Advised pt I will forward to Dr. Lovena Le for review. Patient voiced understanding.

## 2021-10-19 ENCOUNTER — Encounter: Payer: Self-pay | Admitting: Internal Medicine

## 2021-11-01 DIAGNOSIS — I4891 Unspecified atrial fibrillation: Secondary | ICD-10-CM | POA: Diagnosis not present

## 2021-11-01 DIAGNOSIS — D860 Sarcoidosis of lung: Secondary | ICD-10-CM | POA: Diagnosis not present

## 2021-11-01 DIAGNOSIS — E1142 Type 2 diabetes mellitus with diabetic polyneuropathy: Secondary | ICD-10-CM | POA: Diagnosis not present

## 2021-11-01 DIAGNOSIS — D6869 Other thrombophilia: Secondary | ICD-10-CM | POA: Diagnosis not present

## 2021-11-01 DIAGNOSIS — I5022 Chronic systolic (congestive) heart failure: Secondary | ICD-10-CM | POA: Diagnosis not present

## 2021-11-01 DIAGNOSIS — Z23 Encounter for immunization: Secondary | ICD-10-CM | POA: Diagnosis not present

## 2021-11-01 DIAGNOSIS — M479 Spondylosis, unspecified: Secondary | ICD-10-CM | POA: Diagnosis not present

## 2021-11-01 DIAGNOSIS — Z0000 Encounter for general adult medical examination without abnormal findings: Secondary | ICD-10-CM | POA: Diagnosis not present

## 2021-11-01 DIAGNOSIS — I700 Atherosclerosis of aorta: Secondary | ICD-10-CM | POA: Diagnosis not present

## 2021-11-01 DIAGNOSIS — G4733 Obstructive sleep apnea (adult) (pediatric): Secondary | ICD-10-CM | POA: Diagnosis not present

## 2021-11-01 DIAGNOSIS — I77810 Thoracic aortic ectasia: Secondary | ICD-10-CM | POA: Diagnosis not present

## 2021-11-01 DIAGNOSIS — E782 Mixed hyperlipidemia: Secondary | ICD-10-CM | POA: Diagnosis not present

## 2021-11-01 DIAGNOSIS — I10 Essential (primary) hypertension: Secondary | ICD-10-CM | POA: Diagnosis not present

## 2021-11-13 ENCOUNTER — Other Ambulatory Visit (HOSPITAL_COMMUNITY): Payer: Self-pay | Admitting: Family Medicine

## 2021-11-26 ENCOUNTER — Ambulatory Visit (INDEPENDENT_AMBULATORY_CARE_PROVIDER_SITE_OTHER): Payer: Medicare Other

## 2021-11-26 DIAGNOSIS — I428 Other cardiomyopathies: Secondary | ICD-10-CM

## 2021-11-26 LAB — CUP PACEART REMOTE DEVICE CHECK
Battery Remaining Longevity: 49 mo
Battery Remaining Percentage: 53 %
Battery Voltage: 2.93 V
Brady Statistic AP VP Percent: 18 %
Brady Statistic AP VS Percent: 1 %
Brady Statistic AS VP Percent: 81 %
Brady Statistic AS VS Percent: 1 %
Brady Statistic RA Percent Paced: 16 %
Brady Statistic RV Percent Paced: 98 %
Date Time Interrogation Session: 20231117020023
HighPow Impedance: 47 Ohm
HighPow Impedance: 48 Ohm
Implantable Lead Connection Status: 753985
Implantable Lead Connection Status: 753985
Implantable Lead Implant Date: 19961213
Implantable Lead Implant Date: 20111202
Implantable Lead Location: 753859
Implantable Lead Location: 753860
Implantable Lead Model: 5524
Implantable Lead Model: 7121
Implantable Pulse Generator Implant Date: 20200518
Lead Channel Impedance Value: 350 Ohm
Lead Channel Impedance Value: 490 Ohm
Lead Channel Pacing Threshold Amplitude: 0.75 V
Lead Channel Pacing Threshold Amplitude: 0.875 V
Lead Channel Pacing Threshold Pulse Width: 0.5 ms
Lead Channel Pacing Threshold Pulse Width: 0.5 ms
Lead Channel Sensing Intrinsic Amplitude: 11.9 mV
Lead Channel Sensing Intrinsic Amplitude: 2.4 mV
Lead Channel Setting Pacing Amplitude: 2 V
Lead Channel Setting Pacing Amplitude: 2 V
Lead Channel Setting Pacing Pulse Width: 0.5 ms
Lead Channel Setting Sensing Sensitivity: 0.5 mV
Pulse Gen Serial Number: 9829382

## 2021-12-10 ENCOUNTER — Ambulatory Visit: Payer: Medicare Other | Attending: Internal Medicine | Admitting: Internal Medicine

## 2021-12-10 ENCOUNTER — Encounter: Payer: Self-pay | Admitting: Internal Medicine

## 2021-12-10 VITALS — BP 132/70 | HR 160 | Ht 70.0 in | Wt 354.0 lb

## 2021-12-10 DIAGNOSIS — I5022 Chronic systolic (congestive) heart failure: Secondary | ICD-10-CM | POA: Diagnosis not present

## 2021-12-10 DIAGNOSIS — Z95810 Presence of automatic (implantable) cardiac defibrillator: Secondary | ICD-10-CM

## 2021-12-10 DIAGNOSIS — I442 Atrioventricular block, complete: Secondary | ICD-10-CM | POA: Diagnosis not present

## 2021-12-10 LAB — CUP PACEART INCLINIC DEVICE CHECK
Battery Remaining Longevity: 49 mo
Brady Statistic RA Percent Paced: 16 %
Brady Statistic RV Percent Paced: 98 %
Date Time Interrogation Session: 20231201145746
HighPow Impedance: 50.2648
Implantable Lead Connection Status: 753985
Implantable Lead Connection Status: 753985
Implantable Lead Implant Date: 19961213
Implantable Lead Implant Date: 20111202
Implantable Lead Location: 753859
Implantable Lead Location: 753860
Implantable Lead Model: 5524
Implantable Lead Model: 7121
Implantable Pulse Generator Implant Date: 20200518
Lead Channel Impedance Value: 362.5 Ohm
Lead Channel Impedance Value: 512.5 Ohm
Lead Channel Pacing Threshold Amplitude: 1 V
Lead Channel Pacing Threshold Amplitude: 1 V
Lead Channel Pacing Threshold Amplitude: 1 V
Lead Channel Pacing Threshold Amplitude: 1 V
Lead Channel Pacing Threshold Pulse Width: 0.5 ms
Lead Channel Pacing Threshold Pulse Width: 0.5 ms
Lead Channel Pacing Threshold Pulse Width: 0.5 ms
Lead Channel Pacing Threshold Pulse Width: 0.5 ms
Lead Channel Sensing Intrinsic Amplitude: 11.8 mV
Lead Channel Sensing Intrinsic Amplitude: 2.6 mV
Lead Channel Setting Pacing Amplitude: 2 V
Lead Channel Setting Pacing Amplitude: 2 V
Lead Channel Setting Pacing Pulse Width: 0.5 ms
Lead Channel Setting Sensing Sensitivity: 0.5 mV
Pulse Gen Serial Number: 9829382

## 2021-12-10 NOTE — Progress Notes (Signed)
HPI  [name] returns today for followup. He is a morbidly obese 66 yo man with CHB, s/p ICD insertion, non-ischemic CM, and HTN. He has gradually lost weight but slowly. He has gone back to the gym. He denies peripheral edema. He denies chest pain and his dyspnea remains class 2, despite his massive obesity.      Current Outpatient Medications  Medication Sig Dispense Refill   albuterol (PROVENTIL HFA;VENTOLIN HFA) 108 (90 BASE) MCG/ACT inhaler Inhale 2 puffs into the lungs every 4 (four) hours as needed for wheezing. For shortness of breath     ENTRESTO 97-103 MG TAKE 1 TABLET BY MOUTH TWICE A DAY 180 tablet 3   furosemide (LASIX) 40 MG tablet TAKE 1 TABLET BY MOUTH EVERY DAY 90 tablet 3   JARDIANCE 25 MG TABS tablet Take 25 mg by mouth daily.     KLOR-CON M20 20 MEQ tablet TAKE 1 TABLET (20 MEQ TOTAL) BY MOUTH AS NEEDED. WITH LASIX DOSE ONLY 90 tablet 2   metoprolol succinate (TOPROL-XL) 50 MG 24 hr tablet TAKE 1 TABLET BY MOUTH DAILY WITH OR IMMEDIATELY FOLLOWING A MEAL 90 tablet 3   MOUNJARO 2.5 MG/0.5ML Pen Inject 2.5 mg into the skin once a week.     rivaroxaban (XARELTO) 20 MG TABS tablet Take 1 tablet (20 mg total) by mouth daily with supper. 30 tablet 11   rosuvastatin (CRESTOR) 20 MG tablet Take 20 mg by mouth daily.     spironolactone (ALDACTONE) 25 MG tablet TAKE 1 TABLET (25 MG TOTAL) BY MOUTH DAILY. 90 tablet 3   SYMBICORT 80-4.5 MCG/ACT inhaler Inhale 2 puffs into the lungs 2 (two) times daily as needed (asthma).     tiZANidine (ZANAFLEX) 4 MG capsule Take 4 mg by mouth at bedtime as needed for muscle spasms.     traMADol (ULTRAM) 50 MG tablet Take 50 mg by mouth every 6 (six) hours as needed. X 14 days     TRESIBA FLEXTOUCH 200 UNIT/ML SOPN Inject 80 Units into the skin every morning.     No current facility-administered medications for this visit.   Facility-Administered Medications Ordered in Other Visits  Medication Dose Route Frequency Provider Last Rate  Last Admin   technetium pyrophosphate Tc 35m injection 123456 millicurie  123456 millicurie Intravenous Once Dorothy Spark, MD         Past Medical History:  Diagnosis Date   Asthma    Automatic implantable cardioverter-defibrillator in situ    Back pain    Chronic systolic CHF (congestive heart failure), NYHA class 3 ([place])    Complete heart block ([place])    s/p PPM 1997 with upgrade to AICD 2011   Depression    Dilated aortic root ([place])    23mm ascending aortic root   DM (diabetes mellitus) ([place])    Glaucoma    HTN (hypertension)    Hyperlipidemia    Nonischemic cardiomyopathy (HCC)    EF of 15-20% by echo 2015 and 20% by nuclear stress test 2017   Obesity    PAF (paroxysmal atrial fibrillation) (HCC)    13 minutes of PAF documented on ICD check 2018 - anticoagulated with Eliquis   Sarcoid    Sleep apnea 06-28-12   no cpap used-unable to tolerate   SVT (supraventricular tachycardia)    s/p ablation    ROS:   All systems reviewed and negative except as noted in the HPI.   Past Surgical History:  Procedure Laterality  Date   COLONOSCOPY WITH PROPOFOL N/A 07/17/2012   Procedure: COLONOSCOPY WITH PROPOFOL;  Surgeon: Garlan Fair, MD;  Location: [facility];  Service: Endoscopy;  Laterality: N/A;   HEMORRHOID SURGERY     early 20's   ICD GENERATOR CHANGEOUT N/A 05/28/2018   Procedure: ICD GENERATOR CHANGEOUT;  Surgeon: Evans Lance, MD;  Location: [facility];  Service: Cardiovascular;  Laterality: N/A;   PACEMAKER INSERTION     RIGHT/LEFT HEART CATH AND CORONARY ANGIOGRAPHY N/A 04/14/2021   Procedure: RIGHT/LEFT HEART CATH AND CORONARY ANGIOGRAPHY;  Surgeon: Jolaine Artist, MD;  Location: [facility];  Service: Cardiovascular;  Laterality: N/A;   TONSILLECTOMY       Family History  Problem Relation Age of Onset   Diabetes Mother    Alzheimer's disease Mother    CVA Father    Heart failure Father    Epilepsy Sister    Diabetes Brother     Diabetes Sister      Social History   Socioeconomic History   Marital status: Legally Separated    Spouse name: Not on file   Number of children: 2   Years of education: GED   Highest education level: Not on file  Occupational History   Not on file  Tobacco Use   Smoking status: Never   Smokeless tobacco: Never  Vaping Use   Vaping Use: Never used  Substance and Sexual Activity   Alcohol use: Not Currently    Comment: occasionally   Drug use: No   Sexual activity: Yes  Other Topics Concern   Not on file  Social History Narrative   Not on file   Social Determinants of Health   Financial Resource Strain: Low Risk  (03/25/2021)   Overall Financial Resource Strain (CARDIA)    Difficulty of Paying Living Expenses: Not very hard  Food Insecurity: No Food Insecurity (03/25/2021)   Hunger Vital Sign    Worried About Running Out of Food in the Last Year: Never true    Ran Out of Food in the Last Year: Never true  Transportation Needs: No Transportation Needs (03/25/2021)   PRAPARE - Hydrologist (Medical): No    Lack of Transportation (Non-Medical): No  Physical Activity: Not on file  Stress: Not on file  Social Connections: Not on file  Intimate Partner Violence: Not on file     BP 132/70   Pulse (!) 160   Ht 5\' 10"  (1.778 m)   Wt (!) 354 lb (160.6 kg)   SpO2 95%   BMI 50.79 kg/m   Physical Exam:  Morbidly obese appearing NAD HEENT: Unremarkable Neck:  6 cm JVD, no thyromegally Lymphatics:  No adenopathy Back:  No CVA tenderness Lungs:  Clear with minimal basilar rales HEART:  Regular rate rhythm, no murmurs, no rubs, no clicks Abd:  soft, positive bowel sounds, no organomegally, no rebound, no guarding Ext:  2 plus pulses, no edema, no cyanosis, no clubbing Skin:  No rashes no nodules Neuro:  CN II through XII intact, motor grossly intact  EKG - NSR with lbbb  DEVICE  Normal device function.  See PaceArt for details.    Assess/Plan: Chronic systolic heart failure - I have discussed the treatment options with the patient. He has an indication for biv ICD upgrade. Real question is whether is left subclavian vein is open. I have recommended he undergo venography and if his vein open, proceed with upgrade and if  vein is not open, we can discuss extraction/biv upgrade with its increased risk.  Morbid obesity - he has been placed on mounjaro and I think weight loss would really help him.  HTN - his bp is controlled. No change in his meds. Dyslipdemia - should improve with weight loss.   Jacob Gowda Rasheem Figiel,MD

## 2021-12-10 NOTE — Patient Instructions (Addendum)
Medication Instructions:  Your physician recommends that you continue on your current medications as directed. Please refer to the Current Medication list given to you today.  *If you need a refill on your cardiac medications before your next appointment, please call your pharmacy*  Lab Work: None ordered.  If you have labs (blood work) drawn today and your tests are completely normal, you will receive your results only by: MyChart Message (if you have MyChart) OR A paper copy in the mail If you have any lab test that is abnormal or we need to change your treatment, we will call you to review the results.  Testing/Procedures: Please schedule an appointment for a LT Upper Extremity Venogram, Per Dr. Lewayne Bunting.    Follow-Up: Dates provided for SJ BiV ICD upgrade, depending on results of LUE Venogram per Dr. Ladona Ridgel.   January dates for procedure:  4, 8, 15, 16, 22, 29, 31  Please select a date and call us at [phone], and we will schedule this upgrade procedure for you.       Venogram A venogram, or venography, is a procedure that uses an X-ray and contrast dye to examine how well the veins work and how blood flows through them, especially in your legs. Contrast dye helps the veins show up on X-rays. A venogram may be done: To identify clots within veins, such as deep vein thrombosis (DVT). To find out where a blood clot started that has traveled to a lung (pulmonary embolism). To evaluate abnormalities in the vein, such as swelling. To map out the veins that might be needed for another procedure, for example for bypass graft surgery. To look at vein problems present at birth (congenital). Tell a health care provider about: Any allergies you have, especially allergies to iodine or contrast. All medicines you are taking, including vitamins, herbs, eye drops, creams, and over-the-counter medicines. Any problems you or family members have had with anesthetic medicines. Any  surgeries you have had. Any medical conditions you have, especially kidney failure or other kidney problems. In some cases, the contrast dye can cause kidney failure, especially if you are taking certain diabetes medicines. Any bleeding problems you have or if you are taking blood-thinning medicines (anticoagulants), aspirin, or other medicines that affect blood clotting. Whether you are pregnant, may be pregnant, or are breastfeeding. What are the risks? Your health care provider will talk with you about risks. These may include: Infection at the IV insertion site. Bleeding or blood clots. Allergic reaction to medicines, contrast dye, or iodine. Damage to other structures or organs. Kidney problems. Increased risk of cancer from radiation exposure. This risk is low. What happens before the procedure? Medicines Ask your health care provider about: Changing or stopping your regular medicines. These include any diabetes medicines or blood thinners you take. Taking medicines such as aspirin and ibuprofen. These medicines can thin your blood. Do not take them unless your health care provider tells you to. Taking over-the-counter medicines, vitamins, herbs, and supplements. General instructions Follow instructions from your health care provider about what you may eat and drink. You may have blood tests to check how well your kidneys and liver are working and how well your blood can clot. If you will be going home right after the procedure, plan to have a responsible adult: Take you home from the hospital or clinic. You will not be allowed to drive. Care for you for the time you are told. What happens during the procedure?  You may  be given a sedative. This helps you relax. You will lie down on an X-ray table. The table may be tilted in different directions during the procedure. Safety straps will keep you secure if the table is tilted. Your healthcare provider will clean a designated area and  then put an intravenous (IV) line into a vein in your foot or hand. The contrast dye will be injected into your IV. You may have a hot, flushed feeling as it moves throughout your body. You may also have a metallic taste in your mouth. Both of these sensations will go away after a few minutes. Your healthcare provider may use a tourniquet on your leg to control how fast the blood flows. You may be asked to lie in different positions or place your legs or arms in different positions. At the end of the procedure, you may be given IV fluids to help wash (flush) the contrast dye out of your veins. The IV will be removed. A bandage (dressing) may be applied to the IV site. The procedure may vary among health care providers and hospitals. What happens after the procedure? Your blood pressure, heart rate, breathing rate, and blood oxygen level will be monitored until you leave the hospital or clinic. Pulses in your feet will also be checked, as well as the temperature, color, and sensation in your legs. It is up to you to get the results of your procedure. Ask your health care provider, or the department that is doing the procedure, when your results will be ready. This information is not intended to replace advice given to you by your health care provider. Make sure you discuss any questions you have with your health care provider. Document Revised: 05/10/2021 Document Reviewed: 05/10/2021 Elsevier Patient Education  [place].

## 2021-12-14 NOTE — Progress Notes (Signed)
Remote ICD transmission.   

## 2021-12-21 ENCOUNTER — Other Ambulatory Visit: Payer: Self-pay | Admitting: Internal Medicine

## 2021-12-21 ENCOUNTER — Ambulatory Visit (HOSPITAL_COMMUNITY)
Admission: RE | Admit: 2021-12-21 | Discharge: 2021-12-21 | Disposition: A | Discharge status: Home / Self Care | Payer: Medicare Other | Source: Ambulatory Visit | Attending: Internal Medicine | Admitting: Internal Medicine

## 2021-12-21 DIAGNOSIS — I5022 Chronic systolic (congestive) heart failure: Secondary | ICD-10-CM

## 2021-12-21 DIAGNOSIS — I82B12 Acute embolism and thrombosis of left subclavian vein: Secondary | ICD-10-CM | POA: Insufficient documentation

## 2021-12-21 HISTORY — PX: IR US GUIDE VASC ACCESS LEFT: IMG2389

## 2021-12-21 HISTORY — PX: IR VENO/EXT/UNI LEFT: IMG675

## 2021-12-21 MED ORDER — IOHEXOL 300 MG/ML  SOLN
100.0000 mL | Freq: Once | INTRAMUSCULAR | Status: AC | PRN
  Administered 2021-12-21: 10 mL via INTRA_ARTERIAL

## 2021-12-21 MED ORDER — LIDOCAINE-EPINEPHRINE 1 %-1:100000 IJ SOLN
INTRAMUSCULAR | Status: AC
  Administered 2021-12-21: 10 mL
  Filled 2021-12-21: qty 1

## 2022-01-11 ENCOUNTER — Telehealth: Payer: Self-pay | Admitting: Internal Medicine

## 2022-01-11 NOTE — Telephone Encounter (Signed)
Pt called [facility] T[facility] stating he wanted to cancel the BiV ICD upgrade procedure as discussed with Dr. Lovena Le on 12/10/21.   On 12/10/21, Dr. Lovena Le first ordered LT Upper Extremity Venogram piror to scheduling the [place] ICD upgrade.    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  Multiple cervical and thoracic collateral veins are present.   Dr. Lovena Le ok'd Pt request of NOT upgrading his current device, per these test results;    Pt made aware of this during the telephone call, and that Dr. Lovena Le agreed not to pursue upgrading the device.    Pt wanted to know when next appointment should be?  I will reach out to Dr. Lovena Le, and follow up with patient.    Pt also advised to contact [facility] with any new or worsening symptoms.

## 2022-01-11 NOTE — Telephone Encounter (Signed)
Patient is requesting to speak with Dr. Tanna Furry nurse regarding cancelling a procedure for an an additional ICD implant.

## 2022-01-19 NOTE — Telephone Encounter (Signed)
Pt called per Dr. Forde Dandy message for one year follow up with patient.    Pt understood 1 year follow up appointment, and recall will be entered.  Pt advised to contact [facility] for new or worsening symptoms.  Pt understood, and no follow up is required at this time.

## 2022-01-19 NOTE — Telephone Encounter (Signed)
One year

## 2022-01-25 ENCOUNTER — Other Ambulatory Visit: Payer: Self-pay

## 2022-01-25 NOTE — Patient Outreach (Signed)
Aging Gracefully Program  01/25/2022  [name] May 27, 1955 [id number]   [facility] Evaluation Interviewer made contact with patient. Aging Gracefully 9 month survey completed.    [place] Management Assistant [phone]

## 2022-02-02 DIAGNOSIS — Z95810 Presence of automatic (implantable) cardiac defibrillator: Secondary | ICD-10-CM | POA: Diagnosis not present

## 2022-02-02 DIAGNOSIS — E1142 Type 2 diabetes mellitus with diabetic polyneuropathy: Secondary | ICD-10-CM | POA: Diagnosis not present

## 2022-02-02 DIAGNOSIS — I4891 Unspecified atrial fibrillation: Secondary | ICD-10-CM | POA: Diagnosis not present

## 2022-02-02 DIAGNOSIS — E1165 Type 2 diabetes mellitus with hyperglycemia: Secondary | ICD-10-CM | POA: Diagnosis not present

## 2022-02-02 DIAGNOSIS — I77810 Thoracic aortic ectasia: Secondary | ICD-10-CM | POA: Diagnosis not present

## 2022-02-02 DIAGNOSIS — D860 Sarcoidosis of lung: Secondary | ICD-10-CM | POA: Diagnosis not present

## 2022-02-02 DIAGNOSIS — D6869 Other thrombophilia: Secondary | ICD-10-CM | POA: Diagnosis not present

## 2022-02-02 DIAGNOSIS — I5022 Chronic systolic (congestive) heart failure: Secondary | ICD-10-CM | POA: Diagnosis not present

## 2022-02-25 ENCOUNTER — Ambulatory Visit (INDEPENDENT_AMBULATORY_CARE_PROVIDER_SITE_OTHER): Payer: 59

## 2022-02-25 DIAGNOSIS — I428 Other cardiomyopathies: Secondary | ICD-10-CM | POA: Diagnosis not present

## 2022-02-25 LAB — CUP PACEART REMOTE DEVICE CHECK
Battery Remaining Longevity: 47 mo
Battery Remaining Percentage: 50 %
Battery Voltage: 2.93 V
Brady Statistic AP VP Percent: 11 %
Brady Statistic AP VS Percent: 1 %
Brady Statistic AS VP Percent: 85 %
Brady Statistic AS VS Percent: 1.5 %
Brady Statistic RA Percent Paced: 10 %
Brady Statistic RV Percent Paced: 97 %
Date Time Interrogation Session: 20240216020020
HighPow Impedance: 44 Ohm
HighPow Impedance: 45 Ohm
Implantable Lead Connection Status: 753985
Implantable Lead Connection Status: 753985
Implantable Lead Implant Date: 19961213
Implantable Lead Implant Date: 20111202
Implantable Lead Location: 753859
Implantable Lead Location: 753860
Implantable Lead Model: 5524
Implantable Lead Model: 7121
Implantable Pulse Generator Implant Date: 20200518
Lead Channel Impedance Value: 330 Ohm
Lead Channel Impedance Value: 480 Ohm
Lead Channel Pacing Threshold Amplitude: 0.75 V
Lead Channel Pacing Threshold Amplitude: 1 V
Lead Channel Pacing Threshold Pulse Width: 0.5 ms
Lead Channel Pacing Threshold Pulse Width: 0.5 ms
Lead Channel Sensing Intrinsic Amplitude: 1.6 mV
Lead Channel Sensing Intrinsic Amplitude: 11.2 mV
Lead Channel Setting Pacing Amplitude: 2 V
Lead Channel Setting Pacing Amplitude: 2 V
Lead Channel Setting Pacing Pulse Width: 0.5 ms
Lead Channel Setting Sensing Sensitivity: 0.5 mV
Pulse Gen Serial Number: 9829382

## 2022-03-01 DIAGNOSIS — E782 Mixed hyperlipidemia: Secondary | ICD-10-CM | POA: Diagnosis not present

## 2022-03-01 DIAGNOSIS — I10 Essential (primary) hypertension: Secondary | ICD-10-CM | POA: Diagnosis not present

## 2022-03-01 DIAGNOSIS — I5022 Chronic systolic (congestive) heart failure: Secondary | ICD-10-CM | POA: Diagnosis not present

## 2022-03-01 DIAGNOSIS — I4891 Unspecified atrial fibrillation: Secondary | ICD-10-CM | POA: Diagnosis not present

## 2022-03-01 DIAGNOSIS — J45909 Unspecified asthma, uncomplicated: Secondary | ICD-10-CM | POA: Diagnosis not present

## 2022-03-01 DIAGNOSIS — E1142 Type 2 diabetes mellitus with diabetic polyneuropathy: Secondary | ICD-10-CM | POA: Diagnosis not present

## 2022-03-01 DIAGNOSIS — H409 Unspecified glaucoma: Secondary | ICD-10-CM | POA: Diagnosis not present

## 2022-03-22 NOTE — Progress Notes (Signed)
Remote ICD transmission.   

## 2022-05-02 DIAGNOSIS — E1142 Type 2 diabetes mellitus with diabetic polyneuropathy: Secondary | ICD-10-CM | POA: Diagnosis not present

## 2022-05-02 DIAGNOSIS — I700 Atherosclerosis of aorta: Secondary | ICD-10-CM | POA: Diagnosis not present

## 2022-05-02 DIAGNOSIS — D6869 Other thrombophilia: Secondary | ICD-10-CM | POA: Diagnosis not present

## 2022-05-02 DIAGNOSIS — Z95810 Presence of automatic (implantable) cardiac defibrillator: Secondary | ICD-10-CM | POA: Diagnosis not present

## 2022-05-02 DIAGNOSIS — Z794 Long term (current) use of insulin: Secondary | ICD-10-CM | POA: Diagnosis not present

## 2022-05-02 DIAGNOSIS — I77810 Thoracic aortic ectasia: Secondary | ICD-10-CM | POA: Diagnosis not present

## 2022-05-02 DIAGNOSIS — G4733 Obstructive sleep apnea (adult) (pediatric): Secondary | ICD-10-CM | POA: Diagnosis not present

## 2022-05-02 DIAGNOSIS — I5022 Chronic systolic (congestive) heart failure: Secondary | ICD-10-CM | POA: Diagnosis not present

## 2022-05-02 DIAGNOSIS — I4891 Unspecified atrial fibrillation: Secondary | ICD-10-CM | POA: Diagnosis not present

## 2022-05-02 DIAGNOSIS — D860 Sarcoidosis of lung: Secondary | ICD-10-CM | POA: Diagnosis not present

## 2022-05-02 DIAGNOSIS — I110 Hypertensive heart disease with heart failure: Secondary | ICD-10-CM | POA: Diagnosis not present

## 2022-05-10 DIAGNOSIS — Z961 Presence of intraocular lens: Secondary | ICD-10-CM | POA: Diagnosis not present

## 2022-05-10 DIAGNOSIS — H26492 Other secondary cataract, left eye: Secondary | ICD-10-CM | POA: Diagnosis not present

## 2022-05-10 DIAGNOSIS — H35033 Hypertensive retinopathy, bilateral: Secondary | ICD-10-CM | POA: Diagnosis not present

## 2022-05-10 DIAGNOSIS — H40013 Open angle with borderline findings, low risk, bilateral: Secondary | ICD-10-CM | POA: Diagnosis not present

## 2022-05-10 DIAGNOSIS — E119 Type 2 diabetes mellitus without complications: Secondary | ICD-10-CM | POA: Diagnosis not present

## 2022-05-27 ENCOUNTER — Ambulatory Visit (INDEPENDENT_AMBULATORY_CARE_PROVIDER_SITE_OTHER): Payer: 59

## 2022-05-27 DIAGNOSIS — I428 Other cardiomyopathies: Secondary | ICD-10-CM

## 2022-05-27 LAB — CUP PACEART REMOTE DEVICE CHECK
Battery Remaining Longevity: 44 mo
Battery Remaining Percentage: 48 %
Battery Voltage: 2.93 V
Brady Statistic AP VP Percent: 14 %
Brady Statistic AP VS Percent: 1 %
Brady Statistic AS VP Percent: 83 %
Brady Statistic AS VS Percent: 1.5 %
Brady Statistic RA Percent Paced: 13 %
Brady Statistic RV Percent Paced: 97 %
Date Time Interrogation Session: 20240517025137
HighPow Impedance: 45 Ohm
HighPow Impedance: 46 Ohm
Implantable Lead Connection Status: 753985
Implantable Lead Connection Status: 753985
Implantable Lead Implant Date: 19961213
Implantable Lead Implant Date: 20111202
Implantable Lead Location: 753859
Implantable Lead Location: 753860
Implantable Lead Model: 5524
Implantable Lead Model: 7121
Implantable Pulse Generator Implant Date: 20200518
Lead Channel Impedance Value: 360 Ohm
Lead Channel Impedance Value: 490 Ohm
Lead Channel Pacing Threshold Amplitude: 0.75 V
Lead Channel Pacing Threshold Amplitude: 1 V
Lead Channel Pacing Threshold Pulse Width: 0.5 ms
Lead Channel Pacing Threshold Pulse Width: 0.5 ms
Lead Channel Sensing Intrinsic Amplitude: 1.7 mV
Lead Channel Sensing Intrinsic Amplitude: 12 mV
Lead Channel Setting Pacing Amplitude: 2 V
Lead Channel Setting Pacing Amplitude: 2 V
Lead Channel Setting Pacing Pulse Width: 0.5 ms
Lead Channel Setting Sensing Sensitivity: 0.5 mV
Pulse Gen Serial Number: 9829382

## 2022-06-08 NOTE — Progress Notes (Signed)
Remote ICD transmission.   

## 2022-06-25 ENCOUNTER — Other Ambulatory Visit: Payer: Self-pay | Admitting: Internal Medicine

## 2022-06-25 DIAGNOSIS — I5022 Chronic systolic (congestive) heart failure: Secondary | ICD-10-CM

## 2022-07-07 DIAGNOSIS — E782 Mixed hyperlipidemia: Secondary | ICD-10-CM | POA: Diagnosis not present

## 2022-07-07 DIAGNOSIS — E1142 Type 2 diabetes mellitus with diabetic polyneuropathy: Secondary | ICD-10-CM | POA: Diagnosis not present

## 2022-07-07 DIAGNOSIS — I5022 Chronic systolic (congestive) heart failure: Secondary | ICD-10-CM | POA: Diagnosis not present

## 2022-07-07 DIAGNOSIS — H409 Unspecified glaucoma: Secondary | ICD-10-CM | POA: Diagnosis not present

## 2022-07-07 DIAGNOSIS — I10 Essential (primary) hypertension: Secondary | ICD-10-CM | POA: Diagnosis not present

## 2022-07-07 DIAGNOSIS — I4891 Unspecified atrial fibrillation: Secondary | ICD-10-CM | POA: Diagnosis not present

## 2022-07-15 ENCOUNTER — Ambulatory Visit (HOSPITAL_COMMUNITY)
Admission: RE | Admit: 2022-07-15 | Discharge: 2022-07-15 | Disposition: A | Discharge status: Home / Self Care | Payer: 59 | Source: Ambulatory Visit | Attending: Family Medicine | Admitting: Family Medicine

## 2022-07-15 ENCOUNTER — Encounter (HOSPITAL_COMMUNITY): Payer: Self-pay

## 2022-07-15 VITALS — BP 110/70 | HR 89 | Wt 343.2 lb

## 2022-07-15 DIAGNOSIS — I480 Paroxysmal atrial fibrillation: Secondary | ICD-10-CM | POA: Diagnosis not present

## 2022-07-15 DIAGNOSIS — I428 Other cardiomyopathies: Secondary | ICD-10-CM | POA: Diagnosis not present

## 2022-07-15 DIAGNOSIS — I110 Hypertensive heart disease with heart failure: Secondary | ICD-10-CM | POA: Diagnosis not present

## 2022-07-15 DIAGNOSIS — E669 Obesity, unspecified: Secondary | ICD-10-CM | POA: Diagnosis not present

## 2022-07-15 DIAGNOSIS — G4733 Obstructive sleep apnea (adult) (pediatric): Secondary | ICD-10-CM

## 2022-07-15 DIAGNOSIS — I471 Supraventricular tachycardia, unspecified: Secondary | ICD-10-CM | POA: Diagnosis not present

## 2022-07-15 DIAGNOSIS — I5022 Chronic systolic (congestive) heart failure: Secondary | ICD-10-CM | POA: Diagnosis not present

## 2022-07-15 DIAGNOSIS — Z95810 Presence of automatic (implantable) cardiac defibrillator: Secondary | ICD-10-CM | POA: Insufficient documentation

## 2022-07-15 DIAGNOSIS — Z7901 Long term (current) use of anticoagulants: Secondary | ICD-10-CM | POA: Insufficient documentation

## 2022-07-15 DIAGNOSIS — Z7984 Long term (current) use of oral hypoglycemic drugs: Secondary | ICD-10-CM | POA: Diagnosis not present

## 2022-07-15 DIAGNOSIS — I442 Atrioventricular block, complete: Secondary | ICD-10-CM

## 2022-07-15 DIAGNOSIS — D860 Sarcoidosis of lung: Secondary | ICD-10-CM | POA: Insufficient documentation

## 2022-07-15 DIAGNOSIS — Z79899 Other long term (current) drug therapy: Secondary | ICD-10-CM | POA: Diagnosis not present

## 2022-07-15 DIAGNOSIS — I10 Essential (primary) hypertension: Secondary | ICD-10-CM | POA: Diagnosis not present

## 2022-07-15 DIAGNOSIS — K625 Hemorrhage of anus and rectum: Secondary | ICD-10-CM | POA: Insufficient documentation

## 2022-07-15 DIAGNOSIS — E119 Type 2 diabetes mellitus without complications: Secondary | ICD-10-CM | POA: Insufficient documentation

## 2022-07-15 LAB — CBC
HCT: 40.3 % (ref 39.0–52.0)
Hemoglobin: 12.8 g/dL — ABNORMAL LOW (ref 13.0–17.0)
MCH: 27 pg (ref 26.0–34.0)
MCHC: 31.8 g/dL (ref 30.0–36.0)
MCV: 85 fL (ref 80.0–100.0)
Platelets: 257 10*3/uL (ref 150–400)
RBC: 4.74 MIL/uL (ref 4.22–5.81)
RDW: 13.9 % (ref 11.5–15.5)
WBC: 7.4 10*3/uL (ref 4.0–10.5)
nRBC: 0 % (ref 0.0–0.2)

## 2022-07-15 LAB — BASIC METABOLIC PANEL
Anion gap: 7 (ref 5–15)
BUN: 8 mg/dL (ref 8–23)
CO2: 20 mmol/L — ABNORMAL LOW (ref 22–32)
Calcium: 10.3 mg/dL (ref 8.9–10.3)
Chloride: 107 mmol/L (ref 98–111)
Creatinine, Ser: 1 mg/dL (ref 0.61–1.24)
GFR, Estimated: >60 mL/min (ref >60)
Glucose, Bld: 153 mg/dL — ABNORMAL HIGH (ref 70–99)
Potassium: 4 mmol/L (ref 3.5–5.1)
Sodium: 134 mmol/L — ABNORMAL LOW (ref 135–145)

## 2022-07-15 LAB — IRON AND TIBC
Iron: 37 ug/dL — ABNORMAL LOW (ref 45–182)
Saturation Ratios: 8 % — ABNORMAL LOW (ref 17.9–39.5)
TIBC: 447 ug/dL (ref 250–450)
UIBC: 410 ug/dL

## 2022-07-15 LAB — BRAIN NATRIURETIC PEPTIDE: B Natriuretic Peptide: 94.8 pg/mL (ref 0.0–100.0)

## 2022-07-15 LAB — FERRITIN: Ferritin: 6 ng/mL — ABNORMAL LOW (ref 24–336)

## 2022-07-15 MED ORDER — SODIUM CHLORIDE 0.9 % IV SOLN: 510.0000 mg | Freq: Once | INTRAVENOUS | Status: DC

## 2022-07-15 NOTE — Addendum Note (Signed)
Encounter addended by: Jacklynn Ganong, FNP on: 07/15/2022 4:28 PM  Actions taken: Alternative orders not taken and original order placed, Order list changed

## 2022-07-15 NOTE — Progress Notes (Signed)
[facility] Note  Primary Care: Georgann Housekeeper, MD Primary Cardiologist: [name], MD HF Cardiologist: Dr. Gala Romney  HPI: [name] is a 67 y.o. male with a obesity, systolic HF due to NICM (normal coronary arteries by cath 1996), pulmonary sarcoidosis (biopsy proven), DM2, HTN,  OSA (intolerant of CPAP), CHB s/p dual chamber AICD and SVT s/p ablation.  Referred by Dr. [name] for further evaluation of his HF.   Diagnosed with pulmonary sarcoid in 1992 or 1993 (biopsy proven). Treated with steroids for several months. Told it was burned out.  Had SVT ablation c/b CHB and underwent PPM placement   Cath in 1996. Normal coronary arteries.    Myoview 2011 EF 28% large inferior scar Echo 2013 EF 35-40%  Echo 2015 15-20%  Myoview 2017. EF 20% large scar Echo 2019 EF 15-20% Echo 1/23 EF 15-20%   St. Jude BiV ICD implanted 1996, upgrade 2011, gen change 2020 for CHF (LV port capped). He has previously been offered CRT upgrade and has declined  Follow up 3/23. Arranged L/RHC and sleep study. R/LHC (4/23) showed normal cors, EF 20%, well compensated hemodynamics except for reduced PAPi.  Follow up 9/23, RV pacing 98%, referred to EP. Follow up 12/23 with EP to discuss CRT-D upgrade, unfortunately found to have occluded L SCV vein and unable to undergo BiV upgrade.  Today he returns for HF follow up. Overall feeling fine. He has SOB walking up steps. Working out a home gym 4x/week, walks outside 2x/week. Has some ankle swelling.  Denies palpitations, abnormal bleeding, CP, dizziness, or PND/Orthopnea. Appetite ok. No fever or chills. Weight at home 335-340 pounds. Taking all medications. Takes extra Lasix 2x/week. He has BRBpR occurring 3x/week after having BM, has PCP follow up to address. BP at home 115/73. Eats a lot of salt and drinks 2L fluid/day. Occasional ETOH, no tobacco use.    Cardiac Studies: - Sleep study (6/23): revealed moderate sleep apnea.    - R/LHC (4/23): normal cors, Relatively well compensated hemodynamics except for reduced PAPi   The left ventricular ejection fraction is less than 25% by visual estimate. Ao = 107/62 (80) LV = 103/17 RA = 12 RV = 36/12 PA = 35/17 (24) PCW = 13 Fick cardiac output/index = 7.9/3.0 PVR = 1.4 WU SVR = 665  FA sat = 98% PA sat = 71%, 74% PAPi = 1.5  - Echo 1/23 EF 15-20%  - Echo 2019 EF 15-20% - Myoview 2017. EF 20% large scar - Echo 2015 15-20%  - Echo 2013 EF 35-40%  - Myoview 2011 EF 28% large inferior scar  Past Medical History:  Diagnosis Date   Asthma    Automatic implantable cardioverter-defibrillator in situ    Back pain    Chronic systolic CHF (congestive heart failure), NYHA class 3 (HCC)    Complete heart block (HCC)    s/p PPM 1997 with upgrade to AICD 2011   Depression    Dilated aortic root (HCC)    40mm ascending aortic root   DM (diabetes mellitus) (HCC)    Glaucoma    HTN (hypertension)    Hyperlipidemia    Nonischemic cardiomyopathy (HCC)    EF of 15-20% by echo 2015 and 20% by nuclear stress test 2017   Obesity    PAF (paroxysmal atrial fibrillation) (HCC)    13 minutes of PAF documented on ICD check 2018 - anticoagulated with Eliquis   Sarcoid    Sleep apnea 06-28-12   no  cpap used-unable to tolerate   SVT (supraventricular tachycardia)    s/p ablation   Current Outpatient Medications  Medication Sig Dispense Refill   Aspirin-Caffeine (BC FAST PAIN RELIEF PO) Take 1 packet by mouth daily as needed (pain).     furosemide (LASIX) 40 MG tablet Take 1 tablet (40 mg total) by mouth daily. NEEDS FOLLOW UP APPOINTMENT FOR MORE REFILLS 90 tablet 0   JARDIANCE 25 MG TABS tablet Take 25 mg by mouth daily.     KLOR-CON M20 20 MEQ tablet TAKE 1 TABLET (20 MEQ TOTAL) BY MOUTH AS NEEDED. WITH LASIX DOSE ONLY 90 tablet 2   MAGNESIUM PO Take 200 mg by mouth every other day.     metoprolol succinate (TOPROL-XL) 50 MG 24 hr tablet TAKE 1 TABLET BY MOUTH DAILY  WITH OR IMMEDIATELY FOLLOWING A MEAL 90 tablet 3   MOUNJARO 2.5 MG/0.5ML Pen Inject 12.5 mg into the skin every Friday.     RESTASIS 0.05 % ophthalmic emulsion Place 1 drop into both eyes 2 (two) times daily as needed (dry eyes).     rivaroxaban (XARELTO) 20 MG TABS tablet Take 1 tablet (20 mg total) by mouth daily with supper. 30 tablet 11   rosuvastatin (CRESTOR) 20 MG tablet Take 20 mg by mouth daily.     sacubitril-valsartan (ENTRESTO) 97-103 MG Take 1 tablet by mouth 2 (two) times daily. NEEDS FOLLOW UP APPOINTMENT FOR MORE REFILLS 180 tablet 0   spironolactone (ALDACTONE) 25 MG tablet TAKE 1 TABLET (25 MG TOTAL) BY MOUTH DAILY. 90 tablet 3   tiZANidine (ZANAFLEX) 4 MG capsule Take 4 mg by mouth at bedtime as needed for muscle spasms.     TRESIBA FLEXTOUCH 200 UNIT/ML SOPN Inject 40 Units into the skin every morning.     zinc gluconate 50 MG tablet Take 50 mg by mouth every other day.     No current facility-administered medications for this encounter.   Facility-Administered Medications Ordered in Other Encounters  Medication Dose Route Frequency Provider Last Rate Last Admin   technetium pyrophosphate Tc 28m injection 21.1 millicurie  21.1 millicurie Intravenous Once Lars Masson, MD       Allergies  Allergen Reactions   Penicillins Other (See Comments)    Dizziness Did it involve swelling of the face/tongue/throat, SOB, or low BP? Yes Did it involve sudden or severe rash/hives, skin peeling, or any reaction on the inside of your mouth or nose? No Did you need to seek medical attention at a hospital or doctor's office? Yes When did it last happen?      childhood If all above answers are "NO", may proceed with cephalosporin use.    Social History   Socioeconomic History   Marital status: Legally Separated    Spouse name: Not on file   Number of children: 2   Years of education: GED   Highest education level: Not on file  Occupational History   Not on file  Tobacco  Use   Smoking status: Never   Smokeless tobacco: Never  Vaping Use   Vaping Use: Never used  Substance and Sexual Activity   Alcohol use: Not Currently    Comment: occasionally   Drug use: No   Sexual activity: Yes  Other Topics Concern   Not on file  Social History Narrative   Not on file   Social Determinants of Health   Financial Resource Strain: Low Risk  (03/25/2021)   Overall Financial Resource Strain (CARDIA)  Difficulty of Paying Living Expenses: Not very hard  Food Insecurity: No Food Insecurity (03/25/2021)   Hunger Vital Sign    Worried About Running Out of Food in the Last Year: Never true    Ran Out of Food in the Last Year: Never true  Transportation Needs: No Transportation Needs (03/25/2021)   PRAPARE - Administrator, Civil Service (Medical): No    Lack of Transportation (Non-Medical): No  Physical Activity: Not on file  Stress: Not on file  Social Connections: Not on file  Intimate Partner Violence: Not on file   Family History  Problem Relation Age of Onset   Diabetes Mother    Alzheimer's disease Mother    CVA Father    Heart failure Father    Epilepsy Sister    Diabetes Brother    Diabetes Sister    Wt Readings from Last 3 Encounters:  07/15/22 (!) 155.7 kg (343 lb 3.2 oz)  12/10/21 (!) 160.6 kg (354 lb)  10/08/21 (!) 161.9 kg (357 lb)   BP 110/70   Pulse 89   Wt (!) 155.7 kg (343 lb 3.2 oz)   SpO2 95%   BMI 49.24 kg/m   PHYSICAL EXAM: General:  NAD. No resp difficulty, walked into clinic HEENT: Normal Neck: Supple. Thick neck, JVP difficult. Carotids 2+ bilat; no bruits. No lymphadenopathy or thryomegaly appreciated. Cor: PMI nondisplaced. Regular rate & rhythm. No rubs, gallops or murmurs. Lungs: Clear Abdomen: Soft, obese, nontender, nondistended. No hepatosplenomegaly. No bruits or masses. Good bowel sounds. Extremities: No cyanosis, clubbing, rash, edema Neuro: Alert & oriented x 3, cranial nerves grossly intact. Moves  all 4 extremities w/o difficulty. Affect pleasant.  ICD interrogation (personally reviewed): Recent volume overload now CorVue suggests volume lower, 97% RVP, no AF or VT. No VT/VF. 2-4 hr/day activity  REDs: 35%  ASSESSMENT & PLAN: 1.  Chronic systolic CHF  - longstanding LV dysfunction. Etiology remains unclear - Last cath 1996 with normal coronaries however subsequent Myoview have suggested possible underlying iHD - which I would suspect given his CRFs. Also has chronic RV pacing - s/p SJ BiVICD (LV lead capped) - has refused upgrade to CRT - Echo (1/23): EF 20% RV ok  - R/LHC (4/23) normal cors, EF 20% NICM, well compensated hemodynamics, except low PAPi; RA 12, PA 35/17 (24), PCW 13, CO/CI (Fick) 7.9/3.0, PAPi 1.5 - Unable to get cMRI with ICD - Doubt cardiac sarcoid, with wide QRS, suspect RV pacing CM (see below) - Improved NYHA II. Volume OK by device interrogation, ReDs 35% - Continue Entresto 97-103 mg bid. - Continue Toprol XL 50 mg daily. - Continue spiro 25 mg daily. - Continue Jardiance 25 mg daily. - Continue Lasix 40 mg daily. OK to take extra 40 mg PRN w/ 20 KCL PRN. - PYP scan 4/20 equivocal for amyloid. Urine and protein electrophoresis were normal. - Labs today. - Repeat echo next visit.   2. HTN  - BP well controlled.  - Continue current regimen  3. CHB   - Resulted from SVT ablation - he has STJ BiV ICD in place but LV lead capped. Has refused CRT upgrade in the past.  - Saw Dr Ladona Ridgel regarding CRT-D, but L SCV occluded. Extraction and BiV upgrade increased risk   4. PAF  - he was noted on ICD check in 2018 to have PAF. - Regular on exam today, no AF on device interrogation. - Continue Xarelto. Mild occasional rectal bleeding with BMs. - CBC  and iron panel today.   5. OSA - moderate by sleep study 6/23  - Unable to tolerate CPAP - Follows with Dr. [name].  Follow up in 3 months with Dr. Gala Romney + echo  [place], FNP-BC 07/15/22

## 2022-07-15 NOTE — Progress Notes (Signed)
ReDS Vest / Clip - 07/15/22 1100       ReDS Vest / Clip   Station Marker D    Ruler Value 45    ReDS Value Range Low volume    ReDS Actual Value 35

## 2022-07-15 NOTE — Patient Instructions (Addendum)
Thank you for coming in today  If you had labs drawn today, any labs that are abnormal the clinic will call you No news is good news  Medications: No changes    Follow up appointments:  Your physician recommends that you schedule a follow-up appointment in:  3 months echocardiogram With Dr. Gala Romney You will receive a reminder letter in the mail a few months in advance. If you don't receive a letter, please call our office to schedule the follow-up appointment.  Your physician has requested that you have an echocardiogram. Echocardiography is a painless test that uses sound waves to create images of your heart. It provides your doctor with information about the size and shape of your heart and how well your heart's chambers and valves are working. This procedure takes approximately one hour. There are no restrictions for this procedure.      Do the following things EVERYDAY: Weigh yourself in the morning before breakfast. Write it down and keep it in a log. Take your medicines as prescribed Eat low salt foods--Limit salt (sodium) to 2000 mg per day.  Stay as active as you can everyday Limit all fluids for the day to less than 2 liters   At the [facility], you and your health needs are our priority. As part of our continuing mission to provide you with exceptional heart care, we have created designated Provider Care Teams. These Care Teams include your primary Cardiologist (physician) and Advanced Practice Providers (APPs- Physician Assistants and Nurse Practitioners) who all work together to provide you with the care you need, when you need it.   You may see any of the following providers on your designated Care Team at your next follow up: Dr Arvilla Meres Dr Marca Ancona Dr. Marcos Eke, NP Robbie Lis, [place] [facility] [place], [place] Brynda Peon, NP Karle Plumber, PharmD   Please be sure to bring in all your medications  bottles to every appointment.    Thank you for choosing [facility]  If you have any questions or concerns before your next appointment please send [place] a message through [place] or call our office at [phone].    TO LEAVE A MESSAGE FOR THE NURSE SELECT OPTION 2, PLEASE LEAVE A MESSAGE INCLUDING: YOUR NAME DATE OF BIRTH CALL BACK NUMBER REASON FOR CALL**this is important as we prioritize the call backs  YOU WILL RECEIVE A CALL BACK THE SAME DAY AS LONG AS YOU CALL BEFORE 4:00 PM

## 2022-07-18 ENCOUNTER — Other Ambulatory Visit: Payer: Self-pay | Admitting: Internal Medicine

## 2022-07-18 DIAGNOSIS — K625 Hemorrhage of anus and rectum: Secondary | ICD-10-CM | POA: Diagnosis not present

## 2022-07-25 ENCOUNTER — Encounter: Payer: Self-pay | Admitting: Family Medicine

## 2022-08-01 ENCOUNTER — Ambulatory Visit (HOSPITAL_COMMUNITY)
Admission: RE | Admit: 2022-08-01 | Discharge: 2022-08-01 | Disposition: A | Discharge status: Home / Self Care | Payer: 59 | Source: Ambulatory Visit | Attending: Internal Medicine | Admitting: Internal Medicine

## 2022-08-01 DIAGNOSIS — G4733 Obstructive sleep apnea (adult) (pediatric): Secondary | ICD-10-CM | POA: Diagnosis not present

## 2022-08-01 DIAGNOSIS — I480 Paroxysmal atrial fibrillation: Secondary | ICD-10-CM | POA: Diagnosis not present

## 2022-08-01 DIAGNOSIS — I5022 Chronic systolic (congestive) heart failure: Secondary | ICD-10-CM | POA: Insufficient documentation

## 2022-08-01 DIAGNOSIS — I110 Hypertensive heart disease with heart failure: Secondary | ICD-10-CM | POA: Diagnosis not present

## 2022-08-01 DIAGNOSIS — I442 Atrioventricular block, complete: Secondary | ICD-10-CM | POA: Insufficient documentation

## 2022-08-01 MED ORDER — SODIUM CHLORIDE 0.9 % IV SOLN
510.0000 mg | Freq: Once | INTRAVENOUS | Status: AC
  Administered 2022-08-01: 510 mg via INTRAVENOUS
  Filled 2022-08-01: qty 510

## 2022-08-26 ENCOUNTER — Ambulatory Visit (INDEPENDENT_AMBULATORY_CARE_PROVIDER_SITE_OTHER): Payer: 59

## 2022-08-26 DIAGNOSIS — I442 Atrioventricular block, complete: Secondary | ICD-10-CM | POA: Diagnosis not present

## 2022-08-26 LAB — CUP PACEART REMOTE DEVICE CHECK
Battery Remaining Longevity: 42 mo
Battery Remaining Percentage: 45 %
Battery Voltage: 2.92 V
Brady Statistic AP VP Percent: 14 %
Brady Statistic AP VS Percent: 1 %
Brady Statistic AS VP Percent: 84 %
Brady Statistic AS VS Percent: 1.1 %
Brady Statistic RA Percent Paced: 13 %
Brady Statistic RV Percent Paced: 98 %
Date Time Interrogation Session: 20240816020017
HighPow Impedance: 45 Ohm
HighPow Impedance: 45 Ohm
Implantable Lead Connection Status: 753985
Implantable Lead Connection Status: 753985
Implantable Lead Implant Date: 19961213
Implantable Lead Implant Date: 20111202
Implantable Lead Location: 753859
Implantable Lead Location: 753860
Implantable Lead Model: 5524
Implantable Lead Model: 7121
Implantable Pulse Generator Implant Date: 20200518
Lead Channel Impedance Value: 340 Ohm
Lead Channel Impedance Value: 510 Ohm
Lead Channel Pacing Threshold Amplitude: 0.75 V
Lead Channel Pacing Threshold Amplitude: 1 V
Lead Channel Pacing Threshold Pulse Width: 0.5 ms
Lead Channel Pacing Threshold Pulse Width: 0.5 ms
Lead Channel Sensing Intrinsic Amplitude: 11.8 mV
Lead Channel Sensing Intrinsic Amplitude: 2.3 mV
Lead Channel Setting Pacing Amplitude: 2 V
Lead Channel Setting Pacing Amplitude: 2 V
Lead Channel Setting Pacing Pulse Width: 0.5 ms
Lead Channel Setting Sensing Sensitivity: 0.5 mV
Pulse Gen Serial Number: 9829382

## 2022-09-05 NOTE — Progress Notes (Signed)
Remote ICD transmission.   

## 2022-09-13 ENCOUNTER — Other Ambulatory Visit (HOSPITAL_COMMUNITY): Payer: Self-pay | Admitting: Cardiology

## 2022-09-13 DIAGNOSIS — I5022 Chronic systolic (congestive) heart failure: Secondary | ICD-10-CM

## 2022-09-21 ENCOUNTER — Telehealth: Payer: Self-pay | Admitting: *Deleted

## 2022-09-21 DIAGNOSIS — K625 Hemorrhage of anus and rectum: Secondary | ICD-10-CM | POA: Diagnosis not present

## 2022-09-21 DIAGNOSIS — K5900 Constipation, unspecified: Secondary | ICD-10-CM | POA: Diagnosis not present

## 2022-09-21 DIAGNOSIS — I509 Heart failure, unspecified: Secondary | ICD-10-CM | POA: Diagnosis not present

## 2022-09-21 NOTE — Telephone Encounter (Signed)
   Pre-operative Risk Assessment    Patient Name: [name]  DOB: [birth year] MRN: [id number]    DATE OF LAST VISIT: 07/15/22 JESSICA MILFORD, FNP DATE OF NEXT VISIT: 11/10/22 DR. Gala Romney AND 01/16/23 RENEE URSUY,PAC  Request for Surgical Clearance    Procedure:   COLONOSCOPY;BRIGHT RED RECTAL BLEEDING, CONSTIPATION  Date of Surgery:  Clearance TBD                                 Surgeon:  DR. Ewing Schlein Surgeon's Group or Practice Name:  [facility] Phone number:  [phone] Fax number:  [phone]   Type of Clearance Requested:   - Medical  - Pharmacy:  Hold Rivaroxaban (Xarelto)     Type of Anesthesia:   PROPOFOL   Additional requests/questions:    [name]   09/21/2022, 4:26 PM

## 2022-09-22 ENCOUNTER — Other Ambulatory Visit: Payer: Self-pay | Admitting: Internal Medicine

## 2022-09-22 ENCOUNTER — Telehealth: Payer: Self-pay | Admitting: *Deleted

## 2022-09-22 DIAGNOSIS — I5022 Chronic systolic (congestive) heart failure: Secondary | ICD-10-CM

## 2022-09-22 NOTE — Telephone Encounter (Signed)
Patient with diagnosis of afib on Xarelto for anticoagulation.    Procedure: COLONOSCOPY;BRIGHT RED RECTAL BLEEDING, CONSTIPATION  Date of procedure: TBD   CHA2DS2-VASc Score = 4   This indicates a 4.8% annual risk of stroke. The patient's score is based upon: CHF History: 1 HTN History: 1 Diabetes History: 1 Stroke History: 0 Vascular Disease History: 0 Age Score: 1 Gender Score: 0      CrCl 106 ml/min  Per office protocol, patient can hold Xarelto for 2 days prior to procedure.    **This guidance is not considered finalized until pre-operative APP has relayed final recommendations.**

## 2022-09-22 NOTE — Telephone Encounter (Signed)
Pt has been added to the tele schedule, 09/26/22, ok per EM.  Consent on file / medications reconciled      Patient Consent for Virtual Visit        [name] has provided verbal consent on 09/22/2022 for a virtual visit (video or telephone).   CONSENT FOR VIRTUAL VISIT FOR:  [name]  By participating in this virtual visit I agree to the following:  I hereby voluntarily request, consent and authorize [facility] and its employed or contracted physicians, physician assistants, nurse practitioners or other licensed health care professionals (the Practitioner), to provide me with telemedicine health care services (the "Services") as deemed necessary by the treating Practitioner. I acknowledge and consent to receive the Services by the Practitioner via telemedicine. I understand that the telemedicine visit will involve communicating with the Practitioner through live audiovisual communication technology and the disclosure of certain medical information by electronic transmission. I acknowledge that I have been given the opportunity to request an in-person assessment or other available alternative prior to the telemedicine visit and am voluntarily participating in the telemedicine visit.  I understand that I have the right to withhold or withdraw my consent to the use of telemedicine in the course of my care at any time, without affecting my right to future care or treatment, and that the Practitioner or I may terminate the telemedicine visit at any time. I understand that I have the right to inspect all information obtained and/or recorded in the course of the telemedicine visit and may receive copies of available information for a reasonable fee.  I understand that some of the potential risks of receiving the Services via telemedicine include:  Delay or interruption in medical evaluation due to technological equipment failure or disruption; Information transmitted may not  be sufficient (e.g. poor resolution of images) to allow for appropriate medical decision making by the Practitioner; and/or  In rare instances, security protocols could fail, causing a breach of personal health information.  Furthermore, I acknowledge that it is my responsibility to provide information about my medical history, conditions and care that is complete and accurate to the best of my ability. I acknowledge that Practitioner's advice, recommendations, and/or decision may be based on factors not within their control, such as incomplete or inaccurate data provided by me or distortions of diagnostic images or specimens that may result from electronic transmissions. I understand that the practice of medicine is not an exact science and that Practitioner makes no warranties or guarantees regarding treatment outcomes. I acknowledge that a copy of this consent can be made available to me via my patient portal [facility] MyChart), or I can request a printed copy by calling the office of [facility].    I understand that my insurance will be billed for this visit.   I have read or had this consent read to me. I understand the contents of this consent, which adequately explains the benefits and risks of the Services being provided via telemedicine.  I have been provided ample opportunity to ask questions regarding this consent and the Services and have had my questions answered to my satisfaction. I give my informed consent for the services to be provided through the use of telemedicine in my medical care

## 2022-09-22 NOTE — Telephone Encounter (Signed)
   Name: [name]  DOB: 11-17-1955  MRN: [id number]  Primary Cardiologist: [name], MD   Preoperative team, please contact this patient and set up a phone call appointment for further preoperative risk assessment. Please obtain consent and complete medication review. Thank you for your help.  I confirm that guidance regarding antiplatelet and oral anticoagulation therapy has been completed and, if necessary, noted below.  Per office protocol, patient can hold Xarelto for 2 days prior to procedure. Please resume Xarelto as soon as possible postprocedure, at the discretion of the surgeon.     Joylene Grapes, NP 09/22/2022, 8:09 AM [facility]

## 2022-09-22 NOTE — Telephone Encounter (Signed)
Pt has been added to the tele schedule, 09/26/22, ok per EM.  Consent on file / medications reconciled

## 2022-09-26 ENCOUNTER — Telehealth: Payer: Self-pay | Admitting: *Deleted

## 2022-09-26 ENCOUNTER — Ambulatory Visit: Payer: 59

## 2022-09-26 NOTE — Telephone Encounter (Signed)
Spoke with patient aware of needing  to go to appointments with HF clinic

## 2022-09-26 NOTE — Progress Notes (Deleted)
Virtual Visit via Telephone Note   Because of [name]'s co-morbid illnesses, he is at least at moderate risk for complications without adequate follow up.  This format is felt to be most appropriate for this patient at this time.  The patient did not have access to video technology/had technical difficulties with video requiring transitioning to audio format only (telephone).  All issues noted in this document were discussed and addressed.  No physical exam could be performed with this format.  Please refer to the patient's chart for his consent to telehealth for [name] [facility].  Evaluation Performed:  Preoperative cardiovascular risk assessment _____________   Date:  09/26/2022   Patient ID:  [name], DOB [birth year], MRN [id number] Patient Location:  Home Provider location:   Office  Primary Care Provider:  Georgann Housekeeper, MD Primary Cardiologist:  [name], MD  Chief Complaint / Patient Profile   67 y.o. y/o male with a h/o *** who is pending colonoscopy for  and presents today for telephonic preoperative cardiovascular risk assessment.  History of Present Illness    [name] is a 68 y.o. male who presents via Web designer for a telehealth visit today.  Pt was last seen in cardiology clinic on *** by ***.  At that time [name] was doing well ***.  The patient is now pending procedure as outlined above. Since his last visit, he ***  Past Medical History    Past Medical History:  Diagnosis Date   Asthma    Automatic implantable cardioverter-defibrillator in situ    Back pain    Chronic systolic CHF (congestive heart failure), NYHA class 3 (HCC)    Complete heart block (HCC)    s/p PPM 1997 with upgrade to AICD 2011   Depression    Dilated aortic root (HCC)    40mm ascending aortic root   DM (diabetes mellitus) (HCC)    Glaucoma    HTN (hypertension)    Hyperlipidemia    Nonischemic cardiomyopathy (HCC)     EF of 15-20% by echo 2015 and 20% by nuclear stress test 2017   Obesity    PAF (paroxysmal atrial fibrillation) (HCC)    13 minutes of PAF documented on ICD check 2018 - anticoagulated with Eliquis   Sarcoid    Sleep apnea 06-28-12   no cpap used-unable to tolerate   SVT (supraventricular tachycardia)    s/p ablation   Past Surgical History:  Procedure Laterality Date   COLONOSCOPY WITH PROPOFOL N/A 07/17/2012   Procedure: COLONOSCOPY WITH PROPOFOL;  Surgeon: Charolett Bumpers, MD;  Location: [facility] ENDOSCOPY;  Service: Endoscopy;  Laterality: N/A;   HEMORRHOID SURGERY     early 20's   ICD GENERATOR CHANGEOUT N/A 05/28/2018   Procedure: ICD GENERATOR CHANGEOUT;  Surgeon: Marinus Maw, MD;  Location: [facility] INVASIVE CV LAB;  Service: Cardiovascular;  Laterality: N/A;   IR US GUIDE VASC ACCESS LEFT  12/21/2021   IR VENO/EXT/UNI LEFT  12/21/2021   PACEMAKER INSERTION     RIGHT/LEFT HEART CATH AND CORONARY ANGIOGRAPHY N/A 04/14/2021   Procedure: RIGHT/LEFT HEART CATH AND CORONARY ANGIOGRAPHY;  Surgeon: Dolores Patty, MD;  Location: [facility] INVASIVE CV LAB;  Service: Cardiovascular;  Laterality: N/A;   TONSILLECTOMY      Allergies  Allergies  Allergen Reactions   Penicillins Other (See Comments)    Dizziness Did it involve swelling of the face/tongue/throat, SOB, or low BP? Yes Did it involve sudden or severe rash/hives,  skin peeling, or any reaction on the inside of your mouth or nose? No Did you need to seek medical attention at a hospital or doctor's office? Yes When did it last happen?      childhood If all above answers are "NO", may proceed with cephalosporin use.     Home Medications    Prior to Admission medications   Medication Sig Start Date End Date Taking? Authorizing Provider  Aspirin-Caffeine (BC FAST PAIN RELIEF PO) Take 1 packet by mouth daily as needed (pain).    [provider]  ENTRESTO 97-103 MG TAKE 1 TABLET BY MOUTH 2 (TWO) TIMES DAILY. NEEDS FOLLOW UP  APPOINTMENT FOR MORE REFILLS 09/23/22   Bensimhon, Bevelyn Buckles, MD  furosemide (LASIX) 40 MG tablet TAKE 1 TABLET (40 MG TOTAL) BY MOUTH DAILY. NEEDS FOLLOW UP APPOINTMENT FOR MORE REFILLS 09/23/22   Bensimhon, Bevelyn Buckles, MD  JARDIANCE 25 MG TABS tablet Take 25 mg by mouth daily. 06/05/20   [provider]  KLOR-CON M20 20 MEQ tablet TAKE 1 TABLET (20 MEQ TOTAL) BY MOUTH AS NEEDED. WITH LASIX DOSE ONLY 11/15/21   [place], [place], [place]  MAGNESIUM PO Take 200 mg by mouth every other day.    [provider]  metoprolol succinate (TOPROL-XL) 50 MG 24 hr tablet TAKE 1 TABLET BY MOUTH DAILY WITH OR IMMEDIATELY FOLLOWING A MEAL 09/23/22   Bensimhon, Bevelyn Buckles, MD  MOUNJARO 2.5 MG/0.5ML Pen Inject 12.5 mg into the skin every Friday. 12/01/21   [provider]  RESTASIS 0.05 % ophthalmic emulsion Place 1 drop into both eyes 2 (two) times daily as needed (dry eyes). 10/11/21   [provider]  rivaroxaban (XARELTO) 20 MG TABS tablet Take 1 tablet (20 mg total) by mouth daily with supper. 10/21/16   Quintella Reichert, MD  rosuvastatin (CRESTOR) 20 MG tablet Take 20 mg by mouth daily. 02/10/21   [provider]  spironolactone (ALDACTONE) 25 MG tablet TAKE 1 TABLET (25 MG TOTAL) BY MOUTH DAILY. 07/19/22   Bensimhon, Bevelyn Buckles, MD  thiamine (VITAMIN B-1) 50 MG tablet Take 50 mg by mouth daily.    [provider]  tiZANidine (ZANAFLEX) 4 MG capsule Take 4 mg by mouth at bedtime as needed for muscle spasms.    [provider]  TRESIBA FLEXTOUCH 200 UNIT/ML SOPN Inject 40 Units into the skin every morning. 03/26/18   [provider]  zinc gluconate 50 MG tablet Take 50 mg by mouth every other day.    [provider]    Physical Exam    Vital Signs:  Ayce L Hoffman does not have vital signs available for review today.***  Given telephonic nature of communication, physical exam is limited. AAOx3. NAD. Normal affect.  Speech and respirations are  unlabored.  Accessory Clinical Findings    None  Assessment & Plan    1.  Preoperative Cardiovascular Risk Assessment:   CHA2DS2-VASc Score = 4   This indicates a 4.8% annual risk of stroke. The patient's score is based upon: CHF History: 1 HTN History: 1 Diabetes History: 1 Stroke History: 0 Vascular Disease History: 0 Age Score: 1 Gender Score: 0  CrCl 106 ml/min   Per office protocol, patient can hold Xarelto for 2 days prior to procedure.    The patient was advised that if he develops new symptoms prior to surgery to contact our office to arrange for a follow-up visit, and he verbalized understanding.  (Reminder: Include SBE prophylaxis/Antiplatelet/Anticoag Instructions***)  A copy of this note will be routed to requesting surgeon.  Time:   Today, I have spent *** minutes with the patient with telehealth technology discussing medical history, symptoms, and management plan.     Rip Harbour, NP  09/26/2022, 9:50 AM

## 2022-10-14 ENCOUNTER — Telehealth: Payer: Self-pay | Admitting: Internal Medicine

## 2022-10-14 NOTE — Telephone Encounter (Signed)
Pt aware does not require SBE prior to teeth extraction /cy

## 2022-10-14 NOTE — Telephone Encounter (Signed)
Pt c/o medication issue:  1. Name of Medication: Antibiotics  2. How are you currently taking this medication (dosage and times per day)?   3. Are you having a reaction (difficulty breathing--STAT)?   4. What is your medication issue?   Patient stated he will be having teeth extracted and wants to know if he can take antibiotics prior to the extractions.

## 2022-10-27 ENCOUNTER — Telehealth: Payer: Self-pay

## 2022-10-27 NOTE — Telephone Encounter (Signed)
Following alert received from [employer] ventricular lead noise reversion. Received for noise appear to be EMI.  Patient reports during this time, he was plugging in space heater. States he took the heater due to it not working correctly. Advised patient we will continue to monitor and if see it occur again, someone will contact him. Pt voiced understanding and appreciative of call.

## 2022-11-03 DIAGNOSIS — M4690 Unspecified inflammatory spondylopathy, site unspecified: Secondary | ICD-10-CM | POA: Diagnosis not present

## 2022-11-03 DIAGNOSIS — Z23 Encounter for immunization: Secondary | ICD-10-CM | POA: Diagnosis not present

## 2022-11-03 DIAGNOSIS — I77810 Thoracic aortic ectasia: Secondary | ICD-10-CM | POA: Diagnosis not present

## 2022-11-03 DIAGNOSIS — D6869 Other thrombophilia: Secondary | ICD-10-CM | POA: Diagnosis not present

## 2022-11-03 DIAGNOSIS — E611 Iron deficiency: Secondary | ICD-10-CM | POA: Diagnosis not present

## 2022-11-03 DIAGNOSIS — E1142 Type 2 diabetes mellitus with diabetic polyneuropathy: Secondary | ICD-10-CM | POA: Diagnosis not present

## 2022-11-03 DIAGNOSIS — Z0000 Encounter for general adult medical examination without abnormal findings: Secondary | ICD-10-CM | POA: Diagnosis not present

## 2022-11-03 DIAGNOSIS — I700 Atherosclerosis of aorta: Secondary | ICD-10-CM | POA: Diagnosis not present

## 2022-11-03 DIAGNOSIS — I10 Essential (primary) hypertension: Secondary | ICD-10-CM | POA: Diagnosis not present

## 2022-11-03 DIAGNOSIS — Z95810 Presence of automatic (implantable) cardiac defibrillator: Secondary | ICD-10-CM | POA: Diagnosis not present

## 2022-11-03 DIAGNOSIS — I110 Hypertensive heart disease with heart failure: Secondary | ICD-10-CM | POA: Diagnosis not present

## 2022-11-03 DIAGNOSIS — I5022 Chronic systolic (congestive) heart failure: Secondary | ICD-10-CM | POA: Diagnosis not present

## 2022-11-03 DIAGNOSIS — E782 Mixed hyperlipidemia: Secondary | ICD-10-CM | POA: Diagnosis not present

## 2022-11-10 ENCOUNTER — Ambulatory Visit (HOSPITAL_COMMUNITY)
Admission: RE | Admit: 2022-11-10 | Discharge: 2022-11-10 | Disposition: A | Discharge status: Home / Self Care | Payer: 59 | Source: Ambulatory Visit | Attending: Internal Medicine | Admitting: Internal Medicine

## 2022-11-10 ENCOUNTER — Ambulatory Visit (HOSPITAL_BASED_OUTPATIENT_CLINIC_OR_DEPARTMENT_OTHER)
Admission: RE | Admit: 2022-11-10 | Discharge: 2022-11-10 | Disposition: A | Discharge status: Home / Self Care | Payer: 59 | Source: Ambulatory Visit | Attending: Internal Medicine | Admitting: Internal Medicine

## 2022-11-10 ENCOUNTER — Encounter (HOSPITAL_COMMUNITY): Payer: Self-pay | Admitting: Internal Medicine

## 2022-11-10 VITALS — BP 104/60 | HR 78 | Wt 349.2 lb

## 2022-11-10 DIAGNOSIS — E785 Hyperlipidemia, unspecified: Secondary | ICD-10-CM | POA: Diagnosis not present

## 2022-11-10 DIAGNOSIS — I5022 Chronic systolic (congestive) heart failure: Secondary | ICD-10-CM

## 2022-11-10 DIAGNOSIS — I10 Essential (primary) hypertension: Secondary | ICD-10-CM

## 2022-11-10 DIAGNOSIS — G4733 Obstructive sleep apnea (adult) (pediatric): Secondary | ICD-10-CM

## 2022-11-10 DIAGNOSIS — E119 Type 2 diabetes mellitus without complications: Secondary | ICD-10-CM | POA: Insufficient documentation

## 2022-11-10 DIAGNOSIS — I110 Hypertensive heart disease with heart failure: Secondary | ICD-10-CM | POA: Diagnosis not present

## 2022-11-10 DIAGNOSIS — Z95810 Presence of automatic (implantable) cardiac defibrillator: Secondary | ICD-10-CM

## 2022-11-10 DIAGNOSIS — I447 Left bundle-branch block, unspecified: Secondary | ICD-10-CM | POA: Diagnosis not present

## 2022-11-10 LAB — ECHOCARDIOGRAM COMPLETE
AR max vel: 3.48 cm2
AV Area VTI: 3.42 cm2
AV Area mean vel: 3.18 cm2
AV Mean grad: 4 mm[Hg]
AV Peak grad: 7.3 mm[Hg]
Ao pk vel: 1.35 m/s
Area-P 1/2: 5.02 cm2
S' Lateral: 5.6 cm

## 2022-11-10 MED ORDER — PERFLUTREN LIPID MICROSPHERE
3.0000 mL | INTRAVENOUS | Status: AC | PRN
  Administered 2022-11-10: 3 mL via INTRAVENOUS

## 2022-11-10 NOTE — Patient Instructions (Signed)
There has been no changes to your medications.  Your physician recommends that you schedule a follow-up appointment in: 6 months   If you have any questions or concerns before your next appointment please send [place] a message through [place] or call our office at [phone].    TO LEAVE A MESSAGE FOR THE NURSE SELECT OPTION 2, PLEASE LEAVE A MESSAGE INCLUDING: YOUR NAME DATE OF BIRTH CALL BACK NUMBER REASON FOR CALL**this is important as we prioritize the call backs  YOU WILL RECEIVE A CALL BACK THE SAME DAY AS LONG AS YOU CALL BEFORE 4:00 PM  At the [facility], you and your health needs are our priority. As part of our continuing mission to provide you with exceptional heart care, we have created designated Provider Care Teams. These Care Teams include your primary Cardiologist (physician) and Advanced Practice Providers (APPs- Physician Assistants and Nurse Practitioners) who all work together to provide you with the care you need, when you need it.   You may see any of the following providers on your designated Care Team at your next follow up: Dr Arvilla Meres Dr Marca Ancona Dr. Dorthula Nettles Dr. Clearnce Hasten Amy Filbert Schilder, NP Robbie Lis, [place] [facility] [place], [place] Brynda Peon, NP [place] Lee, NP Karle Plumber, PharmD   Please be sure to bring in all your medications bottles to every appointment.    Thank you for choosing [facility]

## 2022-11-10 NOTE — Progress Notes (Signed)
[facility] Note  Primary Care: Georgann Housekeeper, MD Primary Cardiologist: [name], MD HF Cardiologist: Dr. Gala Romney  HPI: [name] is a 67 y.o. male with a obesity, systolic HF due to NICM (normal coronary arteries by cath 1996), pulmonary sarcoidosis (biopsy proven), DM2, HTN,  OSA (intolerant of CPAP), CHB s/p dual chamber AICD and SVT s/p ablation.  Referred by Dr. [name] for further evaluation of his HF.   Diagnosed with pulmonary sarcoid in 1992 or 1993 (biopsy proven). Treated with steroids for several months. Told it was burned out.  Had SVT ablation c/b CHB and underwent PPM placement   Cath in 1996. Normal coronary arteries.    Myoview 2011 EF 28% large inferior scar Echo 2013 EF 35-40%  Echo 2015 15-20%  Myoview 2017. EF 20% large scar Echo 2019 EF 15-20% Echo 1/23 EF 15-20%   [employer] Jude BiV ICD implanted 1996, upgrade 2011, gen change 2020 for CHF (LV port capped). He has previously been offered CRT upgrade and has declined  Follow up 3/23. Arranged L/RHC and sleep study. R/LHC (4/23) showed normal cors, EF 20%, well compensated hemodynamics except for reduced PAPi.  Follow up 9/23, RV pacing 98%, referred to EP. Follow up 12/23 with EP to discuss CRT-D upgrade, unfortunately found to have occluded L SCV vein and unable to undergo BiV upgrade.  Today he returns for HF follow up. Overall feeling pretty good. Does total gym for 15 mins every day. Otherwise not very active. No CP or SOB. No edema. Compliant with meds   ICD interrogated personally; No AF. 6 second run NSVT x 1. Fluid ok. Activity level 2hr/day 98* V pacing Personally reviewed   Echo today 11/10/22: EF 20-25% dilated severe dysynchrony due to RV pacing    Cardiac Studies: - Sleep study (6/23): revealed moderate sleep apnea.   - R/LHC (4/23): normal cors, Relatively well compensated hemodynamics except for reduced PAPi   The left ventricular ejection fraction is less  than 25% by visual estimate. Ao = 107/62 (80) LV = 103/17 RA = 12 RV = 36/12 PA = 35/17 (24) PCW = 13 Fick cardiac output/index = 7.9/3.0 PVR = 1.4 WU SVR = 665  FA sat = 98% PA sat = 71%, 74% PAPi = 1.5  - Echo 1/23 EF 15-20%  - Echo 2019 EF 15-20% - Myoview 2017. EF 20% large scar - Echo 2015 15-20%  - Echo 2013 EF 35-40%  - Myoview 2011 EF 28% large inferior scar  Past Medical History:  Diagnosis Date   Asthma    Automatic implantable cardioverter-defibrillator in situ    Back pain    Chronic systolic CHF (congestive heart failure), NYHA class 3 (HCC)    Complete heart block (HCC)    s/p PPM 1997 with upgrade to AICD 2011   Depression    Dilated aortic root (HCC)    40mm ascending aortic root   DM (diabetes mellitus) (HCC)    Glaucoma    HTN (hypertension)    Hyperlipidemia    Nonischemic cardiomyopathy (HCC)    EF of 15-20% by echo 2015 and 20% by nuclear stress test 2017   Obesity    PAF (paroxysmal atrial fibrillation) (HCC)    13 minutes of PAF documented on ICD check 2018 - anticoagulated with Eliquis   Sarcoid    Sleep apnea 06-28-12   no cpap used-unable to tolerate   SVT (supraventricular tachycardia) (HCC)    s/p ablation   Current Outpatient  Medications  Medication Sig Dispense Refill   Aspirin-Caffeine (BC FAST PAIN RELIEF PO) Take 1 packet by mouth daily as needed (pain).     ENTRESTO 97-103 MG TAKE 1 TABLET BY MOUTH 2 (TWO) TIMES DAILY. NEEDS FOLLOW UP APPOINTMENT FOR MORE REFILLS 180 tablet 0   furosemide (LASIX) 40 MG tablet Take 40 mg by mouth daily.     JARDIANCE 25 MG TABS tablet Take 25 mg by mouth daily.     KLOR-CON M20 20 MEQ tablet TAKE 1 TABLET (20 MEQ TOTAL) BY MOUTH AS NEEDED. WITH LASIX DOSE ONLY 90 tablet 2   MAGNESIUM PO Take 200 mg by mouth every other day.     metoprolol succinate (TOPROL-XL) 50 MG 24 hr tablet TAKE 1 TABLET BY MOUTH DAILY WITH OR IMMEDIATELY FOLLOWING A MEAL 90 tablet 0   MOUNJARO 2.5 MG/0.5ML Pen Inject 12.5  mg into the skin every Friday.     RESTASIS 0.05 % ophthalmic emulsion Place 1 drop into both eyes 2 (two) times daily as needed (dry eyes).     rivaroxaban (XARELTO) 20 MG TABS tablet Take 1 tablet (20 mg total) by mouth daily with supper. 30 tablet 11   rosuvastatin (CRESTOR) 20 MG tablet Take 20 mg by mouth daily.     spironolactone (ALDACTONE) 25 MG tablet TAKE 1 TABLET (25 MG TOTAL) BY MOUTH DAILY. 90 tablet 3   thiamine (VITAMIN B-1) 50 MG tablet Take 50 mg by mouth daily.     tiZANidine (ZANAFLEX) 4 MG capsule Take 4 mg by mouth at bedtime as needed for muscle spasms.     TRESIBA FLEXTOUCH 200 UNIT/ML SOPN Inject 40 Units into the skin every morning.     zinc gluconate 50 MG tablet Take 50 mg by mouth every other day.     No current facility-administered medications for this encounter.   Facility-Administered Medications Ordered in Other Encounters  Medication Dose Route Frequency Provider Last Rate Last Admin   perflutren lipid microspheres (DEFINITY) IV suspension  3 mL Intravenous PRN Marykate Heuberger, Bevelyn Buckles, MD   3 mL at 11/10/22 1101   technetium pyrophosphate Tc 63m injection 21.1 millicurie  21.1 millicurie Intravenous Once Lars Masson, MD       Allergies  Allergen Reactions   Penicillins Other (See Comments)    Dizziness Did it involve swelling of the face/tongue/throat, SOB, or low BP? Yes Did it involve sudden or severe rash/hives, skin peeling, or any reaction on the inside of your mouth or nose? No Did you need to seek medical attention at a hospital or doctor's office? Yes When did it last happen?      childhood If all above answers are "NO", may proceed with cephalosporin use.    Social History   Socioeconomic History   Marital status: Legally Separated    Spouse name: Not on file   Number of children: 2   Years of education: GED   Highest education level: Not on file  Occupational History   Not on file  Tobacco Use   Smoking status: Never   Smokeless  tobacco: Never  Vaping Use   Vaping status: Never Used  Substance and Sexual Activity   Alcohol use: Not Currently    Comment: occasionally   Drug use: No   Sexual activity: Yes  Other Topics Concern   Not on file  Social History Narrative   Not on file   Social Determinants of Health   Financial Resource Strain: Low Risk  (03/25/2021)  Overall Financial Resource Strain (CARDIA)    Difficulty of Paying Living Expenses: Not very hard  Food Insecurity: No Food Insecurity (03/25/2021)   Hunger Vital Sign    Worried About Running Out of Food in the Last Year: Never true    Ran Out of Food in the Last Year: Never true  Transportation Needs: No Transportation Needs (03/25/2021)   PRAPARE - Administrator, Civil Service (Medical): No    Lack of Transportation (Non-Medical): No  Physical Activity: Not on file  Stress: Not on file  Social Connections: Not on file  Intimate Partner Violence: Not on file   Family History  Problem Relation Age of Onset   Diabetes Mother    Alzheimer's disease Mother    CVA Father    Heart failure Father    Epilepsy Sister    Diabetes Brother    Diabetes Sister    Wt Readings from Last 3 Encounters:  11/10/22 (!) 158.4 kg (349 lb 3.2 oz)  08/01/22 (!) 153.3 kg (338 lb)  07/15/22 (!) 155.7 kg (343 lb 3.2 oz)   BP 104/60   Pulse 78   Wt (!) 158.4 kg (349 lb 3.2 oz)   SpO2 94%   BMI 50.10 kg/m   PHYSICAL EXAM: General:  Well appearing. No resp difficulty HEENT: normal Neck: supple. no JVD. Carotids 2+ bilat; no bruits. No lymphadenopathy or thryomegaly appreciated. Cor: PMI nondisplaced. Regular rate & rhythm. No rubs, gallops or murmurs. Lungs: clear Abdomen: obese, soft, nontender, nondistended. No hepatosplenomegaly. No bruits or masses. Good bowel sounds. Extremities: no cyanosis, clubbing, rash, edema Neuro: alert & orientedx3, cranial nerves grossly intact. moves all 4 extremities w/o difficulty. Affect  pleasant    ASSESSMENT & PLAN: 1.  Chronic systolic CHF  - longstanding LV dysfunction. Etiology remains unclear - Last cath 1996 with normal coronaries however subsequent Myoview have suggested possible underlying iHD - which I would suspect given his CRFs. Also has chronic RV pacing - s/p SJ BiVICD (LV lead capped) - has refused upgrade to CRT - Echo (1/23): EF 20% RV ok  - R/LHC (4/23) normal cors, EF 20% NICM, well compensated hemodynamics, except low PAPi; RA 12, PA 35/17 (24), PCW 13, CO/CI (Fick) 7.9/3.0, PAPi 1.5 - Doubt cardiac sarcoid, with wide QRS, suspect RV pacing CM (see below) - Echo today 11/10/22: EF 20-25% dilated severe dysynchrony due to RV pacing  - Stable NYHA II. Volume ok on exa, and device interrogation - Continue Entresto 97-103 mg bid. - Continue Toprol XL 50 mg daily. - Continue spiro 25 mg daily. - Continue Jardiance 25 mg daily. - Continue Lasix 40 mg daily. OK to take extra 40 mg PRN w/ 20 KCL PRN. - PYP scan 4/20 equivocal for amyloid. Urine and protein electrophoresis were normal. - Labs today - He has persistent, severe LV dysfunction. Echo suggestive of LBBB cardiomyopathy. Long discussion about pros/cons of device extraction followed be re-implantation of CRT device. I have asked him to f/u with EP and I have d/w EP as well   2. HTN  - Blood pressure well controlled. Continue current regimen.  3. CHB   - Resulted from SVT ablation - he has STJ BiV ICD in place but LV lead capped. Has refused CRT upgrade in the past.  - Saw Dr Ladona Ridgel regarding CRT-D, but L SCV occluded. Extraction and BiV upgrade increased risk See discussion above. EF still markedly depressed. I think we should revisit    4. PAF  -  he was noted on ICD check in 2018 to have PAF. - Regular on exam today, no AF on device interrogation today - Continue Xarelto. Mild occasional rectal bleeding with BMs.  5. OSA - moderate by sleep study 6/23  - Unable to tolerate CPAP - Follows  with Dr. [name]. - No change  I spent a total of 45 minutes today: 1) reviewing the patient's medical records including previous charts, labs and recent notes from other providers; 2) examining the patient and counseling them on their medical issues/explaining the plan of care; 3) adjusting meds as needed and 4) ordering lab work or other needed tests.    Arvilla Meres, MD  11:40 AM

## 2022-11-18 ENCOUNTER — Encounter: Payer: Self-pay | Admitting: Internal Medicine

## 2022-11-25 ENCOUNTER — Ambulatory Visit (INDEPENDENT_AMBULATORY_CARE_PROVIDER_SITE_OTHER): Payer: 59

## 2022-11-25 DIAGNOSIS — I442 Atrioventricular block, complete: Secondary | ICD-10-CM

## 2022-11-26 LAB — CUP PACEART REMOTE DEVICE CHECK
Battery Remaining Longevity: 40 mo
Battery Remaining Percentage: 42 %
Battery Voltage: 2.92 V
Brady Statistic AP VP Percent: 13 %
Brady Statistic AP VS Percent: 1 %
Brady Statistic AS VP Percent: 85 %
Brady Statistic AS VS Percent: 1 %
Brady Statistic RA Percent Paced: 12 %
Brady Statistic RV Percent Paced: 98 %
Date Time Interrogation Session: 20241115020015
HighPow Impedance: 49 Ohm
HighPow Impedance: 49 Ohm
Lead Channel Impedance Value: 380 Ohm
Lead Channel Impedance Value: 510 Ohm
Lead Channel Pacing Threshold Amplitude: 0.875 V
Lead Channel Pacing Threshold Amplitude: 1 V
Lead Channel Pacing Threshold Pulse Width: 0.5 ms
Lead Channel Pacing Threshold Pulse Width: 0.5 ms
Lead Channel Sensing Intrinsic Amplitude: 12 mV
Lead Channel Sensing Intrinsic Amplitude: 2 mV
Lead Channel Setting Pacing Amplitude: 2 V
Lead Channel Setting Pacing Amplitude: 2 V
Lead Channel Setting Pacing Pulse Width: 0.5 ms
Lead Channel Setting Sensing Sensitivity: 0.5 mV
Pulse Gen Serial Number: 9829382

## 2022-12-07 NOTE — Progress Notes (Signed)
Remote ICD transmission.   

## 2022-12-19 ENCOUNTER — Telehealth: Payer: Self-pay | Admitting: *Deleted

## 2022-12-19 NOTE — Telephone Encounter (Signed)
   Pre-operative Risk Assessment    Patient Name: [name]  DOB: 1955/07/01 MRN: [id number]  DATE OF LAST VISIT: 11/10/22 DR. Gala Romney DATE OF NEXT VISIT: 01/16/23 Francis Dowse, [facility]    Request for Surgical Clearance    Procedure:   COLONOSCOPY  Date of Surgery:  Clearance TBD                                 Surgeon:  DR. Dulce Sellar Surgeon's Group or Practice Name:  [facility] Phone number:  [phone] Fax number:  [phone]   Type of Clearance Requested:   - Medical  - Pharmacy:  Hold Rivaroxaban (Xarelto)     Type of Anesthesia:   PROPOFOL   Additional requests/questions:    [name]   12/19/2022, 9:54 AM

## 2022-12-21 NOTE — Telephone Encounter (Signed)
   Name: [name]  DOB: 01-04-1956  MRN: [id number]  Primary Cardiologist: [name], MD   Preoperative team, please contact this patient and set up a phone call appointment for further preoperative risk assessment. Please obtain consent and complete medication review. Thank you for your help.Last seen by Dr. Gala Romney on 11/10/2022.   I confirm that guidance regarding antiplatelet and oral anticoagulation therapy has been completed and, if necessary, noted below.  Per office protocol, patient can hold Xarelto for 2 days prior to procedure.   Patient will not need bridging with Lovenox (enoxaparin) around procedure.  I also confirmed the patient resides in the state of [place]. As per [facility] Medical Board telemedicine laws, the patient must reside in the state in which the provider is licensed.   Joni Reining, NP 12/21/2022, 3:48 PM [facility]

## 2022-12-21 NOTE — Telephone Encounter (Signed)
Patient with diagnosis of atrial fibrillation on xarelto for anticoagulation.    Procedure:   COLONOSCOPY   Date of Surgery:  Clearance TBD   CHA2DS2-VASc Score = 4   This indicates a 4.8% annual risk of stroke. The patient's score is based upon: CHF History: 1 HTN History: 1 Diabetes History: 1 Stroke History: 0 Vascular Disease History: 0 Age Score: 1 Gender Score: 0   CrCl 109 (with adjusted body weight) Platelet count 257  Per office protocol, patient can hold Xarelto for 2 days prior to procedure.   Patient will not need bridging with Lovenox (enoxaparin) around procedure.  **This guidance is not considered finalized until pre-operative APP has relayed final recommendations.**

## 2022-12-22 NOTE — Telephone Encounter (Signed)
Per preop APP today ok to defer clearance to appt with Francis Dowse, [facility] 01/16/23. I will update all parties involved.

## 2022-12-23 ENCOUNTER — Other Ambulatory Visit: Payer: Self-pay | Admitting: Internal Medicine

## 2022-12-24 ENCOUNTER — Other Ambulatory Visit (HOSPITAL_COMMUNITY): Payer: Self-pay | Admitting: Internal Medicine

## 2023-01-13 ENCOUNTER — Other Ambulatory Visit: Payer: Self-pay | Admitting: Internal Medicine

## 2023-01-13 DIAGNOSIS — I5022 Chronic systolic (congestive) heart failure: Secondary | ICD-10-CM

## 2023-01-15 NOTE — Progress Notes (Signed)
 Cardiology Office Note:  .   Date:  01/15/2023  ID:  [name], DOB 1955/08/13, MRN [id number] PCP: Jacob Other, MD  [facility] Providers Cardiologist:  Jacob Bihari, MD AHF: Dr. Cherrie Walsh:  Jacob Birmingham, MD   History of Present Illness: .   [name] is a 68 y.o. male w/PMHx of NICM, chronic CHF, pulmonary sarcoidosis (biopsy proven), DM, HTN, OSA (intolerant of CPAP), SVT (ablation complicated by CHB > PPM), > ICD, AFib  He saw Dr. Birmingham Dec 2023, back at the gym, doing well (despite his super morbid obesity). Discussed indication for upgrade to CRT, planned for venogram, if open would pursue upgrade, if not could discussed extraction > upgrade  Vein was occluded Pt did not want to pursue upgrade (requiring extraction procedure 1st)  Saw Dr. Cherrie 11/10/22, feeling well going to the gym 79min/day otherwise not particularly active Echo noted EF 20-25% dilated severe dysynchrony due to RV pacing  Today's visit is scheduled as an annual visit Discussed his hx of occl SCV and decision not to pursue extraction > upgrade though felt that this should be revisited.  ROS:   He reports feeling pretty good! Exercises at home about 4 days a week, calisthenics mostly, some weights, does some laps back yard > front yard and back, about 4 times weather permitting. Feels like he has good energy No CP, palpitations or cardiac awareness Minimal DOE No rest, nocturnal or positional SOB No near syncope or syncope. Occ blood with BM, otherwise no bleeding or signs of bleeding Thinks monjaro is making him constipated, pending a colonoscopy, looks like, HF team APP felt he was a reasonable candidate to proceed and hold xarelto  2 days as requested for colonoscopy  Home BP,s mostly 100's-110s   GT felt high risk 2/2 his massive obesity, wanted CTS evaluation 1st to decide his sternotomy candidacy (Dr. Lucas) Note that at the tme of his ICD  upgrade back in 2011, vein was patent though complicated/difficult 2/2 extensive fibrous tissue requiring dilation from 74F >> 11 F sheath to finally allow ICD lead > placement then complicated by significant  tortuosity and kinking of the sheath, attempts to place LV lead were abandoned    Device information dual chamber PPM implanted 12/23/1994 Gen change 2007 Upgraded to Abbott dual chamber ICD 12/11/2009 This procedure note reviewed: vein was patent though complicated/difficult 2/2 extensive fibrous tissue requiring dilation from 74F >> 11 F sheath to finally allow ICD lead > placement then complicated by significant  tortuosity and kinking of the sheath, attempts to place LV lead were abandoned RA is MDT 5524 lead from 12/23/1994 RV lead is a Durata 7121 from 12/11/2009 Gen change 05/28/2018 He has an abandoned RV pacing lead (I am unable to find model, presumably from 12/23/1994)  Studies Reviewed: SABRA    EKG done today and reviewed by myself:  SR/ V paced, 80bpm  DEVICE interrogation done today and reviewed by myself Battery and lead measurements are good No VT A couple PVCs today, not frequently Dependent, RV paces 98% AF burden <1% Numerous episodes, mostly AFlutter and ATach One noise episode, noted on both leads, suggests external He recalls no specifics of the day, remembers being called about it Noise reversion program identified it   Risk Assessment/Calculations:    Physical Exam:   VS:  There were no vitals taken for this visit.   Wt Readings from Last 3 Encounters:  11/10/22 (!) 349 lb 3.2 oz (158.4 kg)  08/01/22 ROLLEN)  338 lb (153.3 kg)  07/15/22 (!) 343 lb 3.2 oz (155.7 kg)    GEN: Well nourished, well developed in no acute distress NECK: No JVD; No carotid bruits CARDIAC: RRR, no murmurs, rubs, gallops RESPIRATORY:  CTA b/l without rales, wheezing or rhonchi  ABDOMEN: Soft, non-tender, non-distended EXTREMITIES:  No edema; No deformity   ICD site: is stable, no  thinning, fluctuation, tethering  ASSESSMENT AND PLAN: .    ICD Intact function No programming changes made  One noise episode, suggestive of external source, not recurrent  Long discussion today on rational for discussion of device upgrade now, before he starts to struggle with volume OL Discussed 2011, structural challenges and difficulties ultimately causing abandoning LV lead placement then. SCV is now known to be occluded RA lead and abandoned RV pacing lead are from 1996 Discussed Dr. Adrian concerns regarding the procedure for him/complications and need for CTS consult to see if he is sternotomy candidate, and rational for that  He is not particularly enthusiastic about the idea of the extraction portion of the procedure but willing to consult with Dr. Lucas and go from there. He will follow up with Dr. Waddell afterwards for final thoughts/recommendations/decisions  NICM Chronic CHF No symptoms or exam findings of volume OL  CorVueis above threshold/looks good C/w Dr. Boone  HTN Relative hypotension w/meds No symptoms No changes  5. Paroxysmal AFib 6. Atach CHA2DS2Vasc is 4, on Xarelto , appropriately dosed Low burden, mostly ATach  7. Secondary hypercoagulable state    Dispo: as above, remotes otherwise  Signed, Jacob Macario Arthur, PA-C

## 2023-01-16 ENCOUNTER — Encounter: Payer: Self-pay | Admitting: Physician Assistant

## 2023-01-16 ENCOUNTER — Ambulatory Visit: Payer: 59 | Attending: Physician Assistant | Admitting: Physician Assistant

## 2023-01-16 VITALS — BP 96/50 | HR 80 | Ht 70.0 in | Wt 346.0 lb

## 2023-01-16 DIAGNOSIS — I442 Atrioventricular block, complete: Secondary | ICD-10-CM | POA: Diagnosis not present

## 2023-01-16 DIAGNOSIS — D6869 Other thrombophilia: Secondary | ICD-10-CM | POA: Diagnosis not present

## 2023-01-16 DIAGNOSIS — I480 Paroxysmal atrial fibrillation: Secondary | ICD-10-CM

## 2023-01-16 DIAGNOSIS — I428 Other cardiomyopathies: Secondary | ICD-10-CM

## 2023-01-16 DIAGNOSIS — Z95810 Presence of automatic (implantable) cardiac defibrillator: Secondary | ICD-10-CM | POA: Diagnosis not present

## 2023-01-16 DIAGNOSIS — I5022 Chronic systolic (congestive) heart failure: Secondary | ICD-10-CM

## 2023-01-16 NOTE — Patient Instructions (Signed)
 Medication Instructions:   Your physician recommends that you continue on your current medications as directed. Please refer to the Current Medication list given to you today.   *If you need a refill on your cardiac medications before your next appointment, please call your pharmacy*   Lab Work: NONE ORDERED  TODAY     If you have labs (blood work) drawn today and your tests are completely normal, you will receive your results only by: MyChart Message (if you have MyChart) OR A paper copy in the mail If you have any lab test that is abnormal or we need to change your treatment, we will call you to review the results.   Testing/Procedures: NONE ORDERED  TODAY '     Follow-Up: At [facility], you and your health needs are our priority.  As part of our continuing mission to provide you with exceptional heart care, we have created designated Provider Care Teams.  These Care Teams include your primary Cardiologist (physician) and Advanced Practice Providers (APPs -  Physician Assistants and Nurse Practitioners) who all work together to provide you with the care you need, when you need it.  We recommend signing up for the patient portal called MyChart.  Sign up information is provided on this After Visit Summary.  MyChart is used to connect with patients for Virtual Visits (Telemedicine).  Patients are able to view lab/test results, encounter notes, upcoming appointments, etc.  Non-urgent messages can be sent to your provider as well.   To learn more about what you can do with MyChart, go to [email or web address].    Your next appointment:  You have been referred to Cardiothoracic Surgeon   2 month(s) ( CONTACT  CASSIE HALL/ ANGELINE HAMMER FOR EP SCHEDULING ISSUES )   Provider:    You may see Danelle Birmingham, MD ONLY!!   Other Instructions

## 2023-02-24 ENCOUNTER — Ambulatory Visit (INDEPENDENT_AMBULATORY_CARE_PROVIDER_SITE_OTHER): Payer: 59

## 2023-02-24 DIAGNOSIS — I442 Atrioventricular block, complete: Secondary | ICD-10-CM | POA: Diagnosis not present

## 2023-02-25 LAB — CUP PACEART REMOTE DEVICE CHECK
Battery Remaining Longevity: 36 mo
Battery Remaining Percentage: 38 %
Battery Voltage: 2.92 V
Brady Statistic AP VP Percent: 9.2 %
Brady Statistic AP VS Percent: 1 %
Brady Statistic AS VP Percent: 89 %
Brady Statistic AS VS Percent: 1 %
Brady Statistic RA Percent Paced: 8.5 %
Brady Statistic RV Percent Paced: 98 %
Date Time Interrogation Session: 20250214032557
HighPow Impedance: 47 Ohm
HighPow Impedance: 48 Ohm
Lead Channel Impedance Value: 340 Ohm
Lead Channel Impedance Value: 530 Ohm
Lead Channel Pacing Threshold Amplitude: 0.75 V
Lead Channel Pacing Threshold Amplitude: 0.875 V
Lead Channel Pacing Threshold Pulse Width: 0.5 ms
Lead Channel Pacing Threshold Pulse Width: 0.5 ms
Lead Channel Sensing Intrinsic Amplitude: 10.5 mV
Lead Channel Sensing Intrinsic Amplitude: 2.3 mV
Lead Channel Setting Pacing Amplitude: 2 V
Lead Channel Setting Pacing Amplitude: 2 V
Lead Channel Setting Pacing Pulse Width: 0.5 ms
Lead Channel Setting Sensing Sensitivity: 0.5 mV
Pulse Gen Serial Number: 9829382

## 2023-02-26 ENCOUNTER — Encounter: Payer: Self-pay | Admitting: Internal Medicine

## 2023-03-07 DIAGNOSIS — I77810 Thoracic aortic ectasia: Secondary | ICD-10-CM | POA: Diagnosis not present

## 2023-03-07 DIAGNOSIS — D509 Iron deficiency anemia, unspecified: Secondary | ICD-10-CM | POA: Diagnosis not present

## 2023-03-07 DIAGNOSIS — Z1159 Encounter for screening for other viral diseases: Secondary | ICD-10-CM | POA: Diagnosis not present

## 2023-03-07 DIAGNOSIS — Z0189 Encounter for other specified special examinations: Secondary | ICD-10-CM | POA: Diagnosis not present

## 2023-03-07 DIAGNOSIS — Z136 Encounter for screening for cardiovascular disorders: Secondary | ICD-10-CM | POA: Diagnosis not present

## 2023-03-07 DIAGNOSIS — Z79899 Other long term (current) drug therapy: Secondary | ICD-10-CM | POA: Diagnosis not present

## 2023-03-07 DIAGNOSIS — E559 Vitamin D deficiency, unspecified: Secondary | ICD-10-CM | POA: Diagnosis not present

## 2023-03-07 DIAGNOSIS — I428 Other cardiomyopathies: Secondary | ICD-10-CM | POA: Diagnosis not present

## 2023-03-07 DIAGNOSIS — I110 Hypertensive heart disease with heart failure: Secondary | ICD-10-CM | POA: Diagnosis not present

## 2023-03-07 DIAGNOSIS — E1169 Type 2 diabetes mellitus with other specified complication: Secondary | ICD-10-CM | POA: Diagnosis not present

## 2023-03-07 DIAGNOSIS — I5022 Chronic systolic (congestive) heart failure: Secondary | ICD-10-CM | POA: Diagnosis not present

## 2023-03-08 ENCOUNTER — Other Ambulatory Visit: Payer: Self-pay | Admitting: Gastroenterology

## 2023-03-10 ENCOUNTER — Telehealth (HOSPITAL_COMMUNITY): Payer: Self-pay

## 2023-03-10 NOTE — Telephone Encounter (Signed)
 Received a fax requesting medical records from [facility]. Records were successfully faxed to: [phone] ,which was the number provided.. Medical request form will be scanned into patients chart.

## 2023-03-15 ENCOUNTER — Encounter: Payer: Self-pay | Admitting: Surgery

## 2023-03-15 ENCOUNTER — Institutional Professional Consult (permissible substitution) (INDEPENDENT_AMBULATORY_CARE_PROVIDER_SITE_OTHER): Payer: 59 | Admitting: Surgery

## 2023-03-15 VITALS — BP 125/75 | HR 70 | Resp 81 | Ht 70.0 in | Wt 347.0 lb

## 2023-03-15 DIAGNOSIS — Z95810 Presence of automatic (implantable) cardiac defibrillator: Secondary | ICD-10-CM

## 2023-03-15 NOTE — Progress Notes (Unsigned)
 PCP is Georgann Housekeeper, MD Referring Provider is Sheilah Pigeon, PA-C  Chief Complaint  Patient presents with  . Consult    HPI:   Past Medical History:  Diagnosis Date  . Asthma   . Automatic implantable cardioverter-defibrillator in situ   . Back pain   . Chronic systolic CHF (congestive heart failure), NYHA class 3 (HCC)   . Complete heart block [facility])    s/p PPM 1997 with upgrade to AICD 2011  . Depression   . Dilated aortic root (HCC)    40mm ascending aortic root  . DM (diabetes mellitus) (HCC)   . Glaucoma   . HTN (hypertension)   . Hyperlipidemia   . Nonischemic cardiomyopathy (HCC)    EF of 15-20% by echo 2015 and 20% by nuclear stress test 2017  . Obesity   . PAF (paroxysmal atrial fibrillation) (HCC)    13 minutes of PAF documented on ICD check 2018 - anticoagulated with Eliquis  . Sarcoid   . Sleep apnea 06-28-12   no cpap used-unable to tolerate  . SVT (supraventricular tachycardia) (HCC)    s/p ablation    Past Surgical History:  Procedure Laterality Date  . COLONOSCOPY WITH PROPOFOL N/A 07/17/2012   Procedure: COLONOSCOPY WITH PROPOFOL;  Surgeon: Charolett Bumpers, MD;  Location: [facility];  Service: Endoscopy;  Laterality: N/A;  . HEMORRHOID SURGERY     early 20's  . ICD GENERATOR CHANGEOUT N/A 05/28/2018   Procedure: ICD GENERATOR CHANGEOUT;  Surgeon: Marinus Maw, MD;  Location: [facility] INVASIVE CV LAB;  Service: Cardiovascular;  Laterality: N/A;  . IR US GUIDE VASC ACCESS LEFT  12/21/2021  . IR VENO/EXT/UNI LEFT  12/21/2021  . PACEMAKER INSERTION    . RIGHT/LEFT HEART CATH AND CORONARY ANGIOGRAPHY N/A 04/14/2021   Procedure: RIGHT/LEFT HEART CATH AND CORONARY ANGIOGRAPHY;  Surgeon: Dolores Patty, MD;  Location: [facility] INVASIVE CV LAB;  Service: Cardiovascular;  Laterality: N/A;  . TONSILLECTOMY      Family History  Problem Relation Age of Onset  . Diabetes Mother   . Alzheimer's disease Mother   . CVA Father   . Heart failure Father    . Epilepsy Sister   . Diabetes Brother   . Diabetes Sister     Social History Social History   Tobacco Use  . Smoking status: Never  . Smokeless tobacco: Never  Vaping Use  . Vaping status: Never Used  Substance Use Topics  . Alcohol use: Not Currently    Comment: occasionally  . Drug use: No    Current Outpatient Medications  Medication Sig Dispense Refill  . Aspirin-Caffeine (BC FAST PAIN RELIEF PO) Take 1 packet by mouth daily as needed (pain).    Marland Kitchen ENTRESTO 97-103 MG TAKE 1 TABLET BY MOUTH 2 (TWO) TIMES DAILY. NEEDS FOLLOW UP APPOINTMENT FOR MORE REFILLS 180 tablet 0  . furosemide (LASIX) 40 MG tablet Take 1 tablet (40 mg total) by mouth daily. 90 tablet 3  . JARDIANCE 25 MG TABS tablet Take 25 mg by mouth daily.    Marland Kitchen KLOR-CON M20 20 MEQ tablet TAKE 1 TABLET (20 MEQ TOTAL) BY MOUTH AS NEEDED. WITH LASIX DOSE ONLY 90 tablet 2  . MAGNESIUM PO Take 200 mg by mouth every other day.    . metoprolol succinate (TOPROL-XL) 50 MG 24 hr tablet TAKE 1 TABLET BY MOUTH DAILY WITH OR IMMEDIATELY FOLLOWING A MEAL 90 tablet 3  . MOUNJARO 2.5 MG/0.5ML Pen Inject 12.5 mg into  the skin every Friday.    . RESTASIS 0.05 % ophthalmic emulsion Place 1 drop into both eyes 2 (two) times daily as needed (dry eyes).    . rivaroxaban (XARELTO) 20 MG TABS tablet Take 1 tablet (20 mg total) by mouth daily with supper. 30 tablet 11  . rosuvastatin (CRESTOR) 20 MG tablet Take 20 mg by mouth daily.    Marland Kitchen spironolactone (ALDACTONE) 25 MG tablet TAKE 1 TABLET (25 MG TOTAL) BY MOUTH DAILY. 90 tablet 3  . thiamine (VITAMIN B-1) 50 MG tablet Take 50 mg by mouth daily.    Marland Kitchen tiZANidine (ZANAFLEX) 4 MG capsule Take 4 mg by mouth at bedtime as needed for muscle spasms.    . TRESIBA FLEXTOUCH 200 UNIT/ML SOPN Inject 40 Units into the skin every morning.    . zinc gluconate 50 MG tablet Take 50 mg by mouth every other day.     No current facility-administered medications for this visit.   Facility-Administered  Medications Ordered in Other Visits  Medication Dose Route Frequency Provider Last Rate Last Admin  . technetium pyrophosphate Tc 57m injection 21.1 millicurie  21.1 millicurie Intravenous Once Lars Masson, MD        Allergies  Allergen Reactions  . Penicillins Other (See Comments)    Dizziness Did it involve swelling of the face/tongue/throat, SOB, or low BP? Yes Did it involve sudden or severe rash/hives, skin peeling, or any reaction on the inside of your mouth or nose? No Did you need to seek medical attention at a hospital or doctor's office? Yes When did it last happen?      childhood If all above answers are "NO", may proceed with cephalosporin use.     Review of Systems  BP 125/75   Pulse 70   Resp (!) 81   Ht 5\' 10"  (1.778 m)   Wt (!) 347 lb (157.4 kg)   SpO2 92% Comment: RA  BMI 49.79 kg/m  Physical Exam   Diagnostic Tests:   Impression:   Plan:   Alleen Borne, MD [facility] [phone]

## 2023-03-24 DIAGNOSIS — Z1389 Encounter for screening for other disorder: Secondary | ICD-10-CM | POA: Diagnosis not present

## 2023-03-24 DIAGNOSIS — E785 Hyperlipidemia, unspecified: Secondary | ICD-10-CM | POA: Diagnosis not present

## 2023-03-24 DIAGNOSIS — E1151 Type 2 diabetes mellitus with diabetic peripheral angiopathy without gangrene: Secondary | ICD-10-CM | POA: Diagnosis not present

## 2023-03-24 DIAGNOSIS — I2510 Atherosclerotic heart disease of native coronary artery without angina pectoris: Secondary | ICD-10-CM | POA: Diagnosis not present

## 2023-03-24 DIAGNOSIS — J479 Bronchiectasis, uncomplicated: Secondary | ICD-10-CM | POA: Diagnosis not present

## 2023-03-24 DIAGNOSIS — Z0001 Encounter for general adult medical examination with abnormal findings: Secondary | ICD-10-CM | POA: Diagnosis not present

## 2023-03-24 DIAGNOSIS — I110 Hypertensive heart disease with heart failure: Secondary | ICD-10-CM | POA: Diagnosis not present

## 2023-03-24 DIAGNOSIS — I700 Atherosclerosis of aorta: Secondary | ICD-10-CM | POA: Diagnosis not present

## 2023-03-28 ENCOUNTER — Ambulatory Visit: Payer: 59 | Attending: Internal Medicine | Admitting: Internal Medicine

## 2023-03-28 ENCOUNTER — Encounter: Payer: Self-pay | Admitting: Internal Medicine

## 2023-03-28 VITALS — BP 112/78 | HR 92 | Ht 70.0 in | Wt 347.0 lb

## 2023-03-28 DIAGNOSIS — Z95810 Presence of automatic (implantable) cardiac defibrillator: Secondary | ICD-10-CM

## 2023-03-28 DIAGNOSIS — I428 Other cardiomyopathies: Secondary | ICD-10-CM

## 2023-03-28 DIAGNOSIS — I5022 Chronic systolic (congestive) heart failure: Secondary | ICD-10-CM

## 2023-03-28 NOTE — Progress Notes (Signed)
 Remote ICD transmission.

## 2023-03-28 NOTE — Progress Notes (Signed)
 HPI [name] returns today for followup. He is a morbidly obese 68 yo man with CHB, s/p ICD insertion, non-ischemic CM, and HTN. His weight is unchanged. He has gone back to the gym. He denies peripheral edema. He denies chest pain and his dyspnea remains class 2, despite his massive obesity.  He has seen Dr. Laneta Simmers who thinks he is a candidate for sternotomy if needed if he decided to pursue biv upgrade with a lead extraction. Allergies  Allergen Reactions   Penicillins Other (See Comments)    Dizziness Did it involve swelling of the face/tongue/throat, SOB, or low BP? Yes Did it involve sudden or severe rash/hives, skin peeling, or any reaction on the inside of your mouth or nose? No Did you need to seek medical attention at a hospital or doctor's office? Yes When did it last happen?      childhood If all above answers are "NO", may proceed with cephalosporin use.      Current Outpatient Medications  Medication Sig Dispense Refill   Aspirin-Caffeine (BC FAST PAIN RELIEF PO) Take 1 packet by mouth daily as needed (pain).     ENTRESTO 97-103 MG TAKE 1 TABLET BY MOUTH 2 (TWO) TIMES DAILY. NEEDS FOLLOW UP APPOINTMENT FOR MORE REFILLS 180 tablet 0   furosemide (LASIX) 40 MG tablet Take 1 tablet (40 mg total) by mouth daily. 90 tablet 3   JARDIANCE 25 MG TABS tablet Take 25 mg by mouth daily.     KLOR-CON M20 20 MEQ tablet TAKE 1 TABLET (20 MEQ TOTAL) BY MOUTH AS NEEDED. WITH LASIX DOSE ONLY 90 tablet 2   MAGNESIUM PO Take 200 mg by mouth every other day.     metoprolol succinate (TOPROL-XL) 50 MG 24 hr tablet TAKE 1 TABLET BY MOUTH DAILY WITH OR IMMEDIATELY FOLLOWING A MEAL 90 tablet 3   MOUNJARO 2.5 MG/0.5ML Pen Inject 12.5 mg into the skin every Friday.     RESTASIS 0.05 % ophthalmic emulsion Place 1 drop into both eyes 2 (two) times daily as needed (dry eyes).     rivaroxaban (XARELTO) 20 MG TABS tablet Take 1 tablet (20 mg total) by mouth daily with supper. 30 tablet 11    rosuvastatin (CRESTOR) 20 MG tablet Take 20 mg by mouth daily.     spironolactone (ALDACTONE) 25 MG tablet TAKE 1 TABLET (25 MG TOTAL) BY MOUTH DAILY. 90 tablet 3   thiamine (VITAMIN B-1) 50 MG tablet Take 50 mg by mouth daily.     tiZANidine (ZANAFLEX) 4 MG capsule Take 4 mg by mouth at bedtime as needed for muscle spasms.     TRESIBA FLEXTOUCH 200 UNIT/ML SOPN Inject 40 Units into the skin every morning.     Vitamin D, Ergocalciferol, (DRISDOL) 1.25 MG (50000 UNIT) CAPS capsule Take 50,000 Units by mouth once a week.     zinc gluconate 50 MG tablet Take 50 mg by mouth every other day.     No current facility-administered medications for this visit.   Facility-Administered Medications Ordered in Other Visits  Medication Dose Route Frequency Provider Last Rate Last Admin   technetium pyrophosphate Tc 68m injection 21.1 millicurie  21.1 millicurie Intravenous Once Lars Masson, MD         Past Medical History:  Diagnosis Date   Asthma    Automatic implantable cardioverter-defibrillator in situ    Back pain    Chronic systolic CHF (congestive heart failure), NYHA class 3 (HCC)  Complete heart block (HCC)    s/p PPM 1997 with upgrade to AICD 2011   Depression    Dilated aortic root (HCC)    40mm ascending aortic root   DM (diabetes mellitus) (HCC)    Glaucoma    HTN (hypertension)    Hyperlipidemia    Nonischemic cardiomyopathy (HCC)    EF of 15-20% by echo 2015 and 20% by nuclear stress test 2017   Obesity    PAF (paroxysmal atrial fibrillation) (HCC)    13 minutes of PAF documented on ICD check 2018 - anticoagulated with Eliquis   Sarcoid    Sleep apnea 06-28-12   no cpap used-unable to tolerate   SVT (supraventricular tachycardia) (HCC)    s/p ablation    ROS:   All systems reviewed and negative except as noted in the HPI.   Past Surgical History:  Procedure Laterality Date   COLONOSCOPY WITH PROPOFOL N/A 07/17/2012   Procedure: COLONOSCOPY WITH PROPOFOL;   Surgeon: Charolett Bumpers, MD;  Location: [facility];  Service: Endoscopy;  Laterality: N/A;   HEMORRHOID SURGERY     early 20's   ICD GENERATOR CHANGEOUT N/A 05/28/2018   Procedure: ICD GENERATOR CHANGEOUT;  Surgeon: Marinus Maw, MD;  [facility] INVASIVE CV LAB;  Service: Cardiovascular;  Laterality: N/A;   IR US GUIDE VASC ACCESS LEFT  12/21/2021   IR VENO/EXT/UNI LEFT  12/21/2021   PACEMAKER INSERTION     RIGHT/LEFT HEART CATH AND CORONARY ANGIOGRAPHY N/A 04/14/2021   Procedure: RIGHT/LEFT HEART CATH AND CORONARY ANGIOGRAPHY;  Surgeon: Dolores Patty, MD;  Location: [facility] INVASIVE CV LAB;  Service: Cardiovascular;  Laterality: N/A;   TONSILLECTOMY       Family History  Problem Relation Age of Onset   Diabetes Mother    Alzheimer's disease Mother    CVA Father    Heart failure Father    Epilepsy Sister    Diabetes Brother    Diabetes Sister      Social History   Socioeconomic History   Marital status: Legally Separated    Spouse name: Not on file   Number of children: 2   Years of education: GED   Highest education level: Not on file  Occupational History   Not on file  Tobacco Use   Smoking status: Never   Smokeless tobacco: Never  Vaping Use   Vaping status: Never Used  Substance and Sexual Activity   Alcohol use: Not Currently    Comment: occasionally   Drug use: No   Sexual activity: Yes  Other Topics Concern   Not on file  Social History Narrative   Not on file   Social Drivers of Health   Financial Resource Strain: Low Risk  (03/25/2021)   Overall Financial Resource Strain (CARDIA)    Difficulty of Paying Living Expenses: Not very hard  Food Insecurity: No Food Insecurity (03/25/2021)   Hunger Vital Sign    Worried About Running Out of Food in the Last Year: Never true    Ran Out of Food in the Last Year: Never true  Transportation Needs: No Transportation Needs (03/25/2021)   PRAPARE - Administrator, Civil Service (Medical): No     Lack of Transportation (Non-Medical): No  Physical Activity: Not on file  Stress: Not on file  Social Connections: Not on file  Intimate Partner Violence: Not on file     BP 112/78   Pulse 92   Ht 5\' 10"  (1.778 m)  Wt (!) 347 lb (157.4 kg)   SpO2 96%   BMI 49.79 kg/m   Physical Exam:  Well appearing NAD HEENT: Unremarkable Neck:  No JVD, no thyromegally Lymphatics:  No adenopathy Back:  No CVA tenderness Lungs:  Clear with no wheezes HEART:  Regular rate rhythm, no murmurs, no rubs, no clicks Abd:  soft, positive bowel sounds, no organomegally, no rebound, no guarding Ext:  2 plus pulses, no edema, no cyanosis, no clubbing Skin:  No rashes no nodules Neuro:  CN II through XII intact, motor grossly intact  DEVICE  Normal device function.  See PaceArt for details.   Assess/Plan:  Chronic systolic heart failure - I have discussed the treatment options with the patient. He has an indication for biv ICD upgrade. However we know his SCV was occluded in 2011. I have offered him lead extraction and insertion of a LV lead but he feels well and is not interested at this time.  Morbid obesity - he has been placed on mounjaro and I think weight loss would really help him.  HTN - his bp is controlled. No change in his meds. Dyslipdemia - should improve with weight loss.    Jacob Gowda Zala Degrasse,MD

## 2023-03-28 NOTE — Patient Instructions (Signed)

## 2023-03-28 NOTE — Addendum Note (Signed)
 Addended by: Elease Etienne A on: 03/28/2023 09:25 AM   Modules accepted: Orders

## 2023-04-05 LAB — CUP PACEART INCLINIC DEVICE CHECK
Date Time Interrogation Session: 20250318131113
Implantable Lead Connection Status: 753985
Implantable Lead Connection Status: 753985
Implantable Lead Implant Date: 19961213
Implantable Lead Implant Date: 20111202
Implantable Lead Location: 753859
Implantable Lead Location: 753860
Implantable Lead Model: 5524
Implantable Lead Model: 7121
Implantable Pulse Generator Implant Date: 20200518
Pulse Gen Serial Number: 9829382

## 2023-04-06 DIAGNOSIS — H905 Unspecified sensorineural hearing loss: Secondary | ICD-10-CM | POA: Diagnosis not present

## 2023-05-09 ENCOUNTER — Telehealth (HOSPITAL_COMMUNITY): Payer: Self-pay | Admitting: *Deleted

## 2023-05-09 NOTE — Telephone Encounter (Signed)
 Called to confirm/remind patient of their appointment [facility] on 04/14/23***.   Appointment:   [x] Confirmed  [] Left mess   [] No answer/No voice mail  [] Phone not in service  Patient reminded to bring all medications and/or complete list.  Confirmed patient has transportation. Gave directions, instructed to utilize valet parking.

## 2023-05-10 ENCOUNTER — Ambulatory Visit (HOSPITAL_COMMUNITY)
Admission: RE | Admit: 2023-05-10 | Discharge: 2023-05-10 | Disposition: A | Discharge status: Home / Self Care | Payer: 59 | Source: Ambulatory Visit | Attending: Family Medicine | Admitting: Family Medicine

## 2023-05-10 ENCOUNTER — Encounter (HOSPITAL_COMMUNITY): Payer: Self-pay | Admitting: Gastroenterology

## 2023-05-10 ENCOUNTER — Encounter (HOSPITAL_COMMUNITY): Payer: Self-pay

## 2023-05-10 VITALS — BP 108/72 | HR 75 | Wt 338.6 lb

## 2023-05-10 DIAGNOSIS — I471 Supraventricular tachycardia, unspecified: Secondary | ICD-10-CM | POA: Insufficient documentation

## 2023-05-10 DIAGNOSIS — Z794 Long term (current) use of insulin: Secondary | ICD-10-CM | POA: Insufficient documentation

## 2023-05-10 DIAGNOSIS — I428 Other cardiomyopathies: Secondary | ICD-10-CM | POA: Diagnosis not present

## 2023-05-10 DIAGNOSIS — I110 Hypertensive heart disease with heart failure: Secondary | ICD-10-CM | POA: Diagnosis not present

## 2023-05-10 DIAGNOSIS — E119 Type 2 diabetes mellitus without complications: Secondary | ICD-10-CM | POA: Insufficient documentation

## 2023-05-10 DIAGNOSIS — I480 Paroxysmal atrial fibrillation: Secondary | ICD-10-CM

## 2023-05-10 DIAGNOSIS — E669 Obesity, unspecified: Secondary | ICD-10-CM | POA: Diagnosis not present

## 2023-05-10 DIAGNOSIS — D860 Sarcoidosis of lung: Secondary | ICD-10-CM | POA: Insufficient documentation

## 2023-05-10 DIAGNOSIS — I10 Essential (primary) hypertension: Secondary | ICD-10-CM | POA: Diagnosis not present

## 2023-05-10 DIAGNOSIS — Z7901 Long term (current) use of anticoagulants: Secondary | ICD-10-CM | POA: Insufficient documentation

## 2023-05-10 DIAGNOSIS — I442 Atrioventricular block, complete: Secondary | ICD-10-CM | POA: Diagnosis not present

## 2023-05-10 DIAGNOSIS — Z7984 Long term (current) use of oral hypoglycemic drugs: Secondary | ICD-10-CM | POA: Diagnosis not present

## 2023-05-10 DIAGNOSIS — G4733 Obstructive sleep apnea (adult) (pediatric): Secondary | ICD-10-CM | POA: Insufficient documentation

## 2023-05-10 DIAGNOSIS — Z95810 Presence of automatic (implantable) cardiac defibrillator: Secondary | ICD-10-CM | POA: Diagnosis not present

## 2023-05-10 DIAGNOSIS — I5022 Chronic systolic (congestive) heart failure: Secondary | ICD-10-CM | POA: Insufficient documentation

## 2023-05-10 DIAGNOSIS — Z6841 Body Mass Index (BMI) 40.0 and over, adult: Secondary | ICD-10-CM | POA: Insufficient documentation

## 2023-05-10 LAB — BASIC METABOLIC PANEL WITH GFR
Anion gap: 7 (ref 5–15)
BUN: 9 mg/dL (ref 8–23)
CO2: 24 mmol/L (ref 22–32)
Calcium: 10.7 mg/dL — ABNORMAL HIGH (ref 8.9–10.3)
Chloride: 107 mmol/L (ref 98–111)
Creatinine, Ser: 0.94 mg/dL (ref 0.61–1.24)
GFR, Estimated: >60 mL/min (ref >60)
Glucose, Bld: 146 mg/dL — ABNORMAL HIGH (ref 70–99)
Potassium: 4.3 mmol/L (ref 3.5–5.1)
Sodium: 138 mmol/L (ref 135–145)

## 2023-05-10 LAB — CBC
HCT: 42.4 % (ref 39.0–52.0)
Hemoglobin: 14 g/dL (ref 13.0–17.0)
MCH: 28.7 pg (ref 26.0–34.0)
MCHC: 33 g/dL (ref 30.0–36.0)
MCV: 86.9 fL (ref 80.0–100.0)
Platelets: 274 10*3/uL (ref 150–400)
RBC: 4.88 MIL/uL (ref 4.22–5.81)
RDW: 12.1 % (ref 11.5–15.5)
WBC: 6.5 10*3/uL (ref 4.0–10.5)
nRBC: 0 % (ref 0.0–0.2)

## 2023-05-10 LAB — IRON AND TIBC
Iron: 114 ug/dL (ref 45–182)
Saturation Ratios: 30 % (ref 17.9–39.5)
TIBC: 386 ug/dL (ref 250–450)
UIBC: 272 ug/dL

## 2023-05-10 LAB — FERRITIN: Ferritin: 16 ng/mL — ABNORMAL LOW (ref 24–336)

## 2023-05-10 MED ORDER — ENTRESTO 97-103 MG PO TABS: 1.0000 | ORAL_TABLET | Freq: Two times a day (BID) | ORAL | 3 refills | Status: AC

## 2023-05-10 NOTE — Progress Notes (Signed)
 [facility] Note  Primary Care: Carolyn Cisco, NP Primary Cardiologist: Gaylyn Keas, MD HF Cardiologist: Dr. Julane Ny  HPI: [name] is a 68 y.o. male with a obesity, systolic HF due to NICM (normal coronary arteries by cath 1996), pulmonary sarcoidosis (biopsy proven), DM2, HTN,  OSA (intolerant of CPAP), CHB s/p dual chamber AICD and SVT s/p ablation.  Referred by Dr. Micael Adas for further evaluation of his HF.   Diagnosed with pulmonary sarcoid in 1992 or 1993 (biopsy proven). Treated with steroids for several months. Told it was burned out.  Had SVT ablation c/b CHB and underwent PPM placement   Cath in 1996. Normal coronary arteries.    Myoview  2011 EF 28% large inferior scar Echo 2013 EF 35-40%  Echo 2015 15-20%  Myoview  2017. EF 20% large scar Echo 2019 EF 15-20% Echo 1/23 EF 15-20%   St. Jude BiV ICD implanted 1996, upgrade 2011, gen change 2020 for CHF (LV port capped). He has previously been offered CRT upgrade and has declined  Follow up 3/23. Arranged L/RHC and sleep study. R/LHC (4/23) showed normal cors, EF 20%, well compensated hemodynamics except for reduced PAPi.  Follow up 9/23, RV pacing 98%, referred to EP. Follow up 12/23 with EP to discuss CRT-D upgrade, unfortunately found to have occluded L SCV vein and unable to undergo BiV upgrade.  Echo 11/10/22: EF 20-25% dilated severe dysynchrony due to RV pacing   Today he returns for HF follow up. Overall feeling fine. Does total body gym at home without SOB. Denies palpitations, abnormal bleeding, CP, dizziness, edema, or PND/Orthopnea. Appetite ok. No fever or chills. Weight at home 334 pounds. Taking all medications.   Cardiac Studies: - Echo 10/24: EF 20-25%, dilated severe dyssynchrony due to RV pacing  - Sleep study (6/23): revealed moderate sleep apnea.   - R/LHC (4/23): normal cors, Relatively well compensated hemodynamics except for reduced PAPi   The left ventricular  ejection fraction is less than 25% by visual estimate. Ao = 107/62 (80) LV = 103/17 RA = 12 RV = 36/12 PA = 35/17 (24) PCW = 13 Fick cardiac output/index = 7.9/3.0 PVR = 1.4 WU SVR = 665  FA sat = 98% PA sat = 71%, 74% PAPi = 1.5  - Echo 1/23 EF 15-20%  - Echo 2019 EF 15-20% - Myoview  2017. EF 20% large scar - Echo 2015 15-20%  - Echo 2013 EF 35-40%  - Myoview  2011 EF 28% large inferior scar  Past Medical History:  Diagnosis Date   Asthma    Automatic implantable cardioverter-defibrillator in situ    Back pain    Chronic systolic CHF (congestive heart failure), NYHA class 3 (HCC)    Complete heart block (HCC)    s/p PPM 1997 with upgrade to AICD 2011   Depression    Dilated aortic root (HCC)    40mm ascending aortic root   DM (diabetes mellitus) (HCC)    Glaucoma    HTN (hypertension)    Hyperlipidemia    Nonischemic cardiomyopathy (HCC)    EF of 15-20% by echo 2015 and 20% by nuclear stress test 2017   Obesity    PAF (paroxysmal atrial fibrillation) (HCC)    13 minutes of PAF documented on ICD check 2018 - anticoagulated with Eliquis   Sarcoid    Sleep apnea 06-28-12   no cpap used-unable to tolerate   SVT (supraventricular tachycardia) (HCC)    s/p ablation   Current Outpatient Medications  Medication Sig Dispense Refill   Aspirin -Caffeine (BC FAST PAIN RELIEF PO) Take 1 packet by mouth daily as needed (pain).     ENTRESTO  97-103 MG TAKE 1 TABLET BY MOUTH 2 (TWO) TIMES DAILY. NEEDS FOLLOW UP APPOINTMENT FOR MORE REFILLS 180 tablet 0   furosemide  (LASIX ) 40 MG tablet Take 1 tablet (40 mg total) by mouth daily. 90 tablet 3   JARDIANCE 25 MG TABS tablet Take 25 mg by mouth daily.     KLOR-CON  M20 20 MEQ tablet TAKE 1 TABLET (20 MEQ TOTAL) BY MOUTH AS NEEDED. WITH LASIX  DOSE ONLY 90 tablet 2   MAGNESIUM PO Take 200 mg by mouth every other day.     metoprolol  succinate (TOPROL -XL) 50 MG 24 hr tablet TAKE 1 TABLET BY MOUTH DAILY WITH OR IMMEDIATELY FOLLOWING A MEAL  90 tablet 3   RESTASIS 0.05 % ophthalmic emulsion Place 1 drop into both eyes 2 (two) times daily as needed (dry eyes).     rivaroxaban  (XARELTO ) 20 MG TABS tablet Take 1 tablet (20 mg total) by mouth daily with supper. 30 tablet 11   rosuvastatin (CRESTOR) 20 MG tablet Take 20 mg by mouth daily.     spironolactone  (ALDACTONE ) 25 MG tablet TAKE 1 TABLET (25 MG TOTAL) BY MOUTH DAILY. 90 tablet 3   thiamine (VITAMIN B-1) 50 MG tablet Take 50 mg by mouth daily. As needed     tiZANidine (ZANAFLEX) 4 MG capsule Take 4 mg by mouth at bedtime as needed for muscle spasms.     TRESIBA FLEXTOUCH 200 UNIT/ML SOPN Inject 40 Units into the skin every morning.     Vitamin D, Ergocalciferol, (DRISDOL) 1.25 MG (50000 UNIT) CAPS capsule Take 50,000 Units by mouth once a week. Takes on Sundays     zinc gluconate 50 MG tablet Take 50 mg by mouth every other day.     No current facility-administered medications for this encounter.   Facility-Administered Medications Ordered in Other Encounters  Medication Dose Route Frequency Provider Last Rate Last Admin   technetium pyrophosphate Tc 34m injection 21.1 millicurie  21.1 millicurie Intravenous Once Liza Riggers, MD       Allergies  Allergen Reactions   Penicillins Other (See Comments)    Dizziness Did it involve swelling of the face/tongue/throat, SOB, or low BP? Yes Did it involve sudden or severe rash/hives, skin peeling, or any reaction on the inside of your mouth or nose? No Did you need to seek medical attention at a hospital or doctor's office? Yes When did it last happen?      childhood If all above answers are "NO", may proceed with cephalosporin use.    Social History   Socioeconomic History   Marital status: Legally Separated    Spouse name: Not on file   Number of children: 2   Years of education: GED   Highest education level: Not on file  Occupational History   Not on file  Tobacco Use   Smoking status: Never   Smokeless  tobacco: Never  Vaping Use   Vaping status: Never Used  Substance and Sexual Activity   Alcohol use: Not Currently    Comment: occasionally   Drug use: No   Sexual activity: Yes  Other Topics Concern   Not on file  Social History Narrative   Not on file   Social Drivers of Health   Financial Resource Strain: Low Risk  (03/25/2021)   Overall Financial Resource Strain (CARDIA)    Difficulty  of Paying Living Expenses: Not very hard  Food Insecurity: No Food Insecurity (03/25/2021)   Hunger Vital Sign    Worried About Running Out of Food in the Last Year: Never true    Ran Out of Food in the Last Year: Never true  Transportation Needs: No Transportation Needs (03/25/2021)   PRAPARE - Administrator, Civil Service (Medical): No    Lack of Transportation (Non-Medical): No  Physical Activity: Not on file  Stress: Not on file  Social Connections: Not on file  Intimate Partner Violence: Not on file   Family History  Problem Relation Age of Onset   Diabetes Mother    Alzheimer's disease Mother    CVA Father    Heart failure Father    Epilepsy Sister    Diabetes Brother    Diabetes Sister    Wt Readings from Last 3 Encounters:  05/10/23 (!) 153.6 kg (338 lb 9.6 oz)  03/28/23 (!) 157.4 kg (347 lb)  03/15/23 (!) 157.4 kg (347 lb)   BP 108/72   Pulse 75   Wt (!) 153.6 kg (338 lb 9.6 oz)   SpO2 95%   BMI 48.58 kg/m   PHYSICAL EXAM: General:  NAD. No resp difficulty, walked into clinic HEENT: Normal Neck: Supple. No JVD. Thick neck Cor: Regular rate & rhythm. No rubs, gallops or murmurs. Lungs: Clear Abdomen: Soft, obese, nontender, nondistended.  Extremities: No cyanosis, clubbing, rash, edema Neuro: Alert & oriented x 3, moves all 4 extremities w/o difficulty. Affect pleasant.  Device interrogation (personally reviewed): CorVue stable, 98% RVP, no AT/AF, no VT  ASSESSMENT & PLAN: 1.  Chronic systolic CHF  - longstanding LV dysfunction. Etiology remains  unclear - Last cath 1996 with normal coronaries however subsequent Myoview  have suggested possible underlying iHD - which I would suspect given his CRFs. Also has chronic RV pacing - s/p SJ BiV ICD (LV lead capped) - has refused upgrade to CRT - PYP scan 4/20 equivocal for amyloid. Urine and protein electrophoresis were normal. - Echo (1/23): EF 20% RV ok  - R/LHC (4/23) normal cors, EF 20% NICM, well compensated hemodynamics, except low PAPi; RA 12, PA 35/17 (24), PCW 13, CO/CI (Fick) 7.9/3.0, PAPi 1.5 - Doubt cardiac sarcoid, with wide QRS, suspect RV pacing CM (see below) - Echo 10/24: EF 20-25% dilated severe dysynchrony due to RV pacing  - Stable NYHA II. Volume ok on exam, and device interrogation - Continue Entresto  97-103 mg bid. - Continue Toprol  XL 50 mg daily. - Continue spiro 25 mg daily. - Continue Jardiance 25 mg daily. - Continue Lasix  40 mg daily. OK to take extra 40 mg PRN/20 KCL PRN. - Labs today. - He has persistent, severe LV dysfunction. Echo suggestive of LBBB cardiomyopathy. Saw Dr Sherene Dilling who felt he is a candidate for re-do sternotomy if needed to pursue BiV upgrade with lead extraction. He saw Dr. Carolynne Citron and patient ultimately decided against it  2. HTN  - Blood pressure well controlled.  - Continue current regimen.  3. CHB   - Resulted from SVT ablation - he has STJ BiV ICD in place but LV lead capped. Has refused CRT upgrade in the past.  - Saw Dr Carolynne Citron regarding CRT-D, but L SCV occluded. Extraction and BiV upgrade increased risk. See discussion above. EF still markedly depressed.    4. PAF  - he was noted on ICD check in 2018 to have PAF. - Regular on exam today, no AF on device  interrogation today - Continue Xarelto . - CBC and iron panel today.  5. OSA - moderate by sleep study 6/23  - Unable to tolerate CPAP - Follows with Dr. Micael Adas. - No change  6. Obesity - Body mass index is 48.58 kg/m. - Stopped Mounjaro 2/2 to constipation - Down 10 lbs,  continue weight loss efforts  Follow up in 6 months with Dr. Bensimhon  Deniah Saia M Belkys Henault, FNP  1:47 PM

## 2023-05-10 NOTE — Patient Instructions (Signed)
 Medication Changes:  No Changes In Medications at this time.   Lab Work:  Labs done today, your results will be available in MyChart, we will contact you for abnormal readings.  Follow-Up in: 6 MONTHS WITH DR. Julane Ny PLEASE CALL OUR OFFICE AROUND SEPTEMBER TO GET SCHEDULED FOR YOUR APPOINTMENT. PHONE NUMBER IS [phone] OPTION 2   At the [facility], you and your health needs are our priority. We have a designated team specialized in the treatment of Heart Failure. This Care Team includes your primary Heart Failure Specialized Cardiologist (physician), Advanced Practice Providers (APPs- Physician Assistants and Nurse Practitioners), and Pharmacist who all work together to provide you with the care you need, when you need it.   You may see any of the following providers on your designated Care Team at your next follow up:  Dr. Jules Oar Dr. Peder Bourdon Dr. Alwin Baars Dr. Judyth Nunnery Nieves Bars, NP Ruddy Corral, [place] [facility] [place], [place] Dennise Fitz, NP [place] Lee, NP Luster Salters, PharmD   Please be sure to bring in all your medications bottles to every appointment.   Need to Contact Us :  If you have any questions or concerns before your next appointment please send us  a message through Export or call our office at [phone].    TO LEAVE A MESSAGE FOR THE NURSE SELECT OPTION 2, PLEASE LEAVE A MESSAGE INCLUDING: YOUR NAME DATE OF BIRTH CALL BACK NUMBER REASON FOR CALL**this is important as we prioritize the call backs  YOU WILL RECEIVE A CALL BACK THE SAME DAY AS LONG AS YOU CALL BEFORE 4:00 PM

## 2023-05-12 NOTE — Telephone Encounter (Signed)
 I will forward to preop APP for review if the pt has been cleared.

## 2023-05-12 NOTE — Telephone Encounter (Signed)
 Office is calling stating they haven't received a clearance. Please advise

## 2023-05-15 ENCOUNTER — Telehealth (HOSPITAL_BASED_OUTPATIENT_CLINIC_OR_DEPARTMENT_OTHER): Payer: Self-pay | Admitting: *Deleted

## 2023-05-15 ENCOUNTER — Encounter: Payer: Self-pay | Admitting: Internal Medicine

## 2023-05-15 ENCOUNTER — Telehealth: Payer: Self-pay | Admitting: Cardiology

## 2023-05-15 NOTE — Telephone Encounter (Signed)
 Caller (Tiffany) called to follow-up on the status of patient's clearance faxed last week.  Caller noted patient will need to hold his Xarelto .

## 2023-05-15 NOTE — Telephone Encounter (Signed)
 I called Tiffany at [facility] and informed her that I do not see another fax request for procedure bedsides the one from 12/2022. Tiffany will re-fax now.   I will update the preop APP as soon as the new request comes in.

## 2023-05-15 NOTE — Progress Notes (Signed)
 PERIOPERATIVE PRESCRIPTION FOR IMPLANTED CARDIAC DEVICE PROGRAMMING  Patient Information: Name:  [name]  DOB:  04/10/1955  MRN:  [id number]  Procedure:  Colonoscopy/endoscopy   Date of Surgery:  Clearance 05/17/23                                  Surgeon:  Dr. Evangeline Hilts Surgeon's Group or Practice Name:  [facility] Phone number:  [phone] Fax number:  [phone]   Type of Clearance Requested:   - Medical  - Pharmacy:  Hold Rivaroxaban  (Xarelto ) Not Indicated.   Type of Anesthesia:   Propofol  Device Information:  Clinic EP Physician:  Manya Sells, MD   Device Type:  Defibrillator Manufacturer and Phone #:  St. Jude/A[employer]ott: [phone] Pacemaker Dependent?:  Yes.   Date of Last Device Check:  03/28/2023 Normal Device Function?:  Yes.    Electrophysiologist's Recommendations:  Have magnet available. Provide continuous ECG monitoring when magnet is used or reprogramming is to be performed.  Procedure should not interfere with device function.  No device programming or magnet placement needed.  If prolonged electrocautery will be used a magnet should be placed over device to prevent inhibition of pacing.  Per Device Clinic Standing Orders, Forrestine Ike, RN  4:09 PM 05/15/2023

## 2023-05-15 NOTE — Telephone Encounter (Signed)
   Pre-operative Risk Assessment    Patient Name: [name]  DOB: 02/22/1955 MRN: [id number]   Date of last office visit: 03/28/2023 sent request to device.  Date of next office visit: None  Request for Surgical Clearance    Procedure:  Colonoscopy/endoscopy  Date of Surgery:  Clearance 05/17/23                                 Surgeon:  Dr. Evangeline Hilts Surgeon's Group or Practice Name:  [facility] Phone number:  [phone] Fax number:  [phone]   Type of Clearance Requested:   - Medical  - Pharmacy:  Hold Rivaroxaban  (Xarelto ) Not Indicated.   Type of Anesthesia:   Propofol    Additional requests/questions:    Signed, Lauris Port   05/15/2023, 3:53 PM

## 2023-05-15 NOTE — Telephone Encounter (Signed)
 Patient's chart was reviewed for preoperative cardiac evaluation for upcoming endoscopy/colonscopy. Patient was last seen by Vernia Good, NP on 05/10/2023. Per protocol, we request that you comment on cardiac risk for upcoming procedure since office visit was less than 2 months ago.   Request routed to Pharm D for guidance on holding Xarelto .   Please route your response to p cv div preop.  Thank you, Gerldine Koch, NP-C 05/15/2023, 4:04 PM

## 2023-05-16 NOTE — Telephone Encounter (Signed)
 Patient with diagnosis of atrial fibrillation on Xarelto  for anticoagulation.    Procedure:  Colonoscopy/endoscopy   Date of Surgery:  Clearance 05/17/23    CHA2DS2-VASc Score = 4   This indicates a 4.8% annual risk of stroke. The patient's score is based upon: CHF History: 1 HTN History: 1 Diabetes History: 1 Stroke History: 0 Vascular Disease History: 0 Age Score: 1 Gender Score: 0  CrCl >100 Platelet count 274  Patient has not had an Afib/aflutter ablation within the last 3 months or DCCV within the last 30 days  Per office protocol, patient can hold Xarelto  for 1-2 days prior to procedure.   Patient will not need bridging with Lovenox (enoxaparin) around procedure.  **This guidance is not considered finalized until pre-operative APP has relayed final recommendations.**

## 2023-05-16 NOTE — Telephone Encounter (Signed)
Faxed to [facility].

## 2023-05-16 NOTE — Telephone Encounter (Signed)
 Request has been sent to pre-op and they are handling it for review.

## 2023-05-16 NOTE — Anesthesia Preprocedure Evaluation (Signed)
 Anesthesia Evaluation  Patient identified by MRN, date of birth, ID band Patient awake    Reviewed: Allergy & Precautions, NPO status , Patient's Chart, lab work & pertinent test results  History of Anesthesia Complications Negative for: history of anesthetic complications  Airway Mallampati: III  TM Distance: >3 FB Neck ROM: Full    Dental no notable dental hx. (+) Teeth Intact, Dental Advisory Given   Pulmonary asthma , sleep apnea (intolerant of cpap)  Pulmonary Sarcoid   Pulmonary exam normal breath sounds clear to auscultation       Cardiovascular hypertension, Pt. on medications and Pt. on home beta blockers (-) angina +CHF (HFrEF)  (-) Past MI Normal cardiovascular exam+ dysrhythmias Atrial Fibrillation + Cardiac Defibrillator  Rhythm:Regular Rate:Normal  10/2022 Echo   1. Left ventricular ejection fraction, by estimation, is 20 to 25%. The  left ventricle has severely decreased function. The left ventricle  demonstrates regional wall motion abnormalities (see scoring  diagram/findings for description). The left  ventricular internal cavity size was moderately dilated. There is severe  concentric left ventricular hypertrophy. Left ventricular diastolic  parameters are consistent with Grade I diastolic dysfunction (impaired  relaxation).   2. Right ventricular systolic function is normal. The right ventricular  size is mildly enlarged.   3. The mitral valve is normal in structure. No evidence of mitral valve  regurgitation. No evidence of mitral stenosis.   4. The aortic valve is normal in structure. Aortic valve regurgitation is  not visualized. No aortic stenosis is present.   5. There is mild dilatation of the aortic root, measuring 39 mm. There is  mild dilatation of the ascending aorta, measuring 40 mm.   6. The inferior vena cava is normal in size with greater than 50%  respiratory variability, suggesting right  atrial pressure of 3 mmHg.     Neuro/Psych  PSYCHIATRIC DISORDERS  Depression       GI/Hepatic   Endo/Other  diabetes, Type 2  Class 3 obesity  Renal/GU Lab Results      Component                Value               Date                      NA                       138                 05/10/2023                CL                       107                 05/10/2023                K                        4.3                 05/10/2023                CO2                      24  05/10/2023                BUN                      9                   05/10/2023                CREATININE               0.94                05/10/2023                GFRNONAA                 >60                 05/10/2023                CALCIUM                  10.7 (H)            05/10/2023                ALBUMIN                  3.5                 09/21/2009                GLUCOSE                  146 (H)             05/10/2023                Musculoskeletal   Abdominal   Peds  Hematology Lab Results      Component                Value               Date                      WBC                      6.5                 05/10/2023                HGB                      14.0                05/10/2023                HCT                      42.4                05/10/2023                MCV                      86.9                05/10/2023  PLT                      274                 05/10/2023           On xarelto    Anesthesia Other Findings   Reproductive/Obstetrics                             Anesthesia Physical Anesthesia Plan  ASA: 4  Anesthesia Plan: MAC   Post-op Pain Management: Minimal or no pain anticipated   Induction:   PONV Risk Score and Plan: 1 and Propofol  infusion and Treatment may vary due to age or medical condition  Airway Management Planned: Natural Airway, Nasal Cannula and Simple Face Mask  Additional  Equipment: None  Intra-op Plan:   Post-operative Plan:   Informed Consent: I have reviewed the patients History and Physical, chart, labs and discussed the procedure including the risks, benefits and alternatives for the proposed anesthesia with the patient or authorized representative who has indicated his/her understanding and acceptance.     Dental advisory given  Plan Discussed with: CRNA  Anesthesia Plan Comments: ( bright rectal bleeding, constipation for colonoscopy)        Anesthesia Quick Evaluation

## 2023-05-17 ENCOUNTER — Encounter (HOSPITAL_COMMUNITY): Payer: Self-pay | Admitting: Gastroenterology

## 2023-05-17 ENCOUNTER — Ambulatory Visit (HOSPITAL_BASED_OUTPATIENT_CLINIC_OR_DEPARTMENT_OTHER): Admitting: Anesthesiology

## 2023-05-17 ENCOUNTER — Other Ambulatory Visit: Payer: Self-pay

## 2023-05-17 ENCOUNTER — Encounter (HOSPITAL_COMMUNITY)
Admission: RE | Disposition: A | Discharge status: Home / Self Care | Payer: Self-pay | Source: Home / Self Care | Attending: Gastroenterology

## 2023-05-17 ENCOUNTER — Ambulatory Visit (HOSPITAL_COMMUNITY)
Admission: RE | Admit: 2023-05-17 | Discharge: 2023-05-17 | Disposition: A | Discharge status: Home / Self Care | Payer: 59 | Attending: Gastroenterology | Admitting: Gastroenterology

## 2023-05-17 ENCOUNTER — Ambulatory Visit (HOSPITAL_COMMUNITY): Admitting: Anesthesiology

## 2023-05-17 DIAGNOSIS — Z794 Long term (current) use of insulin: Secondary | ICD-10-CM | POA: Diagnosis not present

## 2023-05-17 DIAGNOSIS — I5022 Chronic systolic (congestive) heart failure: Secondary | ICD-10-CM | POA: Diagnosis not present

## 2023-05-17 DIAGNOSIS — F32A Depression, unspecified: Secondary | ICD-10-CM | POA: Insufficient documentation

## 2023-05-17 DIAGNOSIS — K5900 Constipation, unspecified: Secondary | ICD-10-CM | POA: Insufficient documentation

## 2023-05-17 DIAGNOSIS — K642 Third degree hemorrhoids: Secondary | ICD-10-CM | POA: Insufficient documentation

## 2023-05-17 DIAGNOSIS — G4730 Sleep apnea, unspecified: Secondary | ICD-10-CM | POA: Diagnosis not present

## 2023-05-17 DIAGNOSIS — Z6841 Body Mass Index (BMI) 40.0 and over, adult: Secondary | ICD-10-CM | POA: Insufficient documentation

## 2023-05-17 DIAGNOSIS — E119 Type 2 diabetes mellitus without complications: Secondary | ICD-10-CM | POA: Insufficient documentation

## 2023-05-17 DIAGNOSIS — I480 Paroxysmal atrial fibrillation: Secondary | ICD-10-CM | POA: Diagnosis not present

## 2023-05-17 DIAGNOSIS — D123 Benign neoplasm of transverse colon: Secondary | ICD-10-CM | POA: Diagnosis not present

## 2023-05-17 DIAGNOSIS — Z79899 Other long term (current) drug therapy: Secondary | ICD-10-CM | POA: Insufficient documentation

## 2023-05-17 DIAGNOSIS — E785 Hyperlipidemia, unspecified: Secondary | ICD-10-CM | POA: Diagnosis not present

## 2023-05-17 DIAGNOSIS — K921 Melena: Secondary | ICD-10-CM | POA: Insufficient documentation

## 2023-05-17 DIAGNOSIS — Z95810 Presence of automatic (implantable) cardiac defibrillator: Secondary | ICD-10-CM | POA: Insufficient documentation

## 2023-05-17 DIAGNOSIS — I110 Hypertensive heart disease with heart failure: Secondary | ICD-10-CM | POA: Insufficient documentation

## 2023-05-17 DIAGNOSIS — J45909 Unspecified asthma, uncomplicated: Secondary | ICD-10-CM | POA: Diagnosis not present

## 2023-05-17 DIAGNOSIS — D124 Benign neoplasm of descending colon: Secondary | ICD-10-CM | POA: Insufficient documentation

## 2023-05-17 DIAGNOSIS — I4891 Unspecified atrial fibrillation: Secondary | ICD-10-CM | POA: Insufficient documentation

## 2023-05-17 DIAGNOSIS — K644 Residual hemorrhoidal skin tags: Secondary | ICD-10-CM | POA: Diagnosis not present

## 2023-05-17 DIAGNOSIS — K920 Hematemesis: Secondary | ICD-10-CM

## 2023-05-17 DIAGNOSIS — Z7901 Long term (current) use of anticoagulants: Secondary | ICD-10-CM | POA: Diagnosis not present

## 2023-05-17 DIAGNOSIS — E66813 Obesity, class 3: Secondary | ICD-10-CM | POA: Insufficient documentation

## 2023-05-17 LAB — GLUCOSE, CAPILLARY: Glucose-Capillary: 77 mg/dL (ref 70–99)

## 2023-05-17 SURGERY — COLONOSCOPY WITH PROPOFOL
Anesthesia: Monitor Anesthesia Care | Laterality: Bilateral

## 2023-05-17 MED ORDER — PROPOFOL 1000 MG/100ML IV EMUL
INTRAVENOUS | Status: AC
  Filled 2023-05-17: qty 100

## 2023-05-17 MED ORDER — LACTATED RINGERS IV SOLN: INTRAVENOUS | Status: DC | PRN

## 2023-05-17 MED ORDER — RIVAROXABAN 20 MG PO TABS: 20.0000 mg | ORAL_TABLET | Freq: Every day | ORAL | Status: AC

## 2023-05-17 MED ORDER — SODIUM CHLORIDE 0.9 % IV SOLN: INTRAVENOUS | Status: DC

## 2023-05-17 MED ORDER — PROPOFOL 500 MG/50ML IV EMUL
INTRAVENOUS | Status: DC | PRN
Start: 2023-05-17 — End: 2023-05-17
  Administered 2023-05-17: 100 ug/kg/min via INTRAVENOUS

## 2023-05-17 MED ORDER — METOPROLOL SUCCINATE ER 25 MG PO TB24
25.0000 mg | ORAL_TABLET | Freq: Every day | ORAL | Status: DC
  Administered 2023-05-17: 25 mg via ORAL
  Filled 2023-05-17: qty 1

## 2023-05-17 MED ORDER — LIDOCAINE 2% (20 MG/ML) 5 ML SYRINGE
INTRAMUSCULAR | Status: DC | PRN
  Administered 2023-05-17: 80 mg via INTRAVENOUS

## 2023-05-17 MED ORDER — PROPOFOL 10 MG/ML IV BOLUS
INTRAVENOUS | Status: DC | PRN
  Administered 2023-05-17: 20 mg via INTRAVENOUS

## 2023-05-17 SURGICAL SUPPLY — 17 items
ELECTRODE REM PT RTRN 9FT ADLT (ELECTROSURGICAL) IMPLANT
FORCEPS BIOP RAD 4 LRG CAP 4 (CUTTING FORCEPS) IMPLANT
FORCEPS BXJMBJMB 240X2.8X (CUTTING FORCEPS) IMPLANT
INJECTOR/SNARE I SNARE (MISCELLANEOUS) IMPLANT
LUBRICANT JELLY 4.5OZ STERILE (MISCELLANEOUS) IMPLANT
MANIFOLD NEPTUNE II (INSTRUMENTS) IMPLANT
NDL SCLEROTHERAPY 25GX240 (NEEDLE) IMPLANT
NEEDLE SCLEROTHERAPY 25GX240 (NEEDLE) IMPLANT
PAD FLOOR 36X40 (MISCELLANEOUS) ×2 IMPLANT
PROBE APC STR FIRE (PROBE) IMPLANT
PROBE INJECTION GOLD 7FR (MISCELLANEOUS) IMPLANT
SNARE ROTATE MED OVAL 20MM (MISCELLANEOUS) IMPLANT
SYR 50ML LL SCALE MARK (SYRINGE) IMPLANT
TRAP SPECIMEN MUCOUS 40CC (MISCELLANEOUS) IMPLANT
TUBING ENDO SMARTCAP PENTAX (MISCELLANEOUS) IMPLANT
TUBING IRRIGATION ENDOGATOR (MISCELLANEOUS) ×2 IMPLANT
WATER STERILE IRR 1000ML POUR (IV SOLUTION) IMPLANT

## 2023-05-17 NOTE — Anesthesia Postprocedure Evaluation (Signed)
 Anesthesia Post Note  Patient: [name]  Procedure(s) Performed: COLONOSCOPY WITH PROPOFOL  (Bilateral) POLYPECTOMY, INTESTINE     Patient location during evaluation: Endoscopy Anesthesia Type: MAC Level of consciousness: awake and alert Pain management: pain level controlled Vital Signs Assessment: post-procedure vital signs reviewed and stable Respiratory status: spontaneous breathing, nonlabored ventilation, respiratory function stable and patient connected to nasal cannula oxygen Cardiovascular status: blood pressure returned to baseline and stable Postop Assessment: no apparent nausea or vomiting Anesthetic complications: no  No notable events documented.  Last Vitals:  Vitals:   05/17/23 0920 05/17/23 0930  BP: 133/82 (!) 140/76  Pulse: 63 71  Resp: 17 19  Temp:    SpO2: 98% 96%    Last Pain:  Vitals:   05/17/23 0930  TempSrc:   PainSc: 0-No pain                 Rosalita Combe

## 2023-05-17 NOTE — Discharge Instructions (Addendum)
Colonoscopy ° °Post procedure instructions: ° °Read the instructions outlined below and refer to this sheet in the next few weeks. These discharge instructions provide you with general information on caring for yourself after you leave the hospital. Your doctor may also give you specific instructions. While your treatment has been planned according to the most current medical practices available, unavoidable complications occasionally occur. If you have any problems or questions after discharge, call Dr. Outlaw at [facility] ([phone]). ° °HOME CARE INSTRUCTIONS ° °ACTIVITY: °· You may resume your regular activity, but move at a slower pace for the next 24 hours.  °· Take frequent rest periods for the next 24 hours.  °· Walking will help get rid of the air and reduce the bloated feeling in your belly (abdomen).  °· No driving for 24 hours (because of the medicine (anesthesia) used during the test).  °· You may shower.  °· Do not sign any important legal documents or operate any machinery for 24 hours (because of the anesthesia used during the test).  °NUTRITION: °· Drink plenty of fluids.  °· You may resume your normal diet as instructed by your doctor.  °· Begin with a light meal and progress to your normal diet. Heavy or fried foods are harder to digest and may make you feel sick to your stomach (nauseated).  °· Avoid alcoholic beverages for 24 hours or as instructed.  °MEDICATIONS: °· You may resume your normal medications unless your doctor tells you otherwise.  °WHAT TO EXPECT TODAY: °· Some feelings of bloating in the abdomen.  °· Passage of more gas than usual.  °· Spotting of blood in your stool or on the toilet paper.  °IF YOU HAD POLYPS REMOVED DURING THE COLONOSCOPY: °· No aspirin products for 7 days or as instructed.  °· No alcohol for 7 days or as instructed.  °· Eat a soft diet for the next 24 hours.  ° °FINDING OUT THE RESULTS OF YOUR TEST ° °Not all test results are available during your  visit. If your test results are not back during the visit, make an appointment with your caregiver to find out the results. Do not assume everything is normal if you have not heard from your caregiver or the medical facility. It is important for you to follow up on all of your test results.  ° ° ° °SEEK IMMEDIATE MEDICAL CARE IF: ° °· You have more than a spotting of blood in your stool.  °· Your belly is swollen (abdominal distention).  °· You are nauseated or vomiting.  °· You have a fever.  °· You have abdominal pain or discomfort that is severe or gets worse throughout the day.  ° ° °Document Released: 08/11/2003 Document Revised: 09/08/2010 Document Reviewed: 08/09/2007 °Exit[employer]® Patient Information ©2012 [employer]. ° °

## 2023-05-17 NOTE — Op Note (Signed)
 [facility] Patient Name: [name] Procedure Date: 05/17/2023 MRN: [id number] Attending MD: Evangeline Hilts , MD, [id number] Date of Birth: 1955/11/17 CSN: [id number] Age: 68 Admit Type: Outpatient Procedure:                Colonoscopy Indications:              Hematochezia Providers:                Evangeline Hilts, MD, Nadean August, RN, Tyrus Gallus, Technician Referring MD:              Medicines:                Monitored Anesthesia Care Complications:            No immediate complications. Estimated Blood Loss:     Estimated blood loss: none. Procedure:                Pre-Anesthesia Assessment:                           - Prior to the procedure, a History and Physical                            was performed, and patient medications and                            allergies were reviewed. The patient's tolerance of                            previous anesthesia was also reviewed. The risks                            and benefits of the procedure and the sedation                            options and risks were discussed with the patient.                            All questions were answered, and informed consent                            was obtained. Prior Anticoagulants: The patient has                            taken Xarelto  (rivaroxaban ), last dose was more                            than 4 weeks prior to procedure. ASA Grade                            Assessment: IV - A patient with severe systemic                            disease that  is a constant threat to life. After                            reviewing the risks and benefits, the patient was                            deemed in satisfactory condition to undergo the                            procedure.                           After obtaining informed consent, the colonoscope                            was passed under direct vision. Throughout the                             procedure, the patient's blood pressure, pulse, and                            oxygen saturations were monitored continuously. The                            CF-HQ190L ([id number] Olympus colonoscope was                            introduced through the anus and advanced to the the                            cecum, identified by appendiceal orifice and                            ileocecal valve. The ileocecal valve, appendiceal                            orifice, and rectum were photographed. The entire                            colon was examined. The colonoscopy was performed                            without difficulty. The patient tolerated the                            procedure well. The quality of the bowel                            preparation was fair. Scope In: 8:42:09 AM Scope Out: 8:59:32 AM Scope Withdrawal Time: 0 hours 15 minutes 26 seconds  Total Procedure Duration: 0 hours 17 minutes 23 seconds  Findings:      Hemorrhoids were found on perianal exam.      Internal external and internal hemorrhoids were found during       retroflexion and during perianal  exam. The hemorrhoids were moderate and       Grade III (internal hemorrhoids that prolapse but require manual       reduction).      Bowel prep diffusely fair; semisolid and viscous stool obscured some       views throughout colon; diminutive or subtle sessile polyps could easily       have been missed.      Two sessile polyps were found in the transverse colon. The polyps were 2       to 4 mm in size. These polyps were removed with a cold biopsy forceps.       Resection and retrieval were complete.      Many sessile polyps were found in the descending colon and transverse       colon. The polyps were 6 to 8 mm in size. These polyps were removed with       a hot snare. Resection and retrieval were complete.      The exam was otherwise without abnormality on direct and retroflexion        views. Impression:               - Preparation of the colon was fair.                           - Hemorrhoids found on perianal exam.                           - Internal external and internal hemorrhoids.                           - Two 2 to 4 mm polyps in the transverse colon,                            removed with a cold biopsy forceps. Resected and                            retrieved.                           - Many 6 to 8 mm polyps in the descending colon and                            in the transverse colon, removed with a hot snare.                            Resected and retrieved.                           - The examination was otherwise normal on direct                            and retroflexion views.                           - The examination was otherwise normal. Suspect  bleeding from hemorrhoids. Moderate Sedation:      None Recommendation:           - Patient has a contact number available for                            emergencies. The signs and symptoms of potential                            delayed complications were discussed with the                            patient. Return to normal activities tomorrow.                            Written discharge instructions were provided to the                            patient.                           - Discharge patient to home (ambulatory).                           - High fiber diet indefinitely.                           - Continue present medications.                           - Await pathology results.                           - Repeat colonoscopy, timing pending pathology                            results in part, but likely within 2-3 years with                            two-day prep.                           - Return to GI clinic after studies are complete.                           - Return to referring physician as previously                            scheduled. Procedure  Code(s):        --- Professional ---                           [phone], Colonoscopy, flexible; with removal of                            tumor(s), polyp(s), or other lesion(s) by snare  technique                           45380, 59, Colonoscopy, flexible; with biopsy,                            single or multiple Diagnosis Code(s):        --- Professional ---                           K64.2, Third degree hemorrhoids                           D12.4, Benign neoplasm of descending colon                           D12.3, Benign neoplasm of transverse colon (hepatic                            flexure or splenic flexure)                           K92.1, Melena (includes Hematochezia) CPT copyright 2022 [employer]. All rights reserved. The codes documented in this report are preliminary and upon coder review may  be revised to meet current compliance requirements. Evangeline Hilts, MD 05/17/2023 9:07:51 AM This report has been signed electronically. Number of Addenda: 0

## 2023-05-17 NOTE — Transfer of Care (Signed)
 Immediate Anesthesia Transfer of Care Note  Patient: [name]  Procedure(s) Performed: COLONOSCOPY WITH PROPOFOL  (Bilateral) POLYPECTOMY, INTESTINE  Patient Location: PACU  Anesthesia Type:MAC  Level of Consciousness: awake, alert , and oriented  Airway & Oxygen Therapy: Patient Spontanous Breathing and Patient connected to face mask oxygen  Post-op Assessment: Report given to RN and Post -op Vital signs reviewed and stable  Post vital signs: Reviewed and stable  Last Vitals:  Vitals Value Taken Time  BP 127/62 05/17/23 0906  Temp    Pulse 69 05/17/23 0907  Resp 19 05/17/23 0907  SpO2 99 % 05/17/23 0907  Vitals shown include unfiled device data.  Last Pain:  Vitals:   05/17/23 0808  TempSrc: Temporal  PainSc: 0-No pain         Complications: No notable events documented.

## 2023-05-17 NOTE — H&P (Signed)
 [facility] H/P Note  Chief Complaint: blood in stool  HPI: [name] is an 68 y.o. male.  Constipation and blood in stool.  For colonoscopy.  Last colonoscopy 2014.  No known family history of colon cancer or polyps.  Past Medical History:  Diagnosis Date   Asthma    Automatic implantable cardioverter-defibrillator in situ    Back pain    Chronic systolic CHF (congestive heart failure), NYHA class 3 (HCC)    Complete heart block (HCC)    s/p PPM 1997 with upgrade to AICD 2011   Depression    Dilated aortic root (HCC)    40mm ascending aortic root   DM (diabetes mellitus) (HCC)    Glaucoma    HTN (hypertension)    Hyperlipidemia    Nonischemic cardiomyopathy (HCC)    EF of 15-20% by echo 2015 and 20% by nuclear stress test 2017   Obesity    PAF (paroxysmal atrial fibrillation) (HCC)    13 minutes of PAF documented on ICD check 2018 - anticoagulated with Eliquis   Sarcoid    Sleep apnea 06-28-12   no cpap used-unable to tolerate   SVT (supraventricular tachycardia) (HCC)    s/p ablation    Past Surgical History:  Procedure Laterality Date   COLONOSCOPY WITH PROPOFOL  N/A 07/17/2012   Procedure: COLONOSCOPY WITH PROPOFOL ;  Surgeon: Garrett Kallman, MD;  Location: [facility];  Service: Endoscopy;  Laterality: N/A;   HEMORRHOID SURGERY     early 20's   ICD GENERATOR CHANGEOUT N/A 05/28/2018   Procedure: ICD GENERATOR CHANGEOUT;  Surgeon: Tammie Fall, MD;  Location: [facility] INVASIVE CV LAB;  Service: Cardiovascular;  Laterality: N/A;   IR US  GUIDE VASC ACCESS LEFT  12/21/2021   IR VENO/EXT/UNI LEFT  12/21/2021   PACEMAKER INSERTION     RIGHT/LEFT HEART CATH AND CORONARY ANGIOGRAPHY N/A 04/14/2021   Procedure: RIGHT/LEFT HEART CATH AND CORONARY ANGIOGRAPHY;  Surgeon: Mardell Shade, MD;  Location: [facility] INVASIVE CV LAB;  Service: Cardiovascular;  Laterality: N/A;   TONSILLECTOMY      Medications Prior to Admission  Medication Sig Dispense Refill    furosemide  (LASIX ) 40 MG tablet Take 1 tablet (40 mg total) by mouth daily. 90 tablet 3   MAGNESIUM PO Take 200 mg by mouth every other day.     RESTASIS 0.05 % ophthalmic emulsion Place 1 drop into both eyes 2 (two) times daily as needed (dry eyes).     thiamine (VITAMIN B-1) 50 MG tablet Take 50 mg by mouth daily. As needed     tiZANidine (ZANAFLEX) 4 MG capsule Take 4 mg by mouth at bedtime as needed for muscle spasms.     Aspirin -Caffeine (BC FAST PAIN RELIEF PO) Take 1 packet by mouth daily as needed (pain).     JARDIANCE 25 MG TABS tablet Take 25 mg by mouth daily.     KLOR-CON  M20 20 MEQ tablet TAKE 1 TABLET (20 MEQ TOTAL) BY MOUTH AS NEEDED. WITH LASIX  DOSE ONLY 90 tablet 2   metoprolol  succinate (TOPROL -XL) 50 MG 24 hr tablet TAKE 1 TABLET BY MOUTH DAILY WITH OR IMMEDIATELY FOLLOWING A MEAL 90 tablet 3   rivaroxaban  (XARELTO ) 20 MG TABS tablet Take 1 tablet (20 mg total) by mouth daily with supper. 30 tablet 11   rosuvastatin (CRESTOR) 20 MG tablet Take 20 mg by mouth daily.     sacubitril -valsartan  (ENTRESTO ) 97-103 MG Take 1 tablet by mouth 2 (two) times daily. 180 tablet 3  spironolactone  (ALDACTONE ) 25 MG tablet TAKE 1 TABLET (25 MG TOTAL) BY MOUTH DAILY. 90 tablet 3   TRESIBA FLEXTOUCH 200 UNIT/ML SOPN Inject 40 Units into the skin every morning.     Vitamin D, Ergocalciferol, (DRISDOL) 1.25 MG (50000 UNIT) CAPS capsule Take 50,000 Units by mouth once a week. Takes on Sundays     zinc gluconate 50 MG tablet Take 50 mg by mouth every other day.      Allergies:  Allergies  Allergen Reactions   Penicillins Other (See Comments)    Dizziness Did it involve swelling of the face/tongue/throat, SOB, or low BP? Yes Did it involve sudden or severe rash/hives, skin peeling, or any reaction on the inside of your mouth or nose? No Did you need to seek medical attention at a hospital or doctor's office? Yes When did it last happen?      childhood If all above answers are "NO", may proceed  with cephalosporin use.     Family History  Problem Relation Age of Onset   Diabetes Mother    Alzheimer's disease Mother    CVA Father    Heart failure Father    Epilepsy Sister    Diabetes Brother    Diabetes Sister     Social History:  reports that he has never smoked. He has never used smokeless tobacco. He reports that he does not currently use alcohol. He reports that he does not use drugs.   ROS: As per HPI, all others negative   Blood pressure 137/61, pulse 80, temperature (!) 97.4 F (36.3 C), temperature source Temporal, resp. rate 20, height 5\' 10"  (1.778 m), weight (!) 151.5 kg, SpO2 99%. General appearance: NAD CV:  No tachycardia RESP:  No visible distress ABD:  Soft, protuberant, non-tender NEURO:  A/O, no encephalopathy  Results for orders placed or performed during the hospital encounter of 05/17/23 (from the past 48 hours)  Glucose, capillary     Status: None   Collection Time: 05/17/23  8:07 AM  Result Value Ref Range   Glucose-Capillary 77 70 - 99 mg/dL    Comment: Glucose reference range applies only to samples taken after fasting for at least 8 hours.   No results found.  Assessment/Plan   Hematochezia and constipation. Colonoscopy for further evaluation. Xarelto , on hold. Risks (bleeding, infection, bowel perforation that could require surgery, sedation-related changes in cardiopulmonary systems), benefits (identification and possible treatment of source of symptoms, exclusion of certain causes of symptoms), and alternatives (watchful waiting, radiographic imaging studies, empiric medical treatment) of colonoscopy were explained to patient/family in detail and patient wishes to proceed.   Yves Herb 05/17/2023, 8:25 AM

## 2023-05-18 LAB — SURGICAL PATHOLOGY

## 2023-05-25 ENCOUNTER — Encounter: Payer: Self-pay | Admitting: Family Medicine

## 2023-05-26 ENCOUNTER — Ambulatory Visit (INDEPENDENT_AMBULATORY_CARE_PROVIDER_SITE_OTHER): Payer: Medicare Other

## 2023-05-26 DIAGNOSIS — I442 Atrioventricular block, complete: Secondary | ICD-10-CM | POA: Diagnosis not present

## 2023-06-08 LAB — CUP PACEART REMOTE DEVICE CHECK
Battery Remaining Longevity: 34 mo
Battery Remaining Percentage: 36 %
Battery Voltage: 2.9 V
Brady Statistic AP VP Percent: 21 %
Brady Statistic AP VS Percent: 1 %
Brady Statistic AS VP Percent: 78 %
Brady Statistic AS VS Percent: 1 %
Brady Statistic RA Percent Paced: 20 %
Brady Statistic RV Percent Paced: 98 %
Date Time Interrogation Session: 20250518170032
HighPow Impedance: 45 Ohm
HighPow Impedance: 46 Ohm
Lead Channel Impedance Value: 330 Ohm
Lead Channel Impedance Value: 480 Ohm
Lead Channel Pacing Threshold Amplitude: 0.75 V
Lead Channel Pacing Threshold Amplitude: 0.875 V
Lead Channel Pacing Threshold Pulse Width: 0.5 ms
Lead Channel Pacing Threshold Pulse Width: 0.5 ms
Lead Channel Sensing Intrinsic Amplitude: 1.7 mV
Lead Channel Sensing Intrinsic Amplitude: 12 mV
Lead Channel Setting Pacing Amplitude: 2 V
Lead Channel Setting Pacing Amplitude: 2 V
Lead Channel Setting Pacing Pulse Width: 0.5 ms
Lead Channel Setting Sensing Sensitivity: 0.5 mV
Pulse Gen Serial Number: 9829382

## 2023-06-15 ENCOUNTER — Ambulatory Visit: Payer: Self-pay | Admitting: Internal Medicine

## 2023-07-03 NOTE — Progress Notes (Signed)
 Remote ICD transmission.

## 2023-07-03 NOTE — Addendum Note (Signed)
 Addended by: VICCI SELLER A on: 07/03/2023 09:06 AM   Modules accepted: Orders

## 2023-07-12 ENCOUNTER — Other Ambulatory Visit: Payer: Self-pay | Admitting: Internal Medicine

## 2023-08-25 ENCOUNTER — Ambulatory Visit (INDEPENDENT_AMBULATORY_CARE_PROVIDER_SITE_OTHER): Payer: Medicare Other

## 2023-08-25 DIAGNOSIS — I442 Atrioventricular block, complete: Secondary | ICD-10-CM | POA: Diagnosis not present

## 2023-08-28 LAB — CUP PACEART REMOTE DEVICE CHECK
Battery Remaining Longevity: 31 mo
Battery Remaining Percentage: 33 %
Battery Voltage: 2.9 V
Brady Statistic AP VP Percent: 23 %
Brady Statistic AP VS Percent: 1 %
Brady Statistic AS VP Percent: 76 %
Brady Statistic AS VS Percent: 1 %
Brady Statistic RA Percent Paced: 22 %
Brady Statistic RV Percent Paced: 98 %
Date Time Interrogation Session: 20250815020015
HighPow Impedance: 50 Ohm
HighPow Impedance: 50 Ohm
Lead Channel Impedance Value: 360 Ohm
Lead Channel Impedance Value: 490 Ohm
Lead Channel Pacing Threshold Amplitude: 0.75 V
Lead Channel Pacing Threshold Amplitude: 0.75 V
Lead Channel Pacing Threshold Pulse Width: 0.5 ms
Lead Channel Pacing Threshold Pulse Width: 0.5 ms
Lead Channel Sensing Intrinsic Amplitude: 2.1 mV
Lead Channel Sensing Intrinsic Amplitude: 9.4 mV
Lead Channel Setting Pacing Amplitude: 2 V
Lead Channel Setting Pacing Amplitude: 2 V
Lead Channel Setting Pacing Pulse Width: 0.5 ms
Lead Channel Setting Sensing Sensitivity: 0.5 mV
Pulse Gen Serial Number: 9829382

## 2023-08-29 ENCOUNTER — Ambulatory Visit: Payer: Self-pay | Admitting: Internal Medicine

## 2023-10-04 NOTE — Progress Notes (Signed)
Remote ICD Transmission.

## 2023-11-24 ENCOUNTER — Ambulatory Visit: Payer: Medicare Other

## 2023-11-24 DIAGNOSIS — I442 Atrioventricular block, complete: Secondary | ICD-10-CM

## 2023-11-26 ENCOUNTER — Ambulatory Visit: Payer: Self-pay | Admitting: Internal Medicine

## 2023-11-26 LAB — CUP PACEART REMOTE DEVICE CHECK
Battery Remaining Longevity: 29 mo
Battery Remaining Percentage: 32 %
Battery Voltage: 2.89 V
Brady Statistic AP VP Percent: 23 %
Brady Statistic AP VS Percent: 1 %
Brady Statistic AS VP Percent: 75 %
Brady Statistic AS VS Percent: 1 %
Brady Statistic RA Percent Paced: 22 %
Brady Statistic RV Percent Paced: 98 %
Date Time Interrogation Session: 20251114020017
HighPow Impedance: 50 Ohm
HighPow Impedance: 50 Ohm
Lead Channel Impedance Value: 330 Ohm
Lead Channel Impedance Value: 510 Ohm
Lead Channel Pacing Threshold Amplitude: 0.75 V
Lead Channel Pacing Threshold Amplitude: 0.875 V
Lead Channel Pacing Threshold Pulse Width: 0.5 ms
Lead Channel Pacing Threshold Pulse Width: 0.5 ms
Lead Channel Sensing Intrinsic Amplitude: 11.9 mV
Lead Channel Sensing Intrinsic Amplitude: 2.9 mV
Lead Channel Setting Pacing Amplitude: 2 V
Lead Channel Setting Pacing Amplitude: 2 V
Lead Channel Setting Pacing Pulse Width: 0.5 ms
Lead Channel Setting Sensing Sensitivity: 0.5 mV
Pulse Gen Serial Number: 9829382

## 2023-11-28 NOTE — Progress Notes (Signed)
 Remote ICD Transmission

## 2023-12-13 ENCOUNTER — Other Ambulatory Visit: Payer: Self-pay | Admitting: Internal Medicine

## 2023-12-13 DIAGNOSIS — I5022 Chronic systolic (congestive) heart failure: Secondary | ICD-10-CM

## 2024-01-17 ENCOUNTER — Other Ambulatory Visit: Payer: Self-pay | Admitting: Internal Medicine

## 2024-01-29 ENCOUNTER — Ambulatory Visit

## 2024-01-29 ENCOUNTER — Telehealth: Payer: Self-pay

## 2024-01-29 NOTE — Telephone Encounter (Signed)
 Spoke w/ patient regarding recent device episode for VT/VF event successfully treated w/ ATP. Patient states he was asymptomatic during this time. States he has noticed HR slightly increase but does not note any symptoms associated. Patient currently on 50mg  Toprol -XL daily. Patient states he did miss one dose of metoprolol  this weekend, other wise has been compliant w/ medications.  Patient given 6 month DMV driving restriction. Verbalized understanding.   Informed to contact device clinic if symptoms arise or for any further questions. ED precaution given if severe symptoms occur. Verbalized understanding.   Patient due for yearly F/U in March, 2026.   Will forward to provider for awareness and further recommendations.

## 2024-01-29 NOTE — Telephone Encounter (Signed)
 Spoke w/ [facility] Representative who has been pushed back and can not make the 3:00pm device clinic appointment today. Request for patient to come at a later time or reschedule for another day.   Spoke w/ patient and requested to reschedule device clinic appointment for 2:00pm tomorrow, 01/30/2024. [facility] Rep aware.   Will continue to monitor and update accordingly.

## 2024-01-29 NOTE — Telephone Encounter (Signed)
 Alert remote transmission: VT/VF occurred, successful ATP therapy Event occurred 1/16 @ 20:27, HR 240, V>A, pace terminated with 1 burst of ATP - route to triage Subtle increase in HR's per trends  ____________________________________________________________________  VT/VF event occurred on 01/26/2024 at 8:27pm w/ duration of 10 seconds. Successfully treated w/ ATP.   Will call patient to assess symptoms and medication compliance. Will inform patient of DMV 6 month driving restriction.

## 2024-01-29 NOTE — Telephone Encounter (Signed)
 After reviewing w/ Dr. Kennyth and [facility] Representative, Dorise, recommendation to bring patient into device clinic for reprogramming changes. Need to test VA conduction test again and program Rate Responsive PVARP ON. [facility] Rep to be present during visit.    Spoke w/ patient who is agreeable for device clinic appointment. Scheduled to come into clinic today, 01/29/2024, @ 3:00pm.   Will continue to monitor and update accordingly.

## 2024-01-30 ENCOUNTER — Ambulatory Visit

## 2024-01-30 DIAGNOSIS — I5022 Chronic systolic (congestive) heart failure: Secondary | ICD-10-CM

## 2024-01-30 DIAGNOSIS — I442 Atrioventricular block, complete: Secondary | ICD-10-CM

## 2024-01-30 LAB — CUP PACEART INCLINIC DEVICE CHECK
Battery Remaining Longevity: 27 mo
Brady Statistic RA Percent Paced: 21 %
Brady Statistic RV Percent Paced: 98 %
Date Time Interrogation Session: 20260120135524
HighPow Impedance: 49.8827
Implantable Lead Connection Status: 753985
Implantable Lead Connection Status: 753985
Implantable Lead Implant Date: 19961213
Implantable Lead Implant Date: 20111202
Implantable Lead Location: 753859
Implantable Lead Location: 753860
Implantable Lead Model: 5524
Implantable Lead Model: 7121
Implantable Pulse Generator Implant Date: 20200518
Lead Channel Impedance Value: 337.5 Ohm
Lead Channel Impedance Value: 525 Ohm
Lead Channel Pacing Threshold Amplitude: 1 V
Lead Channel Pacing Threshold Pulse Width: 0.5 ms
Lead Channel Sensing Intrinsic Amplitude: 2 mV
Lead Channel Sensing Intrinsic Amplitude: 8.9 mV
Lead Channel Setting Pacing Amplitude: 2 V
Lead Channel Setting Pacing Amplitude: 2 V
Lead Channel Setting Pacing Pulse Width: 0.5 ms
Lead Channel Setting Sensing Sensitivity: 0.5 mV
Pulse Gen Serial Number: 9829382

## 2024-01-30 NOTE — Progress Notes (Signed)
 Full device check not performed. Patient brought in acutely to device clinic for reprogramming changes per Dr. Kennyth. Noted to have VT episode w/ ATP on 01/19/2024 w/ duration of 9 seconds.  Prior to event an AS did not get tracked which set up a short-long sequence which initiated VT. Reviewed w/ Dr. Kennyth & [facility] Representative and the following programming changes were made.   Program Changes per [facility] Representative: - Max Track rate reprogrammed from 120bpm to 140bpm.  - Atrial Tachycardia Detection Rate reprogrammed from 160bpm to 180bpm. - PMT detection rate reprogrammed from 120bpm to 140bpm.  - Rate Responsive PVARP reprogrammed from OFF to ON (medium) w/ shortest PVARP @ .  Will continue to monitor and update accordingly.

## 2024-01-30 NOTE — Patient Instructions (Signed)
 Follow up as scheduled.

## 2024-02-01 ENCOUNTER — Ambulatory Visit: Payer: Self-pay | Admitting: Cardiology

## 2024-02-09 ENCOUNTER — Encounter (HOSPITAL_COMMUNITY): Payer: Self-pay | Admitting: Internal Medicine

## 2024-02-09 ENCOUNTER — Ambulatory Visit (HOSPITAL_COMMUNITY)
Admission: RE | Admit: 2024-02-09 | Discharge: 2024-02-09 | Disposition: A | Source: Ambulatory Visit | Attending: Internal Medicine | Admitting: Internal Medicine

## 2024-02-09 VITALS — BP 104/60 | HR 71 | Wt 342.4 lb

## 2024-02-09 DIAGNOSIS — E782 Mixed hyperlipidemia: Secondary | ICD-10-CM

## 2024-02-09 DIAGNOSIS — G4733 Obstructive sleep apnea (adult) (pediatric): Secondary | ICD-10-CM | POA: Diagnosis not present

## 2024-02-09 DIAGNOSIS — I110 Hypertensive heart disease with heart failure: Secondary | ICD-10-CM | POA: Insufficient documentation

## 2024-02-09 DIAGNOSIS — I5022 Chronic systolic (congestive) heart failure: Secondary | ICD-10-CM | POA: Diagnosis present

## 2024-02-09 DIAGNOSIS — D860 Sarcoidosis of lung: Secondary | ICD-10-CM | POA: Diagnosis not present

## 2024-02-09 DIAGNOSIS — I428 Other cardiomyopathies: Secondary | ICD-10-CM | POA: Diagnosis not present

## 2024-02-09 DIAGNOSIS — Z6841 Body Mass Index (BMI) 40.0 and over, adult: Secondary | ICD-10-CM | POA: Insufficient documentation

## 2024-02-09 DIAGNOSIS — I2510 Atherosclerotic heart disease of native coronary artery without angina pectoris: Secondary | ICD-10-CM | POA: Insufficient documentation

## 2024-02-09 DIAGNOSIS — E119 Type 2 diabetes mellitus without complications: Secondary | ICD-10-CM | POA: Insufficient documentation

## 2024-02-09 DIAGNOSIS — Z794 Long term (current) use of insulin: Secondary | ICD-10-CM | POA: Diagnosis not present

## 2024-02-09 DIAGNOSIS — Z7984 Long term (current) use of oral hypoglycemic drugs: Secondary | ICD-10-CM | POA: Diagnosis not present

## 2024-02-09 DIAGNOSIS — Z7901 Long term (current) use of anticoagulants: Secondary | ICD-10-CM | POA: Insufficient documentation

## 2024-02-09 DIAGNOSIS — Z79899 Other long term (current) drug therapy: Secondary | ICD-10-CM | POA: Diagnosis not present

## 2024-02-09 DIAGNOSIS — I480 Paroxysmal atrial fibrillation: Secondary | ICD-10-CM | POA: Diagnosis not present

## 2024-02-09 DIAGNOSIS — Z95810 Presence of automatic (implantable) cardiac defibrillator: Secondary | ICD-10-CM | POA: Diagnosis not present

## 2024-02-09 DIAGNOSIS — E669 Obesity, unspecified: Secondary | ICD-10-CM | POA: Insufficient documentation

## 2024-02-09 LAB — LIPID PANEL
Cholesterol: 168 mg/dL (ref 0–200)
HDL: 44 mg/dL
LDL Cholesterol: 109 mg/dL — ABNORMAL HIGH (ref 0–99)
Total CHOL/HDL Ratio: 3.8 ratio
Triglycerides: 75 mg/dL
VLDL: 15 mg/dL (ref 0–40)

## 2024-02-09 LAB — COMPREHENSIVE METABOLIC PANEL WITH GFR
ALT: 12 U/L (ref 0–44)
AST: 16 U/L (ref 15–41)
Albumin: 3.9 g/dL (ref 3.5–5.0)
Alkaline Phosphatase: 75 U/L (ref 38–126)
Anion gap: 7 (ref 5–15)
BUN: 9 mg/dL (ref 8–23)
CO2: 26 mmol/L (ref 22–32)
Calcium: 10.8 mg/dL — ABNORMAL HIGH (ref 8.9–10.3)
Chloride: 107 mmol/L (ref 98–111)
Creatinine, Ser: 1.03 mg/dL (ref 0.61–1.24)
GFR, Estimated: >60 mL/min
Glucose, Bld: 118 mg/dL — ABNORMAL HIGH (ref 70–99)
Potassium: 4.2 mmol/L (ref 3.5–5.1)
Sodium: 139 mmol/L (ref 135–145)
Total Bilirubin: 0.6 mg/dL (ref 0.0–1.2)
Total Protein: 7.1 g/dL (ref 6.5–8.1)

## 2024-02-09 LAB — PRO BRAIN NATRIURETIC PEPTIDE: Pro Brain Natriuretic Peptide: 378 pg/mL — ABNORMAL HIGH

## 2024-02-09 NOTE — Progress Notes (Signed)
 "  [facility] Note  Primary Care: Delores Rojelio Caldron, NP Primary Cardiologist: Wilbert Bihari, MD HF Cardiologist: Dr. Cherrie  Chief complaint: HF  HPI: [name] is a 69 y.o. male with a obesity, systolic HF due to NICM (normal coronary arteries by cath 1996), pulmonary sarcoidosis (biopsy proven), DM2, HTN,  OSA (intolerant of CPAP), CHB s/p dual chamber AICD and SVT s/p ablation.  Referred by Dr. Bihari for further evaluation of his HF.   Diagnosed with pulmonary sarcoid in 1992 or 1993 (biopsy proven). Treated with steroids for several months. Told it was burned out.  Had SVT ablation c/b CHB and underwent PPM placement   Cath in 1996. Normal coronary arteries.    Myoview  2011 EF 28% large inferior scar Echo 2013 EF 35-40%  Echo 2015 15-20%  Myoview  2017. EF 20% large scar Echo 2019 EF 15-20% Echo 1/23 EF 15-20%   [employer] BiV ICD implanted 1996, upgrade 2011, gen change 2020 for CHF (LV port capped). He has previously been offered CRT upgrade and has declined  R/LHC (4/23) showed normal cors, EF 20%, well compensated hemodynamics except for reduced PAPi.  Follow up 9/23, RV pacing 98%, referred to EP. Follow up 12/23 with EP to discuss CRT-D upgrade, unfortunately found to have occluded L SCV vein and unable to undergo BiV upgrade.  Echo 11/10/22: EF 20-25% dilated severe dysynchrony due to RV pacing   Today he returns for HF follow up. Overall feeling ok. Fatigued at times. Able to do ADLs. No edema, orthopnea or PND. Compliant with meds.   Cardiac Studies: - Echo 10/24: EF 20-25%, dilated severe dyssynchrony due to RV pacing  - Sleep study (6/23): revealed moderate sleep apnea.   - R/LHC (4/23): normal cors, Relatively well compensated hemodynamics except for reduced PAPi   The left ventricular ejection fraction is less than 25% by visual estimate. Ao = 107/62 (80) LV = 103/17 RA = 12 RV = 36/12 PA = 35/17 (24) PCW = 13 Fick cardiac  output/index = 7.9/3.0 PVR = 1.4 WU SVR = 665  FA sat = 98% PA sat = 71%, 74% PAPi = 1.5  - Echo 1/23 EF 15-20%  - Echo 2019 EF 15-20% - Myoview  2017. EF 20% large scar - Echo 2015 15-20%  - Echo 2013 EF 35-40%  - Myoview  2011 EF 28% large inferior scar  Past Medical History:  Diagnosis Date   Asthma    Automatic implantable cardioverter-defibrillator in situ    Back pain    Chronic systolic CHF (congestive heart failure), NYHA class 3 (HCC)    Complete heart block (HCC)    s/p PPM 1997 with upgrade to AICD 2011   Depression    Dilated aortic root    40mm ascending aortic root   DM (diabetes mellitus) (HCC)    Glaucoma    HTN (hypertension)    Hyperlipidemia    Nonischemic cardiomyopathy (HCC)    EF of 15-20% by echo 2015 and 20% by nuclear stress test 2017   Obesity    PAF (paroxysmal atrial fibrillation) (HCC)    13 minutes of PAF documented on ICD check 2018 - anticoagulated with Eliquis   Sarcoid    Sleep apnea 06-28-12   no cpap used-unable to tolerate   SVT (supraventricular tachycardia)    s/p ablation   Current Outpatient Medications  Medication Sig Dispense Refill   Aspirin -Caffeine (BC FAST PAIN RELIEF PO) Take 1 packet by mouth daily as needed (pain).  furosemide  (LASIX ) 40 MG tablet TAKE 1 TABLET BY MOUTH EVERY DAY 90 tablet 3   JARDIANCE 25 MG TABS tablet Take 25 mg by mouth daily.     MAGNESIUM PO Take 200 mg by mouth every other day.     metoprolol  succinate (TOPROL -XL) 50 MG 24 hr tablet Take 1 tablet (50 mg total) by mouth daily. PLEASE SCHEDULE APPOINTMENT FOR MORE REFILLS [id number] OPTION 2 90 tablet 0   RESTASIS 0.05 % ophthalmic emulsion Place 1 drop into both eyes 2 (two) times daily as needed (dry eyes).     rivaroxaban  (XARELTO ) 20 MG TABS tablet Take 1 tablet (20 mg total) by mouth daily with supper.     rosuvastatin (CRESTOR) 20 MG tablet Take 20 mg by mouth daily.     sacubitril -valsartan  (ENTRESTO ) 97-103 MG Take 1 tablet by mouth 2  (two) times daily. 180 tablet 3   spironolactone  (ALDACTONE ) 25 MG tablet TAKE 1 TABLET (25 MG TOTAL) BY MOUTH DAILY. 90 tablet 3   tiZANidine (ZANAFLEX) 4 MG capsule Take 4 mg by mouth at bedtime as needed for muscle spasms.     TRESIBA FLEXTOUCH 200 UNIT/ML SOPN Inject 40 Units into the skin every morning.     Vitamin D, Ergocalciferol, (DRISDOL) 1.25 MG (50000 UNIT) CAPS capsule Take 50,000 Units by mouth once a week. Takes on Sundays     zinc gluconate 50 MG tablet Take 50 mg by mouth every other day.     KLOR-CON  M20 20 MEQ tablet TAKE 1 TABLET (20 MEQ TOTAL) BY MOUTH AS NEEDED. WITH LASIX  DOSE ONLY (Patient not taking: Reported on 02/09/2024) 90 tablet 2   No current facility-administered medications for this encounter.   Facility-Administered Medications Ordered in Other Encounters  Medication Dose Route Frequency Provider Last Rate Last Admin   technetium pyrophosphate Tc 17m injection 21.1 millicurie  21.1 millicurie Intravenous Once Maranda Leim DEL, MD       Allergies  Allergen Reactions   Penicillins Other (See Comments)    Dizziness Did it involve swelling of the face/tongue/throat, SOB, or low BP? Yes Did it involve sudden or severe rash/hives, skin peeling, or any reaction on the inside of your mouth or nose? No Did you need to seek medical attention at a hospital or doctor's office? Yes When did it last happen?      childhood If all above answers are NO, may proceed with cephalosporin use.    Social History   Socioeconomic History   Marital status: Legally Separated    Spouse name: Not on file   Number of children: 2   Years of education: GED   Highest education level: Not on file  Occupational History   Not on file  Tobacco Use   Smoking status: Never   Smokeless tobacco: Never  Vaping Use   Vaping status: Never Used  Substance and Sexual Activity   Alcohol use: Not Currently    Comment: occasionally   Drug use: No   Sexual activity: Yes  Other Topics  Concern   Not on file  Social History Narrative   Not on file   Social Drivers of Health   Tobacco Use: Low Risk (02/09/2024)   Patient History    Smoking Tobacco Use: Never    Smokeless Tobacco Use: Never    Passive Exposure: Not on file  Financial Resource Strain: Low Risk (03/25/2021)   Overall Financial Resource Strain (CARDIA)    Difficulty of Paying Living Expenses: Not very hard  Food  Insecurity: No Food Insecurity (03/25/2021)   Hunger Vital Sign    Worried About Running Out of Food in the Last Year: Never true    Ran Out of Food in the Last Year: Never true  Transportation Needs: No Transportation Needs (03/25/2021)   PRAPARE - Administrator, Civil Service (Medical): No    Lack of Transportation (Non-Medical): No  Physical Activity: Not on file  Stress: Not on file  Social Connections: Not on file  Intimate Partner Violence: Not on file  Depression (PHQ2-9): Low Risk (01/25/2022)   Depression (PHQ2-9)    PHQ-2 Score: 1  Alcohol Screen: Not on file  Housing: Low Risk (03/25/2021)   Housing    Last Housing Risk Score: 0  Utilities: Not on file  Health Literacy: Not on file   Family History  Problem Relation Age of Onset   Diabetes Mother    Alzheimer's disease Mother    CVA Father    Heart failure Father    Epilepsy Sister    Diabetes Brother    Diabetes Sister    Wt Readings from Last 3 Encounters:  02/09/24 (!) 155.3 kg (342 lb 6.4 oz)  05/17/23 (!) 151.5 kg (334 lb)  05/10/23 (!) 153.6 kg (338 lb 9.6 oz)   BP 104/60   Pulse 71   Wt (!) 155.3 kg (342 lb 6.4 oz)   SpO2 97%   BMI 49.13 kg/m   PHYSICAL EXAM: General:  Sitting up in bed. No resp difficulty HEENT: normal Neck: supple. no JVD.  Cor: Regular rate & rhythm. No rubs, gallops or murmurs. Lungs: clear Abdomen: obese soft, nontender, nondistended.Good bowel sounds. Extremities: no cyanosis, clubbing, rash, edema Neuro: alert & orientedx3, cranial nerves grossly intact. moves all 4  extremities w/o difficulty. Affect pleasant   Device interrogation (personally reviewed): CorVue fluid up, 90% RVP, no AF/VT  ASSESSMENT & PLAN: 1.  Chronic systolic CHF  - longstanding LV dysfunction.  - s/p SJ BiV ICD (LV lead capped) - has refused upgrade to CRT - PYP scan 4/20 equivocal for amyloid. Urine and protein electrophoresis were normal. - Echo (1/23): EF 20% RV ok  - R/LHC (4/23) normal cors, EF 20% NICM, well compensated hemodynamics, except low PAPi; RA 12, PA 35/17 (24), PCW 13, CO/CI (Fick) 7.9/3.0, PAPi 1.5 - Doubt cardiac sarcoid, with wide QRS, suspect RV pacing CM (see below) - Echo 10/24: EF 20-25% dilated severe dysynchrony due to RV pacing  - NYHA III with ftigu. Volume up on ICD.  - Continue Entresto  97-103 mg bid. - Continue Toprol  XL 50 mg daily. - Continue spiro 25 mg daily. - Continue Jardiance 25 mg daily. - Volume up on Lasix  40 mg daily. Will double up x 2 days - Labs today - He has persistent, severe LV dysfunction. Echo suggestive of LBBB cardiomyopathy. Saw Dr Lucas who felt he is a candidate for re-do sternotomy if needed to pursue BiV upgrade with lead extraction. He saw Dr. Waddell and patient ultimately decided against it. We discussed it again today. He is not interested in CRT with epicardia patch.  - See back in 6 months with echo   2. HTN  - Blood pressure well controlled.  - Continue current regimen.  3. CHB   - Resulted from SVT ablation - he has STJ BiV ICD in place but LV lead capped. Has refused CRT upgrade in the past.  - Saw Dr Waddell regarding CRT-D, but L SCV occluded. Extraction and BiV upgrade  increased risk. See discussion above. EF still markedly depressed.    4. PAF  - he was noted on ICD check in 2018 to have PAF. - In NSR - Continue Xarelto   5. OSA - moderate by sleep study 6/23  - Unable to tolerate CPAP - Follows with Dr. Shlomo. - No change  6. Obesity - Body mass index is 49.13 kg/m. - Stopped Mounjaro 2/2  to constipation - Has lost about 15 pounds recently    Toribio Fuel, MD  2:20 PM  "

## 2024-02-09 NOTE — Patient Instructions (Signed)
 Medication Changes:  None, continue current medications  Lab Work:  Labs done today, your results will be available in MyChart, we will contact you for abnormal readings.  Testing/Procedures:  Your physician has requested that you have an echocardiogram. Echocardiography is a painless test that uses sound waves to create images of your heart. It provides your doctor with information about the size and shape of your heart and how well your hearts chambers and valves are working. This procedure takes approximately one hour. There are no restrictions for this procedure. Please do NOT wear cologne, perfume, aftershave, or lotions (deodorant is allowed). Please arrive 15 minutes prior to your appointment time. IN 6 MONTHS  Please note: We ask at that you not bring children with you during ultrasound (echo/ vascular) testing. Due to room size and safety concerns, children are not allowed in the ultrasound rooms during exams. Our front office staff cannot provide observation of children in our lobby area while testing is being conducted. An adult accompanying a patient to their appointment will only be allowed in the ultrasound room at the discretion of the ultrasound technician under special circumstances. We apologize for any inconvenience.   Special Instructions // Education:  Do the following things EVERYDAY: Weigh yourself in the morning before breakfast. Write it down and keep it in a log. Take your medicines as prescribed Eat low salt foods--Limit salt (sodium) to 2000 mg per day.  Stay as active as you can everyday Limit all fluids for the day to less than 2 liters   Follow-Up in: 6 months with an echocardiogram (July/Aug), *PLEASE CALL OUR OFFICE IN MAY TO SCHEDULE THESE APPOINTMENTS   At the [facility], you and your health needs are our priority. We have a designated team specialized in the treatment of Heart Failure. This Care Team includes your primary Heart  Failure Specialized Cardiologist (physician), Advanced Practice Providers (APPs- Physician Assistants and Nurse Practitioners), and Pharmacist who all work together to provide you with the care you need, when you need it.   You may see any of the following providers on your designated Care Team at your next follow up:  Dr. Toribio Fuel Dr. Ezra Shuck Dr. Odis Brownie Greig Mosses, NP Caffie Shed, [place] [facility] [place], [place] Beckey Coe, NP Jordan Lee, NP Tinnie Redman, PharmD   Please be sure to bring in all your medications bottles to every appointment.   Need to Contact Us :  If you have any questions or concerns before your next appointment please send us  a message through [place] or call our office at [phone].    TO LEAVE A MESSAGE FOR THE NURSE SELECT OPTION 2, PLEASE LEAVE A MESSAGE INCLUDING: YOUR NAME DATE OF BIRTH CALL BACK NUMBER REASON FOR CALL**this is important as we prioritize the call backs  YOU WILL RECEIVE A CALL BACK THE SAME DAY AS LONG AS YOU CALL BEFORE 4:00 PM

## 2024-02-09 NOTE — Addendum Note (Signed)
 Encounter addended by: Buell Powell HERO, RN on: 02/09/2024 2:35 PM  Actions taken: Visit diagnoses modified, Order list changed, Diagnosis association updated, Clinical Note Signed, Charge Capture section accepted

## 2024-02-23 ENCOUNTER — Ambulatory Visit

## 2024-05-24 ENCOUNTER — Ambulatory Visit

## 2024-08-23 ENCOUNTER — Ambulatory Visit

## 2024-11-22 ENCOUNTER — Ambulatory Visit
